# Patient Record
Sex: Female | Born: 1960
Health system: Southern US, Community
[De-identification: ages and names within clinical notes are randomized; demographics above are authoritative.]

## PROBLEM LIST (undated history)

## (undated) DIAGNOSIS — F329 Major depressive disorder, single episode, unspecified: Secondary | ICD-10-CM

## (undated) DIAGNOSIS — J302 Other seasonal allergic rhinitis: Secondary | ICD-10-CM

## (undated) DIAGNOSIS — Z8719 Personal history of other diseases of the digestive system: Secondary | ICD-10-CM

## (undated) DIAGNOSIS — F419 Anxiety disorder, unspecified: Secondary | ICD-10-CM

## (undated) DIAGNOSIS — E785 Hyperlipidemia, unspecified: Secondary | ICD-10-CM

## (undated) DIAGNOSIS — Z8709 Personal history of other diseases of the respiratory system: Secondary | ICD-10-CM

## (undated) DIAGNOSIS — E039 Hypothyroidism, unspecified: Secondary | ICD-10-CM

## (undated) DIAGNOSIS — I10 Essential (primary) hypertension: Secondary | ICD-10-CM

## (undated) DIAGNOSIS — Z803 Family history of malignant neoplasm of breast: Secondary | ICD-10-CM

## (undated) DIAGNOSIS — F32A Depression, unspecified: Secondary | ICD-10-CM

## (undated) DIAGNOSIS — Z923 Personal history of irradiation: Secondary | ICD-10-CM

## (undated) DIAGNOSIS — G4700 Insomnia, unspecified: Secondary | ICD-10-CM

## (undated) DIAGNOSIS — C50919 Malignant neoplasm of unspecified site of unspecified female breast: Secondary | ICD-10-CM

## (undated) DIAGNOSIS — Z8049 Family history of malignant neoplasm of other genital organs: Secondary | ICD-10-CM

## (undated) DIAGNOSIS — C801 Malignant (primary) neoplasm, unspecified: Secondary | ICD-10-CM

## (undated) DIAGNOSIS — K219 Gastro-esophageal reflux disease without esophagitis: Secondary | ICD-10-CM

## (undated) DIAGNOSIS — Z9221 Personal history of antineoplastic chemotherapy: Secondary | ICD-10-CM

## (undated) HISTORY — DX: Family history of malignant neoplasm of breast: Z80.3

## (undated) HISTORY — PX: ESOPHAGOGASTRODUODENOSCOPY: SHX1529

## (undated) HISTORY — PX: COLONOSCOPY: SHX174

## (undated) HISTORY — PX: BREAST BIOPSY: SHX20

## (undated) HISTORY — PX: TONSILLECTOMY: SUR1361

## (undated) HISTORY — DX: Family history of malignant neoplasm of other genital organs: Z80.49

---

## 1998-08-23 DIAGNOSIS — Z8709 Personal history of other diseases of the respiratory system: Secondary | ICD-10-CM

## 1998-08-23 HISTORY — DX: Personal history of other diseases of the respiratory system: Z87.09

## 1999-02-03 ENCOUNTER — Other Ambulatory Visit: Admission: RE | Admit: 1999-02-03 | Discharge: 1999-02-03 | Payer: Self-pay | Admitting: Obstetrics & Gynecology

## 2000-02-29 ENCOUNTER — Other Ambulatory Visit: Admission: RE | Admit: 2000-02-29 | Discharge: 2000-02-29 | Payer: Self-pay | Admitting: Obstetrics & Gynecology

## 2001-03-27 ENCOUNTER — Other Ambulatory Visit: Admission: RE | Admit: 2001-03-27 | Discharge: 2001-03-27 | Payer: Self-pay | Admitting: Obstetrics & Gynecology

## 2002-04-18 ENCOUNTER — Other Ambulatory Visit: Admission: RE | Admit: 2002-04-18 | Discharge: 2002-04-18 | Payer: Self-pay | Admitting: Obstetrics & Gynecology

## 2003-07-15 ENCOUNTER — Other Ambulatory Visit: Admission: RE | Admit: 2003-07-15 | Discharge: 2003-07-15 | Payer: Self-pay | Admitting: Obstetrics & Gynecology

## 2004-09-09 ENCOUNTER — Other Ambulatory Visit: Admission: RE | Admit: 2004-09-09 | Discharge: 2004-09-09 | Payer: Self-pay | Admitting: Obstetrics & Gynecology

## 2007-08-24 HISTORY — PX: HERNIA REPAIR: SHX51

## 2008-06-25 ENCOUNTER — Encounter: Admission: RE | Admit: 2008-06-25 | Discharge: 2008-06-25 | Payer: Self-pay | Admitting: General Surgery

## 2011-11-09 ENCOUNTER — Other Ambulatory Visit: Payer: Self-pay | Admitting: Internal Medicine

## 2011-11-09 DIAGNOSIS — K219 Gastro-esophageal reflux disease without esophagitis: Secondary | ICD-10-CM

## 2011-11-23 ENCOUNTER — Ambulatory Visit
Admission: RE | Admit: 2011-11-23 | Discharge: 2011-11-23 | Disposition: A | Discharge status: Home / Self Care | Payer: BC Managed Care – PPO | Source: Ambulatory Visit | Attending: Internal Medicine | Admitting: Internal Medicine

## 2011-11-23 ENCOUNTER — Other Ambulatory Visit: Payer: Self-pay | Admitting: Internal Medicine

## 2011-11-23 DIAGNOSIS — K219 Gastro-esophageal reflux disease without esophagitis: Secondary | ICD-10-CM

## 2014-02-28 ENCOUNTER — Encounter: Payer: Self-pay | Admitting: Podiatry

## 2014-02-28 ENCOUNTER — Ambulatory Visit (INDEPENDENT_AMBULATORY_CARE_PROVIDER_SITE_OTHER): Payer: BC Managed Care – PPO | Admitting: Podiatry

## 2014-02-28 VITALS — BP 124/69 | HR 85 | Resp 16 | Ht 68.0 in | Wt 207.0 lb

## 2014-02-28 DIAGNOSIS — L600 Ingrowing nail: Secondary | ICD-10-CM

## 2014-02-28 MED ORDER — NEOMYCIN-POLYMYXIN-HC 3.5-10000-1 OT SOLN: OTIC | Status: DC

## 2014-02-28 NOTE — Progress Notes (Signed)
   Subjective:    Patient ID: Tracy Hart, female    DOB: [birth year], 53 y.o.   MRN: [id number]  HPI Comments: "I have a bad toe"  Patient c/o aching 1st toe left, medial border, for a couple months. She has had ingrown toenail problems for years, even as a child. The area is swollen and red. She went to see PCP he Rx'd antibiotic. Some better. Has been trimming it out some herself.  Toe Pain       Review of Systems  HENT: Positive for sinus pressure.   Allergic/Immunologic: Positive for environmental allergies.  Psychiatric/Behavioral: The patient is nervous/anxious.   All other systems reviewed and are negative.      Objective:   Physical Exam: I have reviewed her past medical history medications allergies surgeries social history and review of systems. Pulses are strongly palpable bilateral. Neurologic sensorium is intact per Semmes-Weinstein monofilament. Deep tendon reflexes are brisk and equal bilateral. Muscle strength is 5 over 5 dorsiflexors plantar flexors inverters everters all intrinsic musculature is intact. Orthopedic evaluation demonstrates all joints distal to the ankle a full range of motion without crepitation. Cutaneous evaluation demonstrates a sharp incurvated nail margin along the tibial border of the hallux left. Hallux nail plate demonstrates onychocryptosis with erythema and edema and purulence along the tibial border.        Assessment & Plan:  Assessment: Ingrown nail paronychia abscess hallux left tibial border. Onychocryptosis hallux nail plate left.  Plan: Discussed etiology pathology conservative versus surgical therapies. She tolerated a tibial border matrixectomy well after local anesthesia was achieved. 3 applications of phenol were applied to the tibial matrix an a dressed a compressive dressing with Silvadene cream was applied. She was provided with home-going instructions for the care of her surgical time and I will followup with her in one  week.

## 2014-02-28 NOTE — Patient Instructions (Signed)

## 2014-03-12 ENCOUNTER — Encounter: Payer: Self-pay | Admitting: Podiatry

## 2014-03-12 ENCOUNTER — Ambulatory Visit (INDEPENDENT_AMBULATORY_CARE_PROVIDER_SITE_OTHER): Payer: BC Managed Care – PPO | Admitting: Podiatry

## 2014-03-12 DIAGNOSIS — L600 Ingrowing nail: Secondary | ICD-10-CM

## 2014-03-12 NOTE — Progress Notes (Signed)
She presents today one week status post matrixectomy tibial border hallux left. He continues to soak on a regular basis. Has discontinue Betadine start with Epsom salts. She states it feels much better had no pain whatsoever.  Objective: Vital signs are stable she is alert and oriented x3. Pulses are strongly palpable. No erythema edema cellulitis drainage or odor tibial border hallux left.  Assessment: Resolving matrixectomy appears to be healing well.  Plan: Continue soak Epsom salts warm water soaks until completely resolved continue her sport otic and covered in the day. Followup with me in a few days if necessary.

## 2014-08-23 DIAGNOSIS — C801 Malignant (primary) neoplasm, unspecified: Secondary | ICD-10-CM

## 2014-08-23 HISTORY — DX: Malignant (primary) neoplasm, unspecified: C80.1

## 2015-07-29 ENCOUNTER — Other Ambulatory Visit: Payer: Self-pay | Admitting: Obstetrics & Gynecology

## 2015-07-29 DIAGNOSIS — R928 Other abnormal and inconclusive findings on diagnostic imaging of breast: Secondary | ICD-10-CM

## 2015-08-04 ENCOUNTER — Ambulatory Visit
Admission: RE | Admit: 2015-08-04 | Discharge: 2015-08-04 | Disposition: A | Discharge status: Home / Self Care | Payer: BLUE CROSS/BLUE SHIELD | Source: Ambulatory Visit | Attending: Obstetrics & Gynecology | Admitting: Obstetrics & Gynecology

## 2015-08-04 ENCOUNTER — Other Ambulatory Visit: Payer: Self-pay | Admitting: Obstetrics & Gynecology

## 2015-08-04 DIAGNOSIS — R928 Other abnormal and inconclusive findings on diagnostic imaging of breast: Secondary | ICD-10-CM

## 2015-08-19 ENCOUNTER — Ambulatory Visit
Admission: RE | Admit: 2015-08-19 | Discharge: 2015-08-19 | Disposition: A | Discharge status: Home / Self Care | Payer: BLUE CROSS/BLUE SHIELD | Source: Ambulatory Visit | Attending: Obstetrics & Gynecology | Admitting: Obstetrics & Gynecology

## 2015-08-19 DIAGNOSIS — R928 Other abnormal and inconclusive findings on diagnostic imaging of breast: Secondary | ICD-10-CM

## 2015-08-19 DIAGNOSIS — C50919 Malignant neoplasm of unspecified site of unspecified female breast: Secondary | ICD-10-CM

## 2015-08-19 HISTORY — DX: Malignant neoplasm of unspecified site of unspecified female breast: C50.919

## 2015-08-21 ENCOUNTER — Other Ambulatory Visit: Payer: Self-pay | Admitting: Surgery

## 2015-08-21 DIAGNOSIS — C50911 Malignant neoplasm of unspecified site of right female breast: Secondary | ICD-10-CM

## 2015-08-22 ENCOUNTER — Telehealth: Payer: Self-pay | Admitting: Oncology

## 2015-08-22 ENCOUNTER — Other Ambulatory Visit: Payer: Self-pay | Admitting: Surgery

## 2015-08-22 DIAGNOSIS — C50911 Malignant neoplasm of unspecified site of right female breast: Secondary | ICD-10-CM

## 2015-08-22 NOTE — Telephone Encounter (Signed)
new breast appt-S/w patient and gave np appt for 01/11 @ 4 w/Dr. Jana Hakim Referring Dr. [name]  Referral information scanned

## 2015-08-24 HISTORY — PX: BREAST LUMPECTOMY: SHX2

## 2015-08-26 ENCOUNTER — Telehealth: Payer: Self-pay | Admitting: *Deleted

## 2015-08-26 NOTE — Telephone Encounter (Signed)
Mailed new pt packet to pt.  

## 2015-08-27 ENCOUNTER — Encounter (HOSPITAL_BASED_OUTPATIENT_CLINIC_OR_DEPARTMENT_OTHER)
Admission: RE | Admit: 2015-08-27 | Discharge: 2015-08-27 | Disposition: A | Discharge status: Home / Self Care | Payer: BLUE CROSS/BLUE SHIELD | Source: Ambulatory Visit | Attending: Surgery | Admitting: Surgery

## 2015-08-27 ENCOUNTER — Ambulatory Visit
Admission: RE | Admit: 2015-08-27 | Discharge: 2015-08-27 | Disposition: A | Discharge status: Home / Self Care | Payer: BLUE CROSS/BLUE SHIELD | Source: Ambulatory Visit | Attending: Surgery | Admitting: Surgery

## 2015-08-27 ENCOUNTER — Other Ambulatory Visit: Payer: Self-pay

## 2015-08-27 ENCOUNTER — Encounter (HOSPITAL_BASED_OUTPATIENT_CLINIC_OR_DEPARTMENT_OTHER): Payer: Self-pay | Admitting: *Deleted

## 2015-08-27 DIAGNOSIS — C50911 Malignant neoplasm of unspecified site of right female breast: Secondary | ICD-10-CM | POA: Insufficient documentation

## 2015-08-27 DIAGNOSIS — Z01818 Encounter for other preprocedural examination: Secondary | ICD-10-CM | POA: Insufficient documentation

## 2015-08-27 LAB — BASIC METABOLIC PANEL
ANION GAP: 8 (ref 5–15)
BUN: 12 mg/dL (ref 6–20)
CALCIUM: 9.5 mg/dL (ref 8.9–10.3)
CHLORIDE: 102 mmol/L (ref 101–111)
CO2: 28 mmol/L (ref 22–32)
CREATININE: 0.61 mg/dL (ref 0.44–1.00)
GFR calc non Af Amer: >60 mL/min (ref >60)
Glucose, Bld: 163 mg/dL — ABNORMAL HIGH (ref 65–99)
Potassium: 4.5 mmol/L (ref 3.5–5.1)
SODIUM: 138 mmol/L (ref 135–145)

## 2015-08-31 NOTE — H&P (Signed)
Tracy Hart 08/21/2015 11:29 AM Location: [facility] Patient #: [id number] DOB: February 16, 1961 Married / Language: Tracy Hart / Race: White Female   History of Present Illness ([name] MD; 08/21/2015 12:08 PM) Patient words: New-breast cancer.  The patient is a 55 year old female who presents with breast cancer. This is a pleasant female referred by Dr. Michiel Cowboy after the recent diagnosis of a right breast cancer. She had undergone screening mammography several weeks ago and a small 8 mm area was identified in the right breast in the upper outer quadrant. She had a stereotactic biopsy 2 days ago and this has revealed invasive mammary carcinoma. Receptor status is currently pending. She has no previous history of breast cancer or breast biopsies. She is otherwise healthy without complaints. Her mother had breast cancer at almost age 52 and her sisters had thyroid cancer.   Other Problems Malachi Bonds, CMA; 08/21/2015 11:29 AM) Anxiety Disorder Breast Cancer Gastroesophageal Reflux Disease High blood pressure Hypercholesterolemia Inguinal Hernia Thyroid Disease  Past Surgical History Malachi Bonds, CMA; 08/21/2015 11:29 AM) Open Inguinal Hernia Surgery Right. Tonsillectomy  Diagnostic Studies History Malachi Bonds, CMA; 08/21/2015 11:29 AM) Colonoscopy 5-10 years ago Mammogram within last year Pap Smear 1-5 years ago  Allergies Malachi Bonds, CMA; 08/21/2015 11:30 AM) Codeine Sulfate *ANALGESICS - OPIOID* Sulfamethoxazole *Sulfonamides  Medication History (Chemira Jones, CMA; 08/21/2015 11:32 AM) ALPRAZolam (0.5MG  Tablet, Oral) Active. Atorvastatin Calcium (40MG  Tablet, Oral) Active. Benicar HCT (40-25MG  Tablet, Oral) Active. Escitalopram Oxalate (10MG  Tablet, Oral) Active. Estradiol (0.05MG /24HR Patch TW, Transdermal) Active. Levothyroxine Sodium (137MCG Tablet, Oral) Active. Progesterone Micronized (100MG  Capsule,  Oral) Active. Fish Oil (1200MG  Capsule, Oral) Active. Medications Reconciled Aspirin (81MG  Tablet, Oral) Active.  Social History Malachi Bonds, CMA; 08/21/2015 11:29 AM) Alcohol use Occasional alcohol use. Caffeine use Carbonated beverages, Coffee. No drug use Tobacco use Former smoker.  Family History Malachi Bonds, CMA; 08/21/2015 11:29 AM) Breast Cancer Mother. Cancer Sister. Diabetes Mellitus Mother. Hypertension Father, Mother, Sister. Seizure disorder Sister. Thyroid problems Sister.  Pregnancy / Birth History Malachi Bonds, CMA; 08/21/2015 11:29 AM) Age at menarche 29 years. Age of menopause 49-55 Contraceptive History Oral contraceptives. Gravida 0 Irregular periods Para 0    Review of Systems (Chemira Jones CMA; 08/21/2015 11:29 AM) General Not Present- Appetite Loss, Chills, Fatigue, Fever, Night Sweats, Weight Gain and Weight Loss. Skin Not Present- Change in Wart/Mole, Dryness, Hives, Jaundice, New Lesions, Non-Healing Wounds, Rash and Ulcer. HEENT Not Present- Earache, Hearing Loss, Hoarseness, Nose Bleed, Oral Ulcers, Ringing in the Ears, Seasonal Allergies, Sinus Pain, Sore Throat, Visual Disturbances, Wears glasses/contact lenses and Yellow Eyes. Respiratory Not Present- Bloody sputum, Chronic Cough, Difficulty Breathing, Snoring and Wheezing. Breast Present- Breast Mass. Not Present- Breast Pain, Nipple Discharge and Skin Changes. Cardiovascular Not Present- Chest Pain, Difficulty Breathing Lying Down, Leg Cramps, Palpitations, Rapid Heart Rate, Shortness of Breath and Swelling of Extremities. Gastrointestinal Not Present- Abdominal Pain, Bloating, Bloody Stool, Change in Bowel Habits, Chronic diarrhea, Constipation, Difficulty Swallowing, Excessive gas, Gets full quickly at meals, Hemorrhoids, Indigestion, Nausea, Rectal Pain and Vomiting. Female Genitourinary Not Present- Frequency, Nocturia, Painful Urination, Pelvic Pain and  Urgency. Musculoskeletal Not Present- Back Pain, Joint Pain, Joint Stiffness, Muscle Pain, Muscle Weakness and Swelling of Extremities. Neurological Not Present- Decreased Memory, Fainting, Headaches, Numbness, Seizures, Tingling, Tremor, Trouble walking and Weakness. Psychiatric Not Present- Anxiety, Bipolar, Change in Sleep Pattern, Depression, Fearful and Frequent crying. Endocrine Not Present- Cold Intolerance, Excessive Hunger, Hair Changes, Heat Intolerance, Hot flashes and New Diabetes.  Hematology Not Present- Easy Bruising, Excessive bleeding, Gland problems, HIV and Persistent Infections.  Vitals (Chemira Jones CMA; 08/21/2015 11:30 AM) 08/21/2015 11:30 AM Weight: 212 lb Height: 68in Body Surface Area: 2.1 m Body Mass Index: 32.23 kg/m  Temp.: 98.37F(Oral)  Pulse: 96 (Regular)  BP: 140/90 (Sitting, Left Arm, Standard)       Physical Exam (Annette Liotta A. Ninfa Linden MD; 08/21/2015 12:08 PM) General Mental Status-Alert. General Appearance-Consistent with stated age. Hydration-Well hydrated. Voice-Normal.  Head and Neck Head-normocephalic, atraumatic with no lesions or palpable masses. Trachea-midline. Thyroid Gland Characteristics - normal size and consistency.  Eye Eyeball - Bilateral-Extraocular movements intact. Sclera/Conjunctiva - Bilateral-No scleral icterus.  Chest and Lung Exam Chest and lung exam reveals -quiet, even and easy respiratory effort with no use of accessory muscles and on auscultation, normal breath sounds, no adventitious sounds and normal vocal resonance. Inspection Chest Wall - Normal. Back - normal.  Breast Breast - Left-Symmetric, Non Tender, No Biopsy scars, no Dimpling, No Inflammation, No Lumpectomy scars, No Mastectomy scars, No Peau d' Orange. Breast - Right-Symmetric, Non Tender, No Biopsy scars, no Dimpling, No Inflammation, No Lumpectomy scars, No Mastectomy scars, No Peau d' Orange. Breast Lump-No  Palpable Breast Mass.  Cardiovascular Cardiovascular examination reveals -normal heart sounds, regular rate and rhythm with no murmurs and normal pedal pulses bilaterally.  Abdomen Inspection Inspection of the abdomen reveals - No Hernias. Skin - Scar - no surgical scars. Palpation/Percussion Palpation and Percussion of the abdomen reveal - Soft, Non Tender, No Rebound tenderness, No Rigidity (guarding) and No hepatosplenomegaly. Auscultation Auscultation of the abdomen reveals - Bowel sounds normal.  Neurologic Neurologic evaluation reveals -alert and oriented x 3 with no impairment of recent or remote memory. Mental Status-Normal.  Musculoskeletal Normal Exam - Left-Upper Extremity Strength Normal and Lower Extremity Strength Normal. Normal Exam - Right-Upper Extremity Strength Normal and Lower Extremity Strength Normal.  Lymphatic Head & Neck  General Head & Neck Lymphatics: Bilateral - Description - Normal. Axillary  General Axillary Region: Bilateral - Description - Normal. Tenderness - Non Tender. Femoral & Inguinal  Generalized Femoral & Inguinal Lymphatics: Bilateral - Description - Normal. Tenderness - Non Tender.    Assessment & Plan (Nimsi Males A. Ninfa Linden MD; 08/21/2015 12:10 PM) BREAST CANCER, STAGE 1, RIGHT (C50.911) Impression: I discussed the diagnosis with her in detail. I discussed surgery for breast cancer. I discussed breast conservation versus mastectomy. She is interested in breast conservation. I discussed right breast radioactive seed localized lumpectomy with sentinel lymph node biopsy. I discussed preoperative referral to medical and radiation oncology as well as genetics. At this point, she was to go ahead and schedule surgery ASAP. She has no children of her own. I discussed right breast radioactive seed localization with sentinel node biopsy in detail. I discussed the risks of surgery which includes but is not limited to bleeding, infection,  need for further surgery if margins or nodes are positive, cardiopulmonary issues, DVT, postoperative recovery, etc. She understands and wishes to proceed with surgery which will be scheduled along with the referrals    Signed by Harl Bowie, MD (08/21/2015 12:10 PM)

## 2015-09-01 ENCOUNTER — Ambulatory Visit (HOSPITAL_BASED_OUTPATIENT_CLINIC_OR_DEPARTMENT_OTHER)
Admission: RE | Admit: 2015-09-01 | Discharge: 2015-09-01 | Disposition: A | Discharge status: Home / Self Care | Payer: BLUE CROSS/BLUE SHIELD | Source: Ambulatory Visit | Attending: Surgery | Admitting: Surgery

## 2015-09-01 ENCOUNTER — Encounter (HOSPITAL_BASED_OUTPATIENT_CLINIC_OR_DEPARTMENT_OTHER)
Admission: RE | Disposition: A | Discharge status: Home / Self Care | Payer: Self-pay | Source: Ambulatory Visit | Attending: Surgery

## 2015-09-01 ENCOUNTER — Ambulatory Visit (HOSPITAL_COMMUNITY)
Admission: RE | Admit: 2015-09-01 | Discharge: 2015-09-01 | Disposition: A | Discharge status: Home / Self Care | Payer: BLUE CROSS/BLUE SHIELD | Source: Ambulatory Visit | Attending: Surgery | Admitting: Surgery

## 2015-09-01 ENCOUNTER — Encounter (HOSPITAL_BASED_OUTPATIENT_CLINIC_OR_DEPARTMENT_OTHER): Payer: Self-pay | Admitting: Anesthesiology

## 2015-09-01 ENCOUNTER — Ambulatory Visit (HOSPITAL_BASED_OUTPATIENT_CLINIC_OR_DEPARTMENT_OTHER): Payer: BLUE CROSS/BLUE SHIELD | Admitting: Anesthesiology

## 2015-09-01 ENCOUNTER — Ambulatory Visit
Admission: RE | Admit: 2015-09-01 | Discharge: 2015-09-01 | Disposition: A | Discharge status: Home / Self Care | Payer: BLUE CROSS/BLUE SHIELD | Source: Ambulatory Visit | Attending: Surgery | Admitting: Surgery

## 2015-09-01 DIAGNOSIS — Z7982 Long term (current) use of aspirin: Secondary | ICD-10-CM | POA: Insufficient documentation

## 2015-09-01 DIAGNOSIS — Z79890 Hormone replacement therapy (postmenopausal): Secondary | ICD-10-CM | POA: Diagnosis not present

## 2015-09-01 DIAGNOSIS — I10 Essential (primary) hypertension: Secondary | ICD-10-CM | POA: Insufficient documentation

## 2015-09-01 DIAGNOSIS — E7800 Pure hypercholesterolemia, unspecified: Secondary | ICD-10-CM | POA: Insufficient documentation

## 2015-09-01 DIAGNOSIS — Z87891 Personal history of nicotine dependence: Secondary | ICD-10-CM | POA: Insufficient documentation

## 2015-09-01 DIAGNOSIS — C50411 Malignant neoplasm of upper-outer quadrant of right female breast: Secondary | ICD-10-CM | POA: Diagnosis not present

## 2015-09-01 DIAGNOSIS — C50911 Malignant neoplasm of unspecified site of right female breast: Secondary | ICD-10-CM

## 2015-09-01 DIAGNOSIS — E039 Hypothyroidism, unspecified: Secondary | ICD-10-CM | POA: Insufficient documentation

## 2015-09-01 DIAGNOSIS — K219 Gastro-esophageal reflux disease without esophagitis: Secondary | ICD-10-CM | POA: Diagnosis not present

## 2015-09-01 DIAGNOSIS — Z803 Family history of malignant neoplasm of breast: Secondary | ICD-10-CM | POA: Insufficient documentation

## 2015-09-01 DIAGNOSIS — Z170 Estrogen receptor positive status [ER+]: Secondary | ICD-10-CM | POA: Diagnosis not present

## 2015-09-01 HISTORY — PX: BREAST LUMPECTOMY WITH RADIOACTIVE SEED AND SENTINEL LYMPH NODE BIOPSY: SHX6550

## 2015-09-01 HISTORY — DX: Hypothyroidism, unspecified: E03.9

## 2015-09-01 HISTORY — DX: Malignant (primary) neoplasm, unspecified: C80.1

## 2015-09-01 HISTORY — DX: Depression, unspecified: F32.A

## 2015-09-01 HISTORY — DX: Major depressive disorder, single episode, unspecified: F32.9

## 2015-09-01 HISTORY — DX: Essential (primary) hypertension: I10

## 2015-09-01 HISTORY — DX: Anxiety disorder, unspecified: F41.9

## 2015-09-01 SURGERY — BREAST LUMPECTOMY WITH RADIOACTIVE SEED AND SENTINEL LYMPH NODE BIOPSY
Anesthesia: General | Site: Breast | Laterality: Right

## 2015-09-01 MED ORDER — BUPIVACAINE-EPINEPHRINE (PF) 0.5% -1:200000 IJ SOLN
INTRAMUSCULAR | Status: AC
  Filled 2015-09-01: qty 30

## 2015-09-01 MED ORDER — MIDAZOLAM HCL 2 MG/2ML IJ SOLN
1.0000 mg | INTRAMUSCULAR | Status: DC | PRN
  Administered 2015-09-01 (×2): 2 mg via INTRAVENOUS

## 2015-09-01 MED ORDER — FENTANYL CITRATE (PF) 100 MCG/2ML IJ SOLN
50.0000 ug | INTRAMUSCULAR | Status: AC | PRN
  Administered 2015-09-01: 50 ug via INTRAVENOUS
  Administered 2015-09-01 (×4): 25 ug via INTRAVENOUS
  Administered 2015-09-01 (×3): 50 ug via INTRAVENOUS

## 2015-09-01 MED ORDER — MIDAZOLAM HCL 2 MG/2ML IJ SOLN
INTRAMUSCULAR | Status: AC
  Filled 2015-09-01: qty 2

## 2015-09-01 MED ORDER — EPHEDRINE SULFATE 50 MG/ML IJ SOLN
INTRAMUSCULAR | Status: AC
  Filled 2015-09-01: qty 1

## 2015-09-01 MED ORDER — KETOROLAC TROMETHAMINE 30 MG/ML IJ SOLN
INTRAMUSCULAR | Status: DC | PRN
  Administered 2015-09-01: 30 mg via INTRAVENOUS

## 2015-09-01 MED ORDER — BUPIVACAINE-EPINEPHRINE 0.5% -1:200000 IJ SOLN
INTRAMUSCULAR | Status: DC | PRN
  Administered 2015-09-01: 7 mL

## 2015-09-01 MED ORDER — ARTIFICIAL TEARS OP OINT
TOPICAL_OINTMENT | OPHTHALMIC | Status: AC
  Filled 2015-09-01: qty 3.5

## 2015-09-01 MED ORDER — METHYLENE BLUE 1 % INJ SOLN
INTRAMUSCULAR | Status: AC
  Filled 2015-09-01: qty 10

## 2015-09-01 MED ORDER — ONDANSETRON HCL 4 MG/2ML IJ SOLN
INTRAMUSCULAR | Status: AC
  Filled 2015-09-01: qty 2

## 2015-09-01 MED ORDER — OXYCODONE HCL 5 MG PO TABS
ORAL_TABLET | ORAL | Status: AC
  Filled 2015-09-01: qty 1

## 2015-09-01 MED ORDER — LIDOCAINE HCL (CARDIAC) 20 MG/ML IV SOLN
INTRAVENOUS | Status: AC
  Filled 2015-09-01: qty 5

## 2015-09-01 MED ORDER — SODIUM CHLORIDE 0.9 % IJ SOLN: 3.0000 mL | Freq: Two times a day (BID) | INTRAMUSCULAR | Status: DC

## 2015-09-01 MED ORDER — GLYCOPYRROLATE 0.2 MG/ML IJ SOLN: 0.2000 mg | Freq: Once | INTRAMUSCULAR | Status: DC | PRN

## 2015-09-01 MED ORDER — FENTANYL CITRATE (PF) 100 MCG/2ML IJ SOLN
INTRAMUSCULAR | Status: AC
  Filled 2015-09-01: qty 2

## 2015-09-01 MED ORDER — SODIUM CHLORIDE 0.9 % IJ SOLN: 3.0000 mL | INTRAMUSCULAR | Status: DC | PRN

## 2015-09-01 MED ORDER — CEFAZOLIN SODIUM-DEXTROSE 2-3 GM-% IV SOLR
2.0000 g | INTRAVENOUS | Status: AC
Start: 2015-09-01 — End: 2015-09-01
  Administered 2015-09-01: 2 g via INTRAVENOUS

## 2015-09-01 MED ORDER — PROMETHAZINE HCL 25 MG/ML IJ SOLN: 6.2500 mg | INTRAMUSCULAR | Status: DC | PRN

## 2015-09-01 MED ORDER — LIDOCAINE HCL (CARDIAC) 20 MG/ML IV SOLN
INTRAVENOUS | Status: DC | PRN
  Administered 2015-09-01: 30 mg via INTRAVENOUS

## 2015-09-01 MED ORDER — SODIUM CHLORIDE 0.9 % IV SOLN: 250.0000 mL | INTRAVENOUS | Status: DC | PRN

## 2015-09-01 MED ORDER — ATROPINE SULFATE 0.4 MG/ML IJ SOLN
INTRAMUSCULAR | Status: AC
  Filled 2015-09-01: qty 1

## 2015-09-01 MED ORDER — GLYCOPYRROLATE 0.2 MG/ML IJ SOLN
INTRAMUSCULAR | Status: AC
  Filled 2015-09-01: qty 1

## 2015-09-01 MED ORDER — ONDANSETRON HCL 4 MG/2ML IJ SOLN
INTRAMUSCULAR | Status: DC | PRN
  Administered 2015-09-01: 4 mg via INTRAVENOUS

## 2015-09-01 MED ORDER — HYDROMORPHONE HCL 1 MG/ML IJ SOLN
INTRAMUSCULAR | Status: AC
  Filled 2015-09-01: qty 1

## 2015-09-01 MED ORDER — ACETAMINOPHEN 650 MG RE SUPP: 650.0000 mg | RECTAL | Status: DC | PRN

## 2015-09-01 MED ORDER — ACETAMINOPHEN 325 MG PO TABS: 650.0000 mg | ORAL_TABLET | ORAL | Status: DC | PRN

## 2015-09-01 MED ORDER — TECHNETIUM TC 99M SULFUR COLLOID FILTERED
1.0000 | Freq: Once | INTRAVENOUS | Status: AC | PRN
  Administered 2015-09-01: 1 via INTRADERMAL

## 2015-09-01 MED ORDER — SODIUM CHLORIDE 0.9 % IJ SOLN
INTRAMUSCULAR | Status: AC
  Filled 2015-09-01: qty 10

## 2015-09-01 MED ORDER — LACTATED RINGERS IV SOLN
INTRAVENOUS | Status: DC
  Administered 2015-09-01 (×2): via INTRAVENOUS

## 2015-09-01 MED ORDER — EPHEDRINE SULFATE 50 MG/ML IJ SOLN
INTRAMUSCULAR | Status: DC | PRN
  Administered 2015-09-01: 10 mg via INTRAVENOUS

## 2015-09-01 MED ORDER — OXYCODONE HCL 5 MG PO TABS
5.0000 mg | ORAL_TABLET | Freq: Once | ORAL | Status: AC
  Administered 2015-09-01: 5 mg via ORAL

## 2015-09-01 MED ORDER — DEXAMETHASONE SODIUM PHOSPHATE 4 MG/ML IJ SOLN
INTRAMUSCULAR | Status: DC | PRN
  Administered 2015-09-01: 10 mg via INTRAVENOUS

## 2015-09-01 MED ORDER — KETOROLAC TROMETHAMINE 30 MG/ML IJ SOLN
INTRAMUSCULAR | Status: AC
  Filled 2015-09-01: qty 1

## 2015-09-01 MED ORDER — PROPOFOL 10 MG/ML IV BOLUS
INTRAVENOUS | Status: AC
  Filled 2015-09-01: qty 40

## 2015-09-01 MED ORDER — SODIUM CHLORIDE 0.9 % IJ SOLN
INTRAMUSCULAR | Status: DC | PRN
  Administered 2015-09-01: 5 mL via INTRADERMAL

## 2015-09-01 MED ORDER — FENTANYL CITRATE (PF) 100 MCG/2ML IJ SOLN: 25.0000 ug | INTRAMUSCULAR | Status: DC | PRN

## 2015-09-01 MED ORDER — SCOPOLAMINE 1 MG/3DAYS TD PT72: 1.0000 | MEDICATED_PATCH | Freq: Once | TRANSDERMAL | Status: DC | PRN

## 2015-09-01 MED ORDER — DEXAMETHASONE SODIUM PHOSPHATE 10 MG/ML IJ SOLN
INTRAMUSCULAR | Status: AC
  Filled 2015-09-01: qty 1

## 2015-09-01 MED ORDER — SUCCINYLCHOLINE CHLORIDE 20 MG/ML IJ SOLN
INTRAMUSCULAR | Status: AC
  Filled 2015-09-01: qty 1

## 2015-09-01 MED ORDER — PHENYLEPHRINE HCL 10 MG/ML IJ SOLN
INTRAMUSCULAR | Status: AC
  Filled 2015-09-01: qty 1

## 2015-09-01 MED ORDER — TRAMADOL HCL 50 MG PO TABS: 50.0000 mg | ORAL_TABLET | Freq: Four times a day (QID) | ORAL | Status: DC | PRN

## 2015-09-01 MED ORDER — CEFAZOLIN SODIUM-DEXTROSE 2-3 GM-% IV SOLR
INTRAVENOUS | Status: AC
  Filled 2015-09-01: qty 50

## 2015-09-01 MED ORDER — PROPOFOL 10 MG/ML IV BOLUS
INTRAVENOUS | Status: DC | PRN
  Administered 2015-09-01: 200 mg via INTRAVENOUS

## 2015-09-01 MED ORDER — HYDROMORPHONE HCL 1 MG/ML IJ SOLN
0.2500 mg | INTRAMUSCULAR | Status: DC | PRN
  Administered 2015-09-01 (×4): 0.5 mg via INTRAVENOUS

## 2015-09-01 SURGICAL SUPPLY — 48 items
APPLIER CLIP 9.375 MED OPEN (MISCELLANEOUS)
BINDER BREAST XLRG (GAUZE/BANDAGES/DRESSINGS) ×3 IMPLANT
BLADE HEX COATED 2.75 (ELECTRODE) ×3 IMPLANT
BLADE SURG 15 STRL LF DISP TIS (BLADE) ×1 IMPLANT
BLADE SURG 15 STRL SS (BLADE) ×2
CANISTER SUCT 1200ML W/VALVE (MISCELLANEOUS) ×3 IMPLANT
CHLORAPREP W/TINT 26ML (MISCELLANEOUS) ×3 IMPLANT
CLIP APPLIE 9.375 MED OPEN (MISCELLANEOUS) IMPLANT
CLIP TI WIDE RED SMALL 6 (CLIP) ×3 IMPLANT
COVER BACK TABLE 60X90IN (DRAPES) ×3 IMPLANT
COVER MAYO STAND STRL (DRAPES) ×3 IMPLANT
COVER PROBE W GEL 5X96 (DRAPES) ×3 IMPLANT
DECANTER SPIKE VIAL GLASS SM (MISCELLANEOUS) IMPLANT
DEVICE DUBIN W/COMP PLATE 8390 (MISCELLANEOUS) ×3 IMPLANT
DRAPE LAPAROSCOPIC ABDOMINAL (DRAPES) ×3 IMPLANT
DRAPE UTILITY XL STRL (DRAPES) ×3 IMPLANT
ELECT REM PT RETURN 9FT ADLT (ELECTROSURGICAL) ×3
ELECTRODE REM PT RTRN 9FT ADLT (ELECTROSURGICAL) ×1 IMPLANT
GLOVE BIOGEL PI IND STRL 7.0 (GLOVE) ×1 IMPLANT
GLOVE BIOGEL PI INDICATOR 7.0 (GLOVE) ×2
GLOVE EXAM NITRILE LRG STRL (GLOVE) ×3 IMPLANT
GLOVE SURG SIGNA 7.5 PF LTX (GLOVE) ×3 IMPLANT
GLOVE SURG SS PI 6.5 STRL IVOR (GLOVE) ×3 IMPLANT
GOWN STRL REUS W/ TWL LRG LVL3 (GOWN DISPOSABLE) ×1 IMPLANT
GOWN STRL REUS W/ TWL XL LVL3 (GOWN DISPOSABLE) ×1 IMPLANT
GOWN STRL REUS W/TWL LRG LVL3 (GOWN DISPOSABLE) ×2
GOWN STRL REUS W/TWL XL LVL3 (GOWN DISPOSABLE) ×2
KIT MARKER MARGIN INK (KITS) ×3 IMPLANT
LIQUID BAND (GAUZE/BANDAGES/DRESSINGS) ×3 IMPLANT
NDL SAFETY ECLIPSE 18X1.5 (NEEDLE) ×1 IMPLANT
NEEDLE HYPO 18GX1.5 SHARP (NEEDLE) ×2
NEEDLE HYPO 25X1 1.5 SAFETY (NEEDLE) ×3 IMPLANT
NS IRRIG 1000ML POUR BTL (IV SOLUTION) ×6 IMPLANT
PACK BASIN DAY SURGERY FS (CUSTOM PROCEDURE TRAY) ×3 IMPLANT
PENCIL BUTTON HOLSTER BLD 10FT (ELECTRODE) ×3 IMPLANT
SLEEVE SCD COMPRESS KNEE MED (MISCELLANEOUS) ×3 IMPLANT
SPONGE GAUZE 4X4 12PLY STER LF (GAUZE/BANDAGES/DRESSINGS) IMPLANT
SPONGE LAP 4X18 X RAY DECT (DISPOSABLE) ×3 IMPLANT
SUT MNCRL AB 4-0 PS2 18 (SUTURE) ×3 IMPLANT
SUT SILK 2 0 SH (SUTURE) IMPLANT
SUT VIC AB 3-0 SH 27 (SUTURE) ×2
SUT VIC AB 3-0 SH 27X BRD (SUTURE) ×1 IMPLANT
SYR CONTROL 10ML LL (SYRINGE) ×6 IMPLANT
TOWEL OR 17X24 6PK STRL BLUE (TOWEL DISPOSABLE) ×3 IMPLANT
TOWEL OR NON WOVEN STRL DISP B (DISPOSABLE) IMPLANT
TUBE CONNECTING 20'X1/4 (TUBING) ×1
TUBE CONNECTING 20X1/4 (TUBING) ×2 IMPLANT
YANKAUER SUCT BULB TIP NO VENT (SUCTIONS) ×3 IMPLANT

## 2015-09-01 NOTE — Interval H&P Note (Signed)
History and Physical Interval Note:no change in H and P  09/01/2015 10:36 AM  [name]  has presented today for surgery, with the diagnosis of Right breast cancer  The various methods of treatment have been discussed with the patient and family. After consideration of risks, benefits and other options for treatment, the patient has consented to  Procedure(s): RIGHT BREAST LUMPECTOMY WITH RADIOACTIVE SEED LOCALIZED AND SENTINEL LYMPH NODE BIOPSY (Right) as a surgical intervention .  The patient's history has been reviewed, patient examined, no change in status, stable for surgery.  I have reviewed the patient's chart and labs.  Questions were answered to the patient's satisfaction.     Alicha Raspberry A

## 2015-09-01 NOTE — Anesthesia Procedure Notes (Signed)
Procedure Name: LMA Insertion Date/Time: 09/01/2015 10:53 AM Performed by: Toula Moos L Pre-anesthesia Checklist: Patient identified, Emergency Drugs available, Suction available and Patient being monitored Patient Re-evaluated:Patient Re-evaluated prior to inductionOxygen Delivery Method: Circle System Utilized Preoxygenation: Pre-oxygenation with 100% oxygen Intubation Type: IV induction Ventilation: Mask ventilation without difficulty LMA: LMA inserted LMA Size: 4.0 Number of attempts: 1 Airway Equipment and Method: Bite block Placement Confirmation: positive ETCO2 Tube secured with: Tape Dental Injury: Teeth and Oropharynx as per pre-operative assessment

## 2015-09-01 NOTE — Op Note (Signed)
RIGHT BREAST LUMPECTOMY WITH RADIOACTIVE SEED LOCALIZED AND SENTINEL LYMPH NODE BIOPSY  Procedure Note  Tracy Hart 09/01/2015   Pre-op Diagnosis: Right breast cancer     Post-op Diagnosis: same  Procedure(s): RIGHT BREAST PARTIAL MASTECTOMY WITH RADIOACTIVE SEED LOCALIZED AND SENTINEL LYMPH NODE BIOPSY  Surgeon(s): [name], MD  Anesthesia: General  Staff:  Circulator: Lisbeth Ply, RN Scrub Person: Lynelle Doctor, RN  Estimated Blood Loss: Minimal               Specimens: SENT TO PATH.            Kahla Risdon A   Date: 09/01/2015  Time: 11:53 AM

## 2015-09-01 NOTE — Anesthesia Preprocedure Evaluation (Signed)
Anesthesia Evaluation  Patient identified by MRN, date of birth, ID band Patient awake    Reviewed: Allergy & Precautions, NPO status , Patient's Chart, lab work & pertinent test results  Airway Mallampati: II  TM Distance: >3 FB Neck ROM: Full    Dental no notable dental hx.    Pulmonary neg pulmonary ROS,    Pulmonary exam normal breath sounds clear to auscultation       Cardiovascular hypertension, Pt. on medications Normal cardiovascular exam Rhythm:Regular Rate:Normal     Neuro/Psych negative neurological ROS  negative psych ROS   GI/Hepatic negative GI ROS, Neg liver ROS,   Endo/Other  Hypothyroidism   Renal/GU negative Renal ROS  negative genitourinary   Musculoskeletal negative musculoskeletal ROS (+)   Abdominal   Peds negative pediatric ROS (+)  Hematology negative hematology ROS (+)   Anesthesia Other Findings   Reproductive/Obstetrics negative OB ROS                             Anesthesia Physical Anesthesia Plan  ASA: II  Anesthesia Plan: General   Post-op Pain Management: GA combined w/ Regional for post-op pain   Induction: Intravenous  Airway Management Planned: LMA  Additional Equipment:   Intra-op Plan:   Post-operative Plan: Extubation in OR  Informed Consent: I have reviewed the patients History and Physical, chart, labs and discussed the procedure including the risks, benefits and alternatives for the proposed anesthesia with the patient or authorized representative who has indicated his/her understanding and acceptance.   Dental advisory given  Plan Discussed with: CRNA and Surgeon  Anesthesia Plan Comments:         Anesthesia Quick Evaluation

## 2015-09-01 NOTE — Progress Notes (Signed)
Assisted nuc med tech # [id number] with nuc med inj. Side rails up, monitors on throughout procedure. See vital signs in flow sheet. Tolerated Procedure well. 

## 2015-09-01 NOTE — Discharge Instructions (Signed)
[facility] °Office Phone Number [phone] ° °BREAST BIOPSY/ PARTIAL MASTECTOMY: POST OP INSTRUCTIONS ° °Always review your discharge instruction sheet given to you by the facility where your surgery was performed. ° °IF YOU HAVE DISABILITY OR FAMILY LEAVE FORMS, YOU MUST BRING THEM TO THE OFFICE FOR PROCESSING.  DO NOT GIVE THEM TO YOUR DOCTOR. ° °1. A prescription for pain medication may be given to you upon discharge.  Take your pain medication as prescribed, if needed.  If narcotic pain medicine is not needed, then you may take acetaminophen (Tylenol) or ibuprofen (Advil) as needed. °2. Take your usually prescribed medications unless otherwise directed °3. If you need a refill on your pain medication, please contact your pharmacy.  They will contact our office to request authorization.  Prescriptions will not be filled after 5pm or on week-ends. °4. You should eat very light the first 24 hours after surgery, such as soup, crackers, pudding, etc.  Resume your normal diet the day after surgery. °5. Most patients will experience some swelling and bruising in the breast.  Ice packs and a good support bra will help.  Swelling and bruising can take several days to resolve.  °6. It is common to experience some constipation if taking pain medication after surgery.  Increasing fluid intake and taking a stool softener will usually help or prevent this problem from occurring.  A mild laxative (Milk of Magnesia or Miralax) should be taken according to package directions if there are no bowel movements after 48 hours. °7. Unless discharge instructions indicate otherwise, you may remove your bandages 24-48 hours after surgery, and you may shower at that time.  You may have steri-strips (small skin tapes) in place directly over the incision.  These strips should be left on the skin for 7-10 days.  If your surgeon used skin glue on the incision, you may shower in 24 hours.  The glue will flake off over the  next 2-3 weeks.  Any sutures or staples will be removed at the office during your follow-up visit. °8. ACTIVITIES:  You may resume regular daily activities (gradually increasing) beginning the next day.  Wearing a good support bra or sports bra minimizes pain and swelling.  You may have sexual intercourse when it is comfortable. °a. You may drive when you no longer are taking prescription pain medication, you can comfortably wear a seatbelt, and you can safely maneuver your car and apply brakes. °b. RETURN TO WORK:  ______________________________________________________________________________________ °9. You should see your doctor in the office for a follow-up appointment approximately two weeks after your surgery.  Your doctor’s nurse will typically make your follow-up appointment when she calls you with your pathology report.  Expect your pathology report 2-3 business days after your surgery.  You may call to check if you do not hear from us after three days. °10. OTHER INSTRUCTIONS: _______________________________________________________________________________________________ _____________________________________________________________________________________________________________________________________ °_____________________________________________________________________________________________________________________________________ °_____________________________________________________________________________________________________________________________________ ° °WHEN TO CALL YOUR DOCTOR: °1. Fever over 101.0 °2. Nausea and/or vomiting. °3. Extreme swelling or bruising. °4. Continued bleeding from incision. °5. Increased pain, redness, or drainage from the incision. ° °The clinic staff is available to answer your questions during regular business hours.  Please don’t hesitate to call and ask to speak to one of the nurses for clinical concerns.  If you have a medical emergency, go to the nearest  emergency room or call 911.  A surgeon from [facility] is always on call at the hospital. ° °For further questions, please visit [email or web address]  ° ° ° °  Post Anesthesia Home Care Instructions ° °Activity: °Get plenty of rest for the remainder of the day. A responsible adult should stay with you for 24 hours following the procedure.  °For the next 24 hours, DO NOT: °-Drive a car °-Operate machinery °-Drink alcoholic beverages °-Take any medication unless instructed by your physician °-Make any legal decisions or sign important papers. ° °Meals: °Start with liquid foods such as gelatin or soup. Progress to regular foods as tolerated. Avoid greasy, spicy, heavy foods. If nausea and/or vomiting occur, drink only clear liquids until the nausea and/or vomiting subsides. Call your physician if vomiting continues. ° °Special Instructions/Symptoms: °Your throat may feel dry or sore from the anesthesia or the breathing tube placed in your throat during surgery. If this causes discomfort, gargle with warm salt water. The discomfort should disappear within 24 hours. ° °If you had a scopolamine patch placed behind your ear for the management of post- operative nausea and/or vomiting: ° °1. The medication in the patch is effective for 72 hours, after which it should be removed.  Wrap patch in a tissue and discard in the trash. Wash hands thoroughly with soap and water. °2. You may remove the patch earlier than 72 hours if you experience unpleasant side effects which may include dry mouth, dizziness or visual disturbances. °3. Avoid touching the patch. Wash your hands with soap and water after contact with the patch. °  ° °

## 2015-09-01 NOTE — Progress Notes (Signed)
Assisted Dr. Rose with right, ultrasound guided, pectoralis block. Side rails up, monitors on throughout procedure. See vital signs in flow sheet. Tolerated Procedure well. 

## 2015-09-01 NOTE — Transfer of Care (Signed)
Immediate Anesthesia Transfer of Care Note  Patient: Tracy Hart  Procedure(s) Performed: Procedure(s): RIGHT BREAST LUMPECTOMY WITH RADIOACTIVE SEED LOCALIZED AND SENTINEL LYMPH NODE BIOPSY (Right)  Patient Location: PACU  Anesthesia Type:GA combined with regional for post-op pain  Level of Consciousness: awake and patient cooperative  Airway & Oxygen Therapy: Patient Spontanous Breathing and Patient connected to face mask oxygen  Post-op Assessment: Report given to RN and Post -op Vital signs reviewed and stable  Post vital signs: Reviewed and stable  Last Vitals:  Filed Vitals:   09/01/15 1200 09/01/15 1202  BP: 133/79   Pulse:  98  Temp:    Resp:  14    Complications: No apparent anesthesia complications

## 2015-09-01 NOTE — Anesthesia Postprocedure Evaluation (Signed)
Anesthesia Post Note  Patient: Enrica Brannock  Procedure(s) Performed: Procedure(s) (LRB): RIGHT BREAST LUMPECTOMY WITH RADIOACTIVE SEED LOCALIZED AND SENTINEL LYMPH NODE BIOPSY (Right)  Patient location during evaluation: PACU Anesthesia Type: General and Regional Level of consciousness: awake and alert Pain management: pain level controlled Vital Signs Assessment: post-procedure vital signs reviewed and stable Respiratory status: spontaneous breathing and respiratory function stable Cardiovascular status: blood pressure returned to baseline and stable Postop Assessment: no signs of nausea or vomiting Anesthetic complications: no    Last Vitals:  Filed Vitals:   09/01/15 1215 09/01/15 1230  BP: 129/70 133/77  Pulse: 91 92  Temp:    Resp: 14 16    Last Pain:  Filed Vitals:   09/01/15 1236  PainSc: 5                  Jancie Kercher S

## 2015-09-02 ENCOUNTER — Other Ambulatory Visit: Payer: Self-pay | Admitting: *Deleted

## 2015-09-02 ENCOUNTER — Encounter (HOSPITAL_BASED_OUTPATIENT_CLINIC_OR_DEPARTMENT_OTHER): Payer: Self-pay | Admitting: Surgery

## 2015-09-02 DIAGNOSIS — C50919 Malignant neoplasm of unspecified site of unspecified female breast: Secondary | ICD-10-CM

## 2015-09-02 LAB — POCT HEMOGLOBIN-HEMACUE: HEMOGLOBIN: 15.2 g/dL — AB (ref 12.0–15.0)

## 2015-09-02 NOTE — Op Note (Signed)
[name], [name]               ACCOUNT NO.:  [id number]  MEDICAL RECORD NO.:  [id number]  LOCATION:  [facility]C                          FACILITY:  [facility]  PHYSICIAN:  [name], M.D. DATE OF BIRTH:  November 12, 1960  DATE OF PROCEDURE:  09/01/2015 DATE OF DISCHARGE:                              OPERATIVE REPORT   PREOPERATIVE DIAGNOSIS:  Right breast cancer.  POSTOPERATIVE DIAGNOSIS:  Right breast cancer.  PROCEDURES: 1. Right breast radioactive seed localized partial mastectomy. 2. Right axillary sentinel lymph node biopsy. 3. Injection of blue dye.  SURGEON:  [name], M.D.  ANESTHESIA:  General with 0.25% Marcaine and pectoral block applied by anesthesia.  ESTIMATED BLOOD LOSS:  Minimal.  INDICATIONS:  This is a 55 year old female, who was found to have an abnormality in the upper outer quadrant of the right breast on screening mammography.  Stereotactic biopsy performed showing invasive ductal carcinoma which is ER and PR positive.  The size of the lesions approximately 8 mm on ultrasound.  The decision was made after discussion to proceed with a radioactive seed localized right breast partial mastectomy and sentinel lymph node biopsy.  FINDINGS:  The partial mastectomy specimen was x-rayed and found to contain both the radioactive seed and marker clip.  Five separate sentinel lymph nodes were excised.  PROCEDURE IN DETAIL:  The patient was brought to the operating room, identified as Tracy Hart.  She had been identified in the holding area and radioactive isotope was injected underneath her nipple, and the radioactive seed was confirmed by the Neoprobe to be in the breast.  She was placed upon supine position on the operating room table and general anesthesia was induced.  I then prepped the right breast and injected blue dye underneath the nipple-areolar complex and massaged the breast. The patient's right breast and axilla were then prepped and draped  in usual sterile fashion.  I used the Neoprobe to identify an area increased uptake in the upper outer quadrant of the breast.  I anesthetized skin with Marcaine.  Made a longitudinal incision with a scalpel.  I then took this down to the breast tissue with electrocautery and performed a partial mastectomy removing the tissue in the upper outer quadrant going down to the chest wall.  This was done with the aid of the Neoprobe and was widely around the specimen.  Once the entire partial mastectomy specimen was removed, I again confirmed that the seed was in the specimen.  I marked all margins with marker paint, and x-ray confirmed that the radioactive seed and previously placed tissue marker were in the specimen.  This was then sent to Pathology for identification.  I then used the Neoprobe to evaluate the axilla through the same incision in the upper outer quadrant.  With the aid of Neoprobe and blue dye, I was able to identify 5 sentinel lymph nodes which were quite deep in the axilla.  The nodes do contain blue dye.  All the nodes were excised and sent to Pathology for evaluation.  I then examined the nodal basin and found no other increased uptake of radioactive isotope. Hemostasis then appeared to be achieved with the cautery.  I placed surgical  clips at the partial mastectomy site.  I then closed subcutaneous tissue with interrupted 3-0 Vicryl sutures and closed the skin with a running 4-0 Monocryl.  Skin glue was then applied.  The patient tolerated the procedure well.  All the counts were correct at the end of the procedure.  The patient was then extubated in the operating room and taken in a stable condition to the recovery room.     [name], M.D.     DB/MEDQ  D:  09/01/2015  T:  09/02/2015  Job:  [id number]

## 2015-09-03 ENCOUNTER — Ambulatory Visit (HOSPITAL_BASED_OUTPATIENT_CLINIC_OR_DEPARTMENT_OTHER): Payer: BLUE CROSS/BLUE SHIELD | Admitting: Oncology

## 2015-09-03 ENCOUNTER — Other Ambulatory Visit (HOSPITAL_BASED_OUTPATIENT_CLINIC_OR_DEPARTMENT_OTHER): Payer: BLUE CROSS/BLUE SHIELD

## 2015-09-03 VITALS — BP 129/80 | HR 88 | Temp 98.5°F | Resp 18 | Ht 68.0 in | Wt 215.0 lb

## 2015-09-03 DIAGNOSIS — C50411 Malignant neoplasm of upper-outer quadrant of right female breast: Secondary | ICD-10-CM | POA: Diagnosis not present

## 2015-09-03 DIAGNOSIS — Z170 Estrogen receptor positive status [ER+]: Secondary | ICD-10-CM | POA: Diagnosis not present

## 2015-09-03 DIAGNOSIS — C50919 Malignant neoplasm of unspecified site of unspecified female breast: Secondary | ICD-10-CM

## 2015-09-03 LAB — CBC WITH DIFFERENTIAL/PLATELET
BASO%: 0.9 % (ref 0.0–2.0)
BASOS ABS: 0.1 10*3/uL (ref 0.0–0.1)
EOS ABS: 0.3 10*3/uL (ref 0.0–0.5)
EOS%: 2.7 % (ref 0.0–7.0)
HEMATOCRIT: 38.1 % (ref 34.8–46.6)
HEMOGLOBIN: 13 g/dL (ref 11.6–15.9)
LYMPH#: 3.7 10*3/uL — AB (ref 0.9–3.3)
LYMPH%: 34.7 % (ref 14.0–49.7)
MCH: 34.2 pg — AB (ref 25.1–34.0)
MCHC: 34 g/dL (ref 31.5–36.0)
MCV: 100.5 fL (ref 79.5–101.0)
MONO#: 0.9 10*3/uL (ref 0.1–0.9)
MONO%: 8.1 % (ref 0.0–14.0)
NEUT#: 5.8 10*3/uL (ref 1.5–6.5)
NEUT%: 53.6 % (ref 38.4–76.8)
PLATELETS: 269 10*3/uL (ref 145–400)
RBC: 3.79 10*6/uL (ref 3.70–5.45)
RDW: 12.1 % (ref 11.2–14.5)
WBC: 10.8 10*3/uL — ABNORMAL HIGH (ref 3.9–10.3)

## 2015-09-03 LAB — COMPREHENSIVE METABOLIC PANEL
ALBUMIN: 3.9 g/dL (ref 3.5–5.0)
ALK PHOS: 83 U/L (ref 40–150)
ALT: 31 U/L (ref 0–55)
ANION GAP: 9 meq/L (ref 3–11)
AST: 30 U/L (ref 5–34)
BUN: 15.6 mg/dL (ref 7.0–26.0)
CALCIUM: 9.2 mg/dL (ref 8.4–10.4)
CHLORIDE: 100 meq/L (ref 98–109)
CO2: 27 mEq/L (ref 22–29)
Creatinine: 0.9 mg/dL (ref 0.6–1.1)
EGFR: 75 mL/min/{1.73_m2} — AB (ref >90)
Glucose: 102 mg/dl (ref 70–140)
POTASSIUM: 4.2 meq/L (ref 3.5–5.1)
Sodium: 135 mEq/L — ABNORMAL LOW (ref 136–145)
Total Bilirubin: 0.43 mg/dL (ref 0.20–1.20)
Total Protein: 7.6 g/dL (ref 6.4–8.3)

## 2015-09-03 NOTE — Progress Notes (Signed)
[facility]  [phone] [phone]     ID: Tracy Hart DOB: [birth year]  MR#: [id number]  GLO#:[id number]  Patient Care Team: Prince Solian, MD as PCP - General (Internal Medicine) [name] [name], MD as Consulting Physician (Obstetrics and Gynecology) [name], MD as Consulting Physician (Oncology) [name], MD as Consulting Physician (General Surgery) [name], MD as Consulting Physician (Radiation Oncology) PCP: [name], MD OTHER MD:  CHIEF COMPLAINT: Estrogen receptor positive breast cancer  CURRENT TREATMENT: Adjuvant treatment pending   BREAST CANCER HISTORY: "Tracy Hart" had routine screening mammography at Dr. [name] office late November, showing a possible mass in the right breast. On 08/04/2015 she underwent right diagnostic mammography with tomosynthesis and right breast ultrasonography at the breast Center. The breast density was category C. In the upper outer quadrant of the right breast there was a 0.8 cm area of asymmetry which was not palpable by exam. Ultrasound showed no suspicious mass or calcifications.  Biopsy of the area of asymmetry was performed 08/19/2015, and showed ([id number]) invasive and in situ ductal carcinoma, E-cadherin positive, estrogen receptor 90% positive, progesterone receptor 90% positive, both with strong staining intensity, with an MIB-1 of 5%, and no HER-2 amplification, the signals ratio being 1.44 and the number per cell 1.80.  The patient was then referred to surgery and after appropriate discussion underwent right lumpectomy and sentinel lymph node sampling 09/01/2015. The pathology from this procedure ([id number]) confirmed an invasive ductal carcinoma, grade 2, measuring 1.7 cm, with some low-grade ductal carcinoma in situ. Margins were negative. All 5 sentinel lymph nodes were clear.  The patient's subsequent history is as detailed below.  INTERVAL HISTORY: Tracy Hart was  evaluated in the breast clinic 09/03/2015 accompanied by her husband Octavia Bruckner and her sister Sharee Pimple  REVIEW OF SYSTEMS: There were no specific symptoms leading to the original mammogram, which was routinely scheduled. The patient denies unusual headaches, visual changes, nausea, vomiting, stiff neck, dizziness, or gait imbalance. There has been no cough, phlegm production, or pleurisy, no chest pain or pressure, and no change in bowel or bladder habits. The patient denies fever, rash, bleeding, unexplained fatigue or unexplained weight loss. Vickey tolerated her recent surgery well, with no unusual pain, fever, or bleeding. She admits to mild seasonal allergies. She exercises by walking usually 30 minutes 5 times a week. A detailed review of systems was otherwise entirely negative.  PAST MEDICAL HISTORY: Past Medical History  Diagnosis Date  . Hypertension   . Hypothyroidism   . Depression   . Anxiety   . Cancer [facility]) 2016    right breast    PAST SURGICAL HISTORY: Past Surgical History  Procedure Laterality Date  . Hernia repair Right 2009  . Breast lumpectomy with radioactive seed and sentinel lymph node biopsy Right 09/01/2015    Procedure: RIGHT BREAST LUMPECTOMY WITH RADIOACTIVE SEED LOCALIZED AND SENTINEL LYMPH NODE BIOPSY;  Surgeon: [name], MD;  Location: [facility];  Service: General;  Laterality: Right;    FAMILY HISTORY No family history on file. The patient's parents are in their early 104s as of January 2017. The patient had no brothers, 3 sisters. The patient's mother was diagnosed with breast cancer at age 11. The patient's sister was diagnosed with thyroid cancer at age 72. There is no history of ovarian cancer in the family.   GYNECOLOGIC HISTORY:  No LMP recorded. Patient is postmenopausal. Menarche age 56, the patient is GX P0. She was on oral contraceptives  between 1996 and 2016, and at the time of her diagnosis had been on hormone replacement for 2  months.   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 Her husband [name]") works in Occupational hygienist for a Technical sales engineer. At home is just the 2 of them, with no pets. The patient at tends [place]    ADVANCED DIRECTIVES: In place. The patient's husband is her healthcare power of attorney with her sister [name] listed as secondary   HEALTH MAINTENANCE: Social History  Substance Use Topics  . Smoking status: Never Smoker   . Smokeless tobacco: Not on file  . Alcohol Use: Yes     Comment: social     Colonoscopy: 2010/[facility]  PAP: November 2016  Bone density: Due/ at Dr [name]  Lipid panel:  Allergies  Allergen Reactions  . Codeine     No percocet, makes patient weird  . Sulfa Antibiotics     Current Outpatient Prescriptions  Medication Sig Dispense Refill  . ALPRAZolam (XANAX) 0.5 MG tablet Take 0.5 mg by mouth at bedtime as needed for anxiety.    Marland Kitchen aspirin 81 MG tablet Take 81 mg by mouth daily.    Marland Kitchen atorvastatin (LIPITOR) 40 MG tablet Take 40 mg by mouth daily.    . calcium carbonate (OS-CAL) 600 MG TABS tablet Take 600 mg by mouth 2 (two) times daily with a meal.    . cetirizine (ZYRTEC) 10 MG tablet Take 10 mg by mouth daily.    . cholecalciferol (VITAMIN D) 400 UNITS TABS tablet Take 800 Units by mouth 2 (two) times daily.    Marland Kitchen escitalopram (LEXAPRO) 10 MG tablet Take 10 mg by mouth daily.    Marland Kitchen estradiol (CLIMARA - DOSED IN MG/24 HR) 0.05 mg/24hr patch Place 0.05 mg onto the skin once a week.    . fluticasone (FLONASE) 50 MCG/ACT nasal spray Place 1 spray into both nostrils daily.    Marland Kitchen levothyroxine (SYNTHROID, LEVOTHROID) 137 MCG tablet Take 137 mcg by mouth daily before breakfast.    . norgestrel-ethinyl estradiol (LO/OVRAL,CRYSELLE) 0.3-30 MG-MCG tablet Take 1 tablet by mouth daily.    Marland Kitchen olmesartan-hydrochlorothiazide (BENICAR HCT) 40-25 MG per tablet Take 1 tablet by mouth daily.    . Omega-3  Fatty Acids (FISH OIL) 1200 MG CAPS Take by mouth 2 (two) times daily.    . progesterone (PROMETRIUM) 100 MG capsule Take 100 mg by mouth daily.    . traMADol (ULTRAM) 50 MG tablet Take 1-2 tablets (50-100 mg total) by mouth every 6 (six) hours as needed. 30 tablet 0  . traZODone (DESYREL) 50 MG tablet Take 50 mg by mouth at bedtime.     No current facility-administered medications for this visit.    OBJECTIVE: Middle-aged white woman who appears younger than stated age  58 Vitals:   09/03/15 1615  BP: 129/80  Pulse: 88  Temp: 98.5 F (36.9 C)  Resp: 18     Body mass index is 32.7 kg/(m^2).    ECOG FS:0 - Asymptomatic  Ocular: Sclerae unicteric, pupils equal, round and reactive to light Ear-nose-throat: Oropharynx clear and moist Lymphatic: No cervical or supraclavicular adenopathy Lungs no rales or rhonchi, good excursion bilaterally Heart regular rate and rhythm, no murmur appreciated Abd soft, nontender, positive bowel sounds MSK no focal spinal tenderness, no joint edema Neuro: non-focal, well-oriented, appropriate affect Breasts: The right breast is status post recent lumpectomy. The cosmetic result is excellent.  The incision is healing nicely, with no dehiscence, erythema, or local swelling. The right breast is minimally edematous. The right axilla is benign. The left breast is unremarkable   LAB RESULTS:  CMP     Component Value Date/Time   NA 135* 09/03/2015 1551   NA 138 08/27/2015 1225   K 4.2 09/03/2015 1551   K 4.5 08/27/2015 1225   CL 102 08/27/2015 1225   CO2 27 09/03/2015 1551   CO2 28 08/27/2015 1225   GLUCOSE 102 09/03/2015 1551   GLUCOSE 163* 08/27/2015 1225   BUN 15.6 09/03/2015 1551   BUN 12 08/27/2015 1225   CREATININE 0.9 09/03/2015 1551   CREATININE 0.61 08/27/2015 1225   CALCIUM 9.2 09/03/2015 1551   CALCIUM 9.5 08/27/2015 1225   PROT 7.6 09/03/2015 1551   ALBUMIN 3.9 09/03/2015 1551   AST 30 09/03/2015 1551   ALT 31 09/03/2015 1551    ALKPHOS 83 09/03/2015 1551   BILITOT 0.43 09/03/2015 1551   GFRNONAA >60 08/27/2015 1225   GFRAA >60 08/27/2015 1225    INo results found for: SPEP, UPEP  Lab Results  Component Value Date   WBC 10.8* 09/03/2015   NEUTROABS 5.8 09/03/2015   HGB 13.0 09/03/2015   HCT 38.1 09/03/2015   MCV 100.5 09/03/2015   PLT 269 09/03/2015      Chemistry      Component Value Date/Time   NA 135* 09/03/2015 1551   NA 138 08/27/2015 1225   K 4.2 09/03/2015 1551   K 4.5 08/27/2015 1225   CL 102 08/27/2015 1225   CO2 27 09/03/2015 1551   CO2 28 08/27/2015 1225   BUN 15.6 09/03/2015 1551   BUN 12 08/27/2015 1225   CREATININE 0.9 09/03/2015 1551   CREATININE 0.61 08/27/2015 1225      Component Value Date/Time   CALCIUM 9.2 09/03/2015 1551   CALCIUM 9.5 08/27/2015 1225   ALKPHOS 83 09/03/2015 1551   AST 30 09/03/2015 1551   ALT 31 09/03/2015 1551   BILITOT 0.43 09/03/2015 1551       No results found for: LABCA2  No components found for: LABCA125  No results for input(s): INR in the last 168 hours.  Urinalysis No results found for: COLORURINE, APPEARANCEUR, LABSPEC, PHURINE, GLUCOSEU, HGBUR, BILIRUBINUR, KETONESUR, PROTEINUR, UROBILINOGEN, NITRITE, LEUKOCYTESUR  STUDIES: Nm Sentinel Node Inj-no Rpt (breast)  09/01/2015  CLINICAL DATA: right breast cancer Sulfur colloid was injected intradermally by the nuclear medicine technologist for breast cancer sentinel node localization.   Mm Breast Surgical Specimen  09/01/2015  CLINICAL DATA:  Biopsy proven invasive and in situ mammary carcinoma in the upper-outer quadrant of the right breast. EXAM: SPECIMEN RADIOGRAPH OF THE RIGHT BREAST COMPARISON:  Previous exam(s). FINDINGS: Status post excision of the right breast. The radioactive seed and biopsy marker clip are present, completely intact, and were marked for pathology. IMPRESSION: Specimen radiograph of the right breast. Electronically Signed   By: Lillia Mountain M.D.   On: 09/01/2015 11:36    Mm Diag Breast Tomo Uni Right  08/19/2015  CLINICAL DATA:  Status post stereotactic-guided biopsy for right breast asymmetry/distortion performed earlier today. EXAM: DIAGNOSTIC RIGHT MAMMOGRAM POST STEREOTACTIC BIOPSY COMPARISON:  Previous exam(s). FINDINGS: Mammographic images were obtained following stereotactic guided biopsy of the right breast asymmetry/ distortion within the upper outer quadrant, at posterior depth, using a lateral approach. At the conclusion of the procedure, an X shaped tissue marker was placed at the biopsy site. The biopsy clip is well positioned at  the site of asymmetry/distortion. IMPRESSION: Postprocedure mammogram for clip placement. Biopsy clip is well positioned at the targeted site of asymmetry/distortion within the upper-outer quadrant at posterior depth. Final Assessment: Post Procedure Mammograms for Marker Placement Electronically Signed   By: Franki Cabot M.D.   On: 08/19/2015 09:08   Mm Rt Radioactive Seed Loc Mammo Guide  08/27/2015  CLINICAL DATA:  Biopsy proven invasive and in situ carcinoma of the right breast. EXAM: MAMMOGRAPHIC GUIDED RADIOACTIVE SEED LOCALIZATION OF THE RIGHT BREAST COMPARISON:  Previous exam(s). FINDINGS: Patient presents for radioactive seed localization prior to surgery. I met with the patient and we discussed the procedure of seed localization including benefits and alternatives. We discussed the high likelihood of a successful procedure. We discussed the risks of the procedure including infection, bleeding, tissue injury and further surgery. We discussed the low dose of radioactivity involved in the procedure. Informed, written consent was given. The usual time-out protocol was performed immediately prior to the procedure. Using mammographic guidance, sterile technique, 2% lidocaine and an I-125 radioactive seed, the X shaped clip in the upper-outer quadrant of the right breast was localized using a superior to inferior approach. The  follow-up mammogram images confirm the seed in the expected location and were marked for Dr. Ninfa Linden. Follow-up survey of the patient confirms presence of the radioactive seed. Order number of I-125 seed:  [id number]. Total activity:  4.098 millicuries  Reference Date: 08/07/2015 The patient tolerated the procedure well and was released from the [facility]. She was given instructions regarding seed removal. IMPRESSION: Radioactive seed localization right breast. No apparent complications. Electronically Signed   By: Lillia Mountain M.D.   On: 08/27/2015 14:13   Mm Rt Breast Bx W Loc Dev 1st Lesion Image Bx Spec Stereo Guide  08/20/2015  ADDENDUM REPORT: 08/20/2015 12:17 ADDENDUM: Pathology reveals INVASIVE AND IN SITU MAMMARY CARCINOMA of the upper outer quadrant of the Right breast. This was found to be concordant by Dr. Franki Cabot. Pathology results were discussed with the patient via telephone. She reported tenderness at the biopsy site and is doing well otherwise. Post biopsy care and instructions were reviewed and questions were answered. She was encouraged to contact [facility] with any additional questions and or concerns. A surgical referral was arranged with Dr. Nedra Hai of [facility] on August 21, 2015. Pathology results reported by Terie Purser RN on August 20, 2015. Electronically Signed   By: Franki Cabot M.D.   On: 08/20/2015 12:17  08/20/2015  CLINICAL DATA:  Patient with right breast asymmetry/distortion presents today for stereotactic-guided biopsy with tomosynthesis guidance. EXAM: RIGHT BREAST STEREOTACTIC CORE NEEDLE BIOPSY COMPARISON:  Previous exams. FINDINGS: The patient and I discussed the procedure of stereotactic-guided biopsy including benefits and alternatives. We discussed the high likelihood of a successful procedure. We discussed the risks of the procedure including infection, bleeding, tissue injury, clip migration, and  inadequate sampling. Informed written consent was given. The usual time out protocol was performed immediately prior to the procedure. Using sterile technique and 2% Lidocaine as local anesthetic, under stereotactic guidance, a 9 gauge vacuum assisted device was used to perform core needle biopsy of the area of asymmetry/distortion upper-outer quadrant of the right breast, at posterior depth, using a lateral approach. At the conclusion of the procedure, a X shaped tissue marker clip was deployed into the biopsy cavity. Follow-up 2-view mammogram was performed and dictated separately. IMPRESSION: Stereotactic-guided biopsy of the right breast asymmetry/distortion in the upper-outer  quadrant, at posterior depth, using a lateral approach. No apparent complications. Electronically Signed: By: Franki Cabot M.D. On: 08/19/2015 09:07    ASSESSMENT: 55 y.o. status post right breast biopsy 08/19/2015 for a clinical T1b N0, stage IA invasive ductal carcinoma, estrogen and progesterone receptor positive, HER-2 not amplified, with an MIB-1 of 5%  (1) status post right lumpectomy and sentinel lymph node sampling 09/01/2015 for a pT1c pN0, stage IA invasive ductal carcinoma, grade 2, with negative margins  (2) Oncotype DX has been requested  (3) adjuvant radiation to follow  (4) anti-estrogens to follow radiation  PLAN: We spent the better part of today's hour-long appointment discussing the biology of breast cancer in general, and the specifics of the patient's tumor in particular. Baking understands the difference between local and systemic therapy and in terms of local therapy she has a ready had her surgery. She will receive radiation once the chemotherapy decision is finalized.  We discussed systemic treatment options and she understands she will be a good candidate for anti-estrogens. Because she is concerned about menopausal symptoms, it may be that tamoxifen will be a better choice for her since that  would allow her for example to use vaginal estrogens, which is contraindicated with aromatase inhibitors. She understands she will not be a candidate for anti-HER-2 immunotherapy since her cancer does not overexpress HER-2  The chemotherapy decision is more complicated. This is a grade 2 tumor but it has a very low proliferation fraction. It is estrogen and progesterone receptor positive. The benefit from chemotherapy is likely to be marginal. Accordingly we are requesting an Oncotype to help clarify and quantitate her risk of distant recurrence. We should have those results within 2 weeks. My expectation is that the cancer will fall in the "low-risk" group and she can safely avoid chemotherapy but we do have to wait for those results before she can start her radiation treatments.  I have asked her to stop her estrogen and progesterone replacement. She will simply stop the progesterone portion. She will leave the estrogen patch in place but not replace it. Hopefully this will make the transition a little less abrupt.  We discussed hot flashes, vaginal dryness, thin bones, weight gain, insomnia, and mood changes is likely accompaniment of menopause. We can help her with all of these but I am not starting her on any medications right now until she develops specific symptoms. I have asked her to call us though as soon as these problems started to manifest themselves  Tracy Hart has a good understanding of the overall plan. She agrees with it. She knows the goal of treatment in her case is cure. She will call with any problems that may develop before her next visit here.  [name], MD   09/03/2015 6:29 PM Medical Oncology and Hematology [facility] [address] [place], [place] [address] Tel. [phone]    Fax. [phone]

## 2015-09-04 ENCOUNTER — Encounter: Payer: Self-pay | Admitting: *Deleted

## 2015-09-04 NOTE — Progress Notes (Signed)
Ordered oncotype per Dr. Magrinat. Faxed PA to [employer] and faxed requisition to pathology and confirmed receipt.  

## 2015-09-07 ENCOUNTER — Telehealth: Payer: Self-pay | Admitting: Oncology

## 2015-09-07 NOTE — Telephone Encounter (Signed)
Called and left a message with march follow up

## 2015-09-10 ENCOUNTER — Ambulatory Visit: Payer: BLUE CROSS/BLUE SHIELD | Admitting: Radiation Oncology

## 2015-09-16 ENCOUNTER — Encounter (HOSPITAL_COMMUNITY): Payer: Self-pay

## 2015-09-16 ENCOUNTER — Telehealth: Payer: Self-pay | Admitting: *Deleted

## 2015-09-16 ENCOUNTER — Other Ambulatory Visit: Payer: Self-pay | Admitting: Oncology

## 2015-09-16 NOTE — Telephone Encounter (Signed)
Received oncotype score of 23/15%. Scheduled and confirmed appt with Dr. Jana Hakim on 09/19/15 at 3:30PM for discussion.

## 2015-09-19 ENCOUNTER — Ambulatory Visit: Payer: BLUE CROSS/BLUE SHIELD | Admitting: Oncology

## 2015-09-19 ENCOUNTER — Telehealth: Payer: Self-pay | Admitting: Oncology

## 2015-09-19 ENCOUNTER — Ambulatory Visit (HOSPITAL_BASED_OUTPATIENT_CLINIC_OR_DEPARTMENT_OTHER): Payer: BLUE CROSS/BLUE SHIELD | Admitting: Oncology

## 2015-09-19 VITALS — BP 139/75 | HR 86 | Temp 98.0°F | Resp 18 | Ht 68.0 in | Wt 217.7 lb

## 2015-09-19 DIAGNOSIS — C50411 Malignant neoplasm of upper-outer quadrant of right female breast: Secondary | ICD-10-CM

## 2015-09-19 NOTE — Progress Notes (Signed)
[place]  [phone] [phone]     ID: Tracy Hart DOB: 31-Aug-1960  MR#: [id number]  MVE#:[id number]  Patient Care Team: Tracy Solian, MD as PCP - General (Internal Medicine) Tracy Hart Tracy Cristal, MD as Consulting Physician (Obstetrics and Gynecology) Tracy Cruel, MD as Consulting Physician (Oncology) Tracy Keens, MD as Consulting Physician (General Surgery) Tracy Rudd, MD as Consulting Physician (Radiation Oncology) PCP: Tracy Ringer, MD OTHER MD:  CHIEF COMPLAINT: Estrogen receptor positive breast cancer  CURRENT TREATMENT: Adjuvant treatment pending   BREAST CANCER HISTORY: From the original intake note:  "Tracy Hart" had routine screening mammography at Dr. Verlon Hart office late November, showing a possible mass in the right breast. On 08/04/2015 she underwent right diagnostic mammography with tomosynthesis and right breast ultrasonography at the breast Center. The breast density was category C. In the upper outer quadrant of the right breast there was a 0.8 cm area of asymmetry which was not palpable by exam. Ultrasound showed no suspicious mass or calcifications.  Biopsy of the area of asymmetry was performed 08/19/2015, and showed ([id number]) invasive and in situ ductal carcinoma, E-cadherin positive, estrogen receptor 90% positive, progesterone receptor 90% positive, both with strong staining intensity, with an MIB-1 of 5%, and no HER-2 amplification, the signals ratio being 1.44 and the number per cell 1.80.  The patient was then referred to surgery and after appropriate discussion underwent right lumpectomy and sentinel lymph node sampling 09/01/2015. The pathology from this procedure ([id number]) confirmed an invasive ductal carcinoma, grade 2, measuring 1.7 cm, with some low-grade ductal carcinoma in situ. Margins were negative. All 5 sentinel lymph nodes were clear.  The patient's subsequent history is as detailed  below.  INTERVAL HISTORY: Tracy Hart returns today for follow-up of her estrogen receptor positive breast cancer accompanied by her husband Tracy Hart. Since her last visit here we got the results of her Oncotype, which shows a score of 23, predicting a risk of outside the breast recurrence of 15% within 10 years if Tracy Hart's only systemic therapy is tamoxifen for 5 years she is here today to discuss her options  REVIEW OF SYSTEMS: She went off progesterone 2 weeks ago and her estrogen patch fell off about a week ago. She is not having any hot flashes or other menopausal symptoms at this point. She has had some swelling of the right breast, but no erythema. She has minimal postoperative tenderness. Otherwise a detailed review of systems today was noncontributory  PAST MEDICAL HISTORY: Past Medical History  Diagnosis Date  . Hypertension   . Hypothyroidism   . Depression   . Anxiety   . Cancer [facility]) 2016    right breast    PAST SURGICAL HISTORY: Past Surgical History  Procedure Laterality Date  . Hernia repair Right 2009  . Breast lumpectomy with radioactive seed and sentinel lymph node biopsy Right 09/01/2015    Procedure: RIGHT BREAST LUMPECTOMY WITH RADIOACTIVE SEED LOCALIZED AND SENTINEL LYMPH NODE BIOPSY;  Surgeon: Tracy Keens, MD;  Location: [place];  Service: General;  Laterality: Right;    FAMILY HISTORY No family history on file. The patient's parents are in their early 25s as of January 2017. The patient had no brothers, 3 sisters. The patient's mother was diagnosed with breast cancer at age 64. The patient's sister was diagnosed with thyroid cancer at age 26. There is no history of ovarian cancer in the family.   GYNECOLOGIC HISTORY:  No LMP recorded. Patient is postmenopausal. Menarche age 57,  the patient is GX P0. She was on oral contraceptives between 1996 and 2016, and at the time of her diagnosis had been on hormone replacement for 2 months.   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 Her husband Tracy Hart") works in Occupational hygienist for a Technical sales engineer. At home is just the 2 of them, with no pets. The patient at tends [place]    ADVANCED DIRECTIVES: In place. The patient's husband is her healthcare power of attorney with her sister Tracy Hart listed as secondary   HEALTH MAINTENANCE: Social History  Substance Use Topics  . Smoking status: Never Smoker   . Smokeless tobacco: Not on file  . Alcohol Use: Yes     Comment: social     Colonoscopy: 2010/[facility]  PAP: November 2016  Bone density: Due/ at Dr Tracy Hart  Lipid panel:  Allergies  Allergen Reactions  . Codeine     No percocet, makes patient weird  . Sulfa Antibiotics     Current Outpatient Prescriptions  Medication Sig Dispense Refill  . ALPRAZolam (XANAX) 0.5 MG tablet Take 0.5 mg by mouth at bedtime as needed for anxiety.    Marland Kitchen aspirin 81 MG tablet Take 81 mg by mouth daily.    Marland Kitchen atorvastatin (LIPITOR) 40 MG tablet Take 40 mg by mouth daily.    . calcium carbonate (OS-CAL) 600 MG TABS tablet Take 600 mg by mouth 2 (two) times daily with a meal.    . cetirizine (ZYRTEC) 10 MG tablet Take 10 mg by mouth daily.    . cholecalciferol (VITAMIN D) 400 UNITS TABS tablet Take 800 Units by mouth 2 (two) times daily.    Marland Kitchen escitalopram (LEXAPRO) 10 MG tablet Take 10 mg by mouth daily.    . fluticasone (FLONASE) 50 MCG/ACT nasal spray Place 1 spray into both nostrils daily.    Marland Kitchen levothyroxine (SYNTHROID, LEVOTHROID) 137 MCG tablet Take 137 mcg by mouth daily before breakfast.    . olmesartan-hydrochlorothiazide (BENICAR HCT) 40-25 MG per tablet Take 1 tablet by mouth daily.    . Omega-3 Fatty Acids (FISH OIL) 1200 MG CAPS Take by mouth 2 (two) times daily.    . progesterone (PROMETRIUM) 100 MG capsule Take 100 mg by mouth daily.    . traMADol (ULTRAM) 50 MG tablet Take 1-2 tablets (50-100 mg total) by mouth every 6 (six)  hours as needed. 30 tablet 0  . traZODone (DESYREL) 50 MG tablet Take 50 mg by mouth at bedtime.     No current facility-administered medications for this visit.    OBJECTIVE: Middle-aged white woman in no acute distress Filed Vitals:   09/19/15 1509  BP: 139/75  Pulse: 86  Temp: 98 F (36.7 C)  Resp: 18     Body mass index is 33.11 kg/(m^2).    ECOG FS:1 - Symptomatic but completely ambulatory  Physical exam was not repeated today  LAB RESULTS:  CMP     Component Value Date/Time   NA 135* 09/03/2015 1551   NA 138 08/27/2015 1225   K 4.2 09/03/2015 1551   K 4.5 08/27/2015 1225   CL 102 08/27/2015 1225   CO2 27 09/03/2015 1551   CO2 28 08/27/2015 1225   GLUCOSE 102 09/03/2015 1551   GLUCOSE 163* 08/27/2015 1225   BUN 15.6 09/03/2015 1551   BUN 12 08/27/2015 1225   CREATININE 0.9 09/03/2015 1551   CREATININE 0.61 08/27/2015 1225   CALCIUM  9.2 09/03/2015 1551   CALCIUM 9.5 08/27/2015 1225   PROT 7.6 09/03/2015 1551   ALBUMIN 3.9 09/03/2015 1551   AST 30 09/03/2015 1551   ALT 31 09/03/2015 1551   ALKPHOS 83 09/03/2015 1551   BILITOT 0.43 09/03/2015 1551   GFRNONAA >60 08/27/2015 1225   GFRAA >60 08/27/2015 1225    INo results found for: SPEP, UPEP  Lab Results  Component Value Date   WBC 10.8* 09/03/2015   NEUTROABS 5.8 09/03/2015   HGB 13.0 09/03/2015   HCT 38.1 09/03/2015   MCV 100.5 09/03/2015   PLT 269 09/03/2015      Chemistry      Component Value Date/Time   NA 135* 09/03/2015 1551   NA 138 08/27/2015 1225   K 4.2 09/03/2015 1551   K 4.5 08/27/2015 1225   CL 102 08/27/2015 1225   CO2 27 09/03/2015 1551   CO2 28 08/27/2015 1225   BUN 15.6 09/03/2015 1551   BUN 12 08/27/2015 1225   CREATININE 0.9 09/03/2015 1551   CREATININE 0.61 08/27/2015 1225      Component Value Date/Time   CALCIUM 9.2 09/03/2015 1551   CALCIUM 9.5 08/27/2015 1225   ALKPHOS 83 09/03/2015 1551   AST 30 09/03/2015 1551   ALT 31 09/03/2015 1551   BILITOT 0.43  09/03/2015 1551       No results found for: LABCA2  No components found for: LABCA125  No results for input(s): INR in the last 168 hours.  Urinalysis No results found for: COLORURINE, APPEARANCEUR, LABSPEC, PHURINE, GLUCOSEU, HGBUR, BILIRUBINUR, KETONESUR, PROTEINUR, UROBILINOGEN, NITRITE, LEUKOCYTESUR  STUDIES: Nm Sentinel Node Inj-no Rpt (breast)  09/01/2015  CLINICAL DATA: right breast cancer Sulfur colloid was injected intradermally by the nuclear medicine technologist for breast cancer sentinel node localization.   Mm Breast Surgical Specimen  09/01/2015  CLINICAL DATA:  Biopsy proven invasive and in situ mammary carcinoma in the upper-outer quadrant of the right breast. EXAM: SPECIMEN RADIOGRAPH OF THE RIGHT BREAST COMPARISON:  Previous exam(s). FINDINGS: Status post excision of the right breast. The radioactive seed and biopsy marker clip are present, completely intact, and were marked for pathology. IMPRESSION: Specimen radiograph of the right breast. Electronically Signed   By: Lillia Mountain M.D.   On: 09/01/2015 11:36   Mm Rt Radioactive Seed Loc Mammo Guide  08/27/2015  CLINICAL DATA:  Biopsy proven invasive and in situ carcinoma of the right breast. EXAM: MAMMOGRAPHIC GUIDED RADIOACTIVE SEED LOCALIZATION OF THE RIGHT BREAST COMPARISON:  Previous exam(s). FINDINGS: Patient presents for radioactive seed localization prior to surgery. I met with the patient and we discussed the procedure of seed localization including benefits and alternatives. We discussed the high likelihood of a successful procedure. We discussed the risks of the procedure including infection, bleeding, tissue injury and further surgery. We discussed the low dose of radioactivity involved in the procedure. Informed, written consent was given. The usual time-out protocol was performed immediately prior to the procedure. Using mammographic guidance, sterile technique, 2% lidocaine and an I-125 radioactive seed, the X  shaped clip in the upper-outer quadrant of the right breast was localized using a superior to inferior approach. The follow-up mammogram images confirm the seed in the expected location and were marked for Dr. Ninfa Linden. Follow-up survey of the patient confirms presence of the radioactive seed. Order number of I-125 seed:  [id number]. Total activity:  0.865 millicuries  Reference Date: 08/07/2015 The patient tolerated the procedure well and was released from the [facility]. She was  given instructions regarding seed removal. IMPRESSION: Radioactive seed localization right breast. No apparent complications. Electronically Signed   By: Lillia Mountain M.D.   On: 08/27/2015 14:13    ASSESSMENT: 55 y.o. status post right breast biopsy 08/19/2015 for a clinical T1b N0, stage IA invasive ductal carcinoma, estrogen and progesterone receptor positive, HER-2 not amplified, with an MIB-1 of 5%  (1) status post right lumpectomy and sentinel lymph node sampling 09/01/2015 for a pT1c pN0, stage IA invasive ductal carcinoma, grade 2, with negative margins  (2) Oncotype DX score of 23 falls in the intermediate range, and predicts a risk of recurrence outside the breast within 10 years of 15%, if the patient's only systemic therapy is tamoxifen for 5 years. It also predicts an absolute risk reduction of 4% with added chemotherapy  (3) adjuvant radiation to follow  (4) anti-estrogens to follow radiation  PLAN: We met for a liver over 40 minutes today just to make sure Tracy Hart and Tracy Hart understood the details of her Oncotype. We went over the fact that tamoxifen for 5 years once the standard of care at the time the original database was gathered, but that now we have better options which will further reduce the risk of recurrence by approximately 3%. More concretely, if for example she took tamoxifen for 2 years followed by an anastrozole 43, she would have probably a risk of recurrence in the 12% range. This means she would  have an 88% chance of not having cancer recurrence outside the breast within 10 years without chemotherapy.  Chemotherapy generally reduces risk by one third and this is in line with the database from Oncotype which also predicts an absolute benefit of 4% from chemotherapy. There are 2 ways to look at this data. The "yes chemotherapy" way says "I don't want to be one of the 21 women in my group who are going to get stage IV incurable breast cancer, and I am willing to take chemotherapy to reduce my risk of that happening by 4%.  The "no chemotherapy" way says "I have a 96% chance of getting no benefit at all from chemotherapy but 100% chance of getting side effects. More specifically of 100 women like me 2 were already "cured" so they would get no benefit from chemotherapy and an additional 8 would get the cancer back despite chemotherapy. With office like that I am not interested in chemotherapy".  Lexine Baton initially leaned towards "no chemotherapy", but Tracy Hart strongly leans in favor of it. They're going to continue to discuss this or the next couple of weeks but the feeling I got at the end is that she will probably with some reluctance agreed to proceed to chemotherapy  Specifics of the chemotherapy would be cyclophosphamide and docetaxel 4, given 3 weeks apart. She will need a port placed and she will be seeing Dr. Ninfa Linden on February 3 when she can discuss that further. She will be meeting with radiation oncology February 6 and I have asked her to stop by my office that day just let me know what she is thinking. I have gone ahead and made her an appointment with my 61 assistant for February 10 to discuss anti-emetics and other supportive drugs, and I have gone ahead and tentatively scheduled her to receive chemotherapy starting February 14, so we can get everything ready if she indeed decides to receive it.  She will also come to "chemotherapy school" on February 6. She knows to call for any  problems that may develop  before the next visit here.    Tracy Cruel, MD   09/19/2015 3:55 PM Medical Oncology and Hematology [facility] [address] [place], [place] [address] Tel. [phone]    Fax. [phone]

## 2015-09-19 NOTE — Telephone Encounter (Signed)
Scheduled appt per pot, avs report printed. Staff message sent.

## 2015-09-24 ENCOUNTER — Telehealth: Payer: Self-pay

## 2015-09-24 ENCOUNTER — Other Ambulatory Visit: Payer: Self-pay | Admitting: Oncology

## 2015-09-24 ENCOUNTER — Other Ambulatory Visit: Payer: Self-pay | Admitting: Surgery

## 2015-09-24 NOTE — Telephone Encounter (Signed)
Pt called stating she has decided to go forward with chemotherapy. She will attend her chemo class on Monday. She has questions for [facility] "about how the whole thing works".

## 2015-09-24 NOTE — Telephone Encounter (Signed)
Spoke with patient who states that she spoke with Dawn, Therapist, sports and got all her questions answered.  Patient was encouraged to call with any further questions.  Writer will route this messgae to Dr. Jana Hakim so he is aware that patient has decided to go ahead with treatment.

## 2015-09-25 ENCOUNTER — Telehealth: Payer: Self-pay | Admitting: Oncology

## 2015-09-25 ENCOUNTER — Other Ambulatory Visit: Payer: Self-pay | Admitting: Oncology

## 2015-09-25 ENCOUNTER — Encounter: Payer: Self-pay | Admitting: Radiation Oncology

## 2015-09-25 NOTE — Telephone Encounter (Signed)
Spoke with patient and she is aware that her appts have changed and was advised to see [place] on 2/6 and get a new schedule

## 2015-09-25 NOTE — Progress Notes (Signed)
Location of Breast Cancer: Right Breast Upper Outer Quadrant   Histology per Pathology Report: Diagnosis 08/19/15: initial dx: Breast, right, needle core biopsy, upper outer quadrant - INVASIVE AND IN SITU MAMMARY CARCINOMA.  Receptor Status: ER(90%+), PR (90%+), Her2-neu (neg  KI-67 5%)  Did patient present with symptoms (if so, please note symptoms) or was this found on screening mammography?: screening mammogram  Past/Anticipated interventions by surgeon, if TDD:[id number] 09/01/2015: Dr. Harvie Bridge 1. Breast, lumpectomy, Right - INVASIVE DUCTAL CARCINOMA, GRADE 2/3, SPANNING 1.7 CM.- DUCTAL CARCINOMA IN SITU, LOW GRADE. - THE SURGICAL RESECTION MARGINS ARE NEGATIVE FOR CARCINOMA.- SEE ONCOLOGY TABLE BELOW 2. Lymph node, sentinel, biopsy, Right- THERE IS NO EVIDENCE OF CARCINOMA IN 1 OF 1 LYMPH NODE (0/1). 3. Lymph node, sentinel, biopsy, Right- THERE IS NO EVIDENCE OF CARCINOMA IN 1 OF 1 LYMPH NODE (0/1). 4. Lymph node, sentinel, biopsy, Right - THERE IS NO EVIDENCE OF CARCINOMA IN 1 OF 1 LYMPH NODE (0/1). 5. Lymph node, sentinel, biopsy, Right - THERE IS NO EVIDENCE OF CARCINOMA IN 1 OF 1 LYMPH NODE (0/1). 6. Lymph node, sentinel, biopsy, Right - THERE IS NO EVIDENCE OF CARCINOMA IN 1 OF 1 LYMPH   Port a cath placement 09/29/15 Dr. Ninfa Linden, MD Past/Anticipated interventions by medical oncology, if any: Chemotherapy :[facility] 09/29/15., Dr. Jana Hakim,  appt Med/Onc  ,10/03/15; 1st  t cycophosphamide / docetaxe l infusion 10/07/15 Oncotype DX  Results=23, anti estrogens to follow radiation  Lymphedema issues, if any: NO  Pain issues, if any:    SAFETY ISSUES: NO  Prior radiation?  NO  Pacemaker/ICD? NO  Possible current pregnancy? NO  Is the patient on methotrexate? NO  Current Complaints / other details: Married, Menarche age 34,GOPO, hx oral contraceptives(1996-2016) time of dx on HRT 2 months, , Former cigarette smoker  Quit 1989, occasional alcohol no  illicit drug  use, hx Anxiety Mother -Breast cancer age 71, , DM,HTN, ,sister age 74  thyroid  cancer,   Allergies: Codeine sulfate, No Percocet-makes patient weird, Sulfamethoxazole    Macie Baum, Felicita Gage, RN 09/25/2015,8:37 AM

## 2015-09-25 NOTE — Progress Notes (Unsigned)
Tracy Hart has decided to receive chemotherapy. She will come to chemotherapy school, have a port placed, and see our nurse practitioner February 10 in preparation for her first cycle from every 14th. I have gone ahead and added the additional cycles as well as visits through March

## 2015-09-26 ENCOUNTER — Encounter (HOSPITAL_COMMUNITY): Payer: Self-pay

## 2015-09-26 ENCOUNTER — Encounter (HOSPITAL_COMMUNITY)
Admission: RE | Admit: 2015-09-26 | Discharge: 2015-09-26 | Disposition: A | Discharge status: Home / Self Care | Payer: BLUE CROSS/BLUE SHIELD | Source: Ambulatory Visit | Attending: Surgery | Admitting: Surgery

## 2015-09-26 DIAGNOSIS — F419 Anxiety disorder, unspecified: Secondary | ICD-10-CM | POA: Diagnosis not present

## 2015-09-26 DIAGNOSIS — I10 Essential (primary) hypertension: Secondary | ICD-10-CM | POA: Diagnosis not present

## 2015-09-26 DIAGNOSIS — K219 Gastro-esophageal reflux disease without esophagitis: Secondary | ICD-10-CM | POA: Diagnosis not present

## 2015-09-26 DIAGNOSIS — E039 Hypothyroidism, unspecified: Secondary | ICD-10-CM | POA: Diagnosis not present

## 2015-09-26 DIAGNOSIS — F329 Major depressive disorder, single episode, unspecified: Secondary | ICD-10-CM | POA: Diagnosis not present

## 2015-09-26 DIAGNOSIS — Z87891 Personal history of nicotine dependence: Secondary | ICD-10-CM | POA: Diagnosis not present

## 2015-09-26 DIAGNOSIS — C50912 Malignant neoplasm of unspecified site of left female breast: Secondary | ICD-10-CM | POA: Diagnosis not present

## 2015-09-26 HISTORY — DX: Hyperlipidemia, unspecified: E78.5

## 2015-09-26 HISTORY — DX: Personal history of other diseases of the digestive system: Z87.19

## 2015-09-26 HISTORY — DX: Insomnia, unspecified: G47.00

## 2015-09-26 HISTORY — DX: Gastro-esophageal reflux disease without esophagitis: K21.9

## 2015-09-26 HISTORY — DX: Other seasonal allergic rhinitis: J30.2

## 2015-09-26 HISTORY — DX: Personal history of other diseases of the respiratory system: Z87.09

## 2015-09-26 LAB — CBC
HCT: 38.5 % (ref 36.0–46.0)
HEMOGLOBIN: 13.5 g/dL (ref 12.0–15.0)
MCH: 34.8 pg — AB (ref 26.0–34.0)
MCHC: 35.1 g/dL (ref 30.0–36.0)
MCV: 99.2 fL (ref 78.0–100.0)
PLATELETS: 276 10*3/uL (ref 150–400)
RBC: 3.88 MIL/uL (ref 3.87–5.11)
RDW: 11.9 % (ref 11.5–15.5)
WBC: 8.1 10*3/uL (ref 4.0–10.5)

## 2015-09-26 LAB — BASIC METABOLIC PANEL
ANION GAP: 6 (ref 5–15)
BUN: 15 mg/dL (ref 6–20)
CALCIUM: 9.8 mg/dL (ref 8.9–10.3)
CO2: 28 mmol/L (ref 22–32)
CREATININE: 0.82 mg/dL (ref 0.44–1.00)
Chloride: 105 mmol/L (ref 101–111)
Glucose, Bld: 109 mg/dL — ABNORMAL HIGH (ref 65–99)
Potassium: 4.1 mmol/L (ref 3.5–5.1)
SODIUM: 139 mmol/L (ref 135–145)

## 2015-09-26 NOTE — Pre-Procedure Instructions (Signed)
Tracy Hart  09/26/2015      [id number] - [place], [place]. [place] [address] Phone: [phone] Fax: [phone]    Your procedure is scheduled on Mon, Feb 6 @ 2:05 PM  Report to [facility] at 12:00 PM  Call this number if you have problems the morning of surgery:  [phone]   Remember:  Do not eat food or drink liquids after midnight.  Take these medicines the morning of surgery with A SIP OF WATER Alprazolam(Xanax),Zyrtec(Cetirizine),Lexapro(Escitalopram),Flonase(Fluticasone),Synthroid(Levothyroxine),and Tramadol(Ultram-if needed)                Stop taking your Fish Oil and Aspirin.No Goody's,BC's,Aleve,Ibuprofen,Motrin,Advil,or any Herbal Medications.    Do not wear jewelry, make-up or nail polish.  Do not wear lotions, powders, or perfumes.  You may wear deodorant.  Do not shave 48 hours prior to surgery.    Do not bring valuables to the hospital.  [facility] is not responsible for any belongings or valuables.  Contacts, dentures or bridgework may not be worn into surgery.  Leave your suitcase in the car.  After surgery it may be brought to your room.  For patients admitted to the hospital, discharge time will be determined by your treatment team.  Patients discharged the day of surgery will not be allowed to drive home.    Special instructions:  [facility] - Preparing for Surgery  Before surgery, you can play an important role.  Because skin is not sterile, your skin needs to be as free of germs as possible.  You can reduce the number of germs on you skin by washing with CHG (chlorahexidine gluconate) soap before surgery.  CHG is an antiseptic cleaner which kills germs and bonds with the skin to continue killing germs even after washing.  Please DO NOT use if you have an allergy to CHG or antibacterial soaps.  If your skin becomes reddened/irritated stop using the CHG  and inform your nurse when you arrive at Short Stay.  Do not shave (including legs and underarms) for at least 48 hours prior to the first CHG shower.  You may shave your face.  Please follow these instructions carefully:   1.  Shower with CHG Soap the night before surgery and the                                morning of Surgery.  2.  If you choose to wash your hair, wash your hair first as usual with your       normal shampoo.  3.  After you shampoo, rinse your hair and body thoroughly to remove the                      Shampoo.  4.  Use CHG as you would any other liquid soap.  You can apply chg directly       to the skin and wash gently with scrungie or a clean washcloth.  5.  Apply the CHG Soap to your body ONLY FROM THE NECK DOWN.        Do not use on open wounds or open sores.  Avoid contact with your eyes,       ears, mouth and genitals (private parts).  Wash genitals (private parts)       with your normal soap.  6.  Wash thoroughly,  paying special attention to the area where your surgery        will be performed.  7.  Thoroughly rinse your body with warm water from the neck down.  8.  DO NOT shower/wash with your normal soap after using and rinsing off       the CHG Soap.  9.  Pat yourself dry with a clean towel.            10.  Wear clean pajamas.            11.  Place clean sheets on your bed the night of your first shower and do not        sleep with pets.  Day of Surgery  Do not apply any lotions/deoderants the morning of surgery.  Please wear clean clothes to the hospital/surgery center.    Please read over the following fact sheets that you were given. Pain Booklet, Coughing and Deep Breathing and Surgical Site Infection Prevention

## 2015-09-26 NOTE — Progress Notes (Addendum)
Cardiologist denies  Medical Md is Dr.Avva  Echo denies  Stress test denies  Heart cath denies   EKG in epic from 08-27-15  CXR denies having one in past yr

## 2015-09-28 MED ORDER — CEFAZOLIN SODIUM-DEXTROSE 2-3 GM-% IV SOLR
2.0000 g | INTRAVENOUS | Status: AC
  Administered 2015-09-29: 2 g via INTRAVENOUS
  Filled 2015-09-28: qty 50

## 2015-09-28 NOTE — H&P (Signed)
  Tracy Hart 09/26/2015 9:01 AM Location: [facility] Patient #: [id number] DOB: Apr 28, 1961 Married / Language: English / Race: White Female   History of Present Illness (Zekiel Torian A. Ninfa Linden MD; 09/26/2015 9:18 AM) The patient is a 55 year old female presenting for a post-operative visit. She is here for first postoperative visit status post radioactive seed localized right breast lumpectomy and sentinel lymph node biopsy. She is doing well. She already knows her pathology report and is seeing oncology postoperatively and plans are to proceed with Port-A-Cath on Monday for IV acces for chemotherapy. She has no complaint.   Medication History Illene Regulus, CMA; 09/26/2015 9:02 AM) Medications Reconciled  See list  NKDA  ROS: negative  PSHX:  See above   Vitals (Alisha Spillers CMA; 09/26/2015 9:02 AM) 09/26/2015 9:02 AM Weight: 210.8 lb Height: 68in Body Surface Area: 2.09 m Body Mass Index: 32.05 kg/m  Pulse: 80 (Regular)  BP: 118/82 (Sitting, Left Arm, Standard)  Physical Exam The physical exam findings are as follows: Generally well in appearance Lungs clear bilaterally CV RRR Abdomen soft, NT Skin without erythema Note:On exam, her incision is healing very well. There is no evidence of infection.    Assessment & Plan (Rilyn Scroggs A. Ninfa Linden MD; 09/26/2015 9:19 AM)  Right breast cancer  Impression: She is doing well postoperatively. Because of her Oncotype score, she has decided to proceed with IV chemotherapy. She is scheduled for this Monday for me to place a Port-A-Cath. I discussed the surgical procedure with her in detail. I discussed the risk which includes but is not limited to bleeding, infection, pneumothorax, etc. She understands and wishes to proceed.

## 2015-09-29 ENCOUNTER — Inpatient Hospital Stay: Admission: RE | Admit: 2015-09-29 | Payer: BLUE CROSS/BLUE SHIELD | Source: Ambulatory Visit

## 2015-09-29 ENCOUNTER — Ambulatory Visit: Payer: BLUE CROSS/BLUE SHIELD

## 2015-09-29 ENCOUNTER — Ambulatory Visit (HOSPITAL_COMMUNITY)
Admission: RE | Admit: 2015-09-29 | Discharge: 2015-09-29 | Disposition: A | Discharge status: Home / Self Care | Payer: BLUE CROSS/BLUE SHIELD | Source: Ambulatory Visit | Attending: Surgery | Admitting: Surgery

## 2015-09-29 ENCOUNTER — Ambulatory Visit (HOSPITAL_COMMUNITY): Payer: BLUE CROSS/BLUE SHIELD

## 2015-09-29 ENCOUNTER — Ambulatory Visit (HOSPITAL_COMMUNITY): Payer: BLUE CROSS/BLUE SHIELD | Admitting: Anesthesiology

## 2015-09-29 ENCOUNTER — Encounter (HOSPITAL_COMMUNITY): Payer: Self-pay | Admitting: General Practice

## 2015-09-29 ENCOUNTER — Ambulatory Visit
Admission: RE | Admit: 2015-09-29 | Discharge: 2015-09-29 | Disposition: A | Discharge status: Home / Self Care | Payer: BLUE CROSS/BLUE SHIELD | Source: Ambulatory Visit | Attending: Radiation Oncology | Admitting: Radiation Oncology

## 2015-09-29 ENCOUNTER — Encounter (HOSPITAL_COMMUNITY)
Admission: RE | Disposition: A | Discharge status: Home / Self Care | Payer: Self-pay | Source: Ambulatory Visit | Attending: Surgery

## 2015-09-29 DIAGNOSIS — I10 Essential (primary) hypertension: Secondary | ICD-10-CM | POA: Insufficient documentation

## 2015-09-29 DIAGNOSIS — Z95828 Presence of other vascular implants and grafts: Secondary | ICD-10-CM

## 2015-09-29 DIAGNOSIS — Z87891 Personal history of nicotine dependence: Secondary | ICD-10-CM | POA: Insufficient documentation

## 2015-09-29 DIAGNOSIS — Z419 Encounter for procedure for purposes other than remedying health state, unspecified: Secondary | ICD-10-CM

## 2015-09-29 DIAGNOSIS — F419 Anxiety disorder, unspecified: Secondary | ICD-10-CM | POA: Insufficient documentation

## 2015-09-29 DIAGNOSIS — E039 Hypothyroidism, unspecified: Secondary | ICD-10-CM | POA: Insufficient documentation

## 2015-09-29 DIAGNOSIS — C50912 Malignant neoplasm of unspecified site of left female breast: Secondary | ICD-10-CM | POA: Diagnosis not present

## 2015-09-29 DIAGNOSIS — K219 Gastro-esophageal reflux disease without esophagitis: Secondary | ICD-10-CM | POA: Insufficient documentation

## 2015-09-29 DIAGNOSIS — F329 Major depressive disorder, single episode, unspecified: Secondary | ICD-10-CM | POA: Insufficient documentation

## 2015-09-29 DIAGNOSIS — C50411 Malignant neoplasm of upper-outer quadrant of right female breast: Secondary | ICD-10-CM

## 2015-09-29 HISTORY — PX: PORTACATH PLACEMENT: SHX2246

## 2015-09-29 HISTORY — DX: Malignant neoplasm of unspecified site of unspecified female breast: C50.919

## 2015-09-29 SURGERY — INSERTION, TUNNELED CENTRAL VENOUS DEVICE, WITH PORT
Anesthesia: General | Site: Chest | Laterality: Left

## 2015-09-29 MED ORDER — ACETAMINOPHEN 650 MG RE SUPP: 650.0000 mg | RECTAL | Status: DC | PRN

## 2015-09-29 MED ORDER — MORPHINE SULFATE (PF) 2 MG/ML IV SOLN: 1.0000 mg | INTRAVENOUS | Status: DC | PRN

## 2015-09-29 MED ORDER — FENTANYL CITRATE (PF) 100 MCG/2ML IJ SOLN: 25.0000 ug | INTRAMUSCULAR | Status: DC | PRN

## 2015-09-29 MED ORDER — LIDOCAINE-PRILOCAINE 2.5-2.5 % EX CREA: 1.0000 "application " | TOPICAL_CREAM | CUTANEOUS | Status: DC | PRN

## 2015-09-29 MED ORDER — MIDAZOLAM HCL 5 MG/5ML IJ SOLN
INTRAMUSCULAR | Status: DC | PRN
  Administered 2015-09-29: 2 mg via INTRAVENOUS

## 2015-09-29 MED ORDER — ONDANSETRON HCL 4 MG/2ML IJ SOLN: 4.0000 mg | Freq: Four times a day (QID) | INTRAMUSCULAR | Status: DC | PRN

## 2015-09-29 MED ORDER — LIDOCAINE HCL (CARDIAC) 20 MG/ML IV SOLN
INTRAVENOUS | Status: DC | PRN
  Administered 2015-09-29: 80 mg via INTRAVENOUS

## 2015-09-29 MED ORDER — ACETAMINOPHEN 325 MG PO TABS: 650.0000 mg | ORAL_TABLET | ORAL | Status: DC | PRN

## 2015-09-29 MED ORDER — LIDOCAINE HCL (PF) 1 % IJ SOLN
INTRAMUSCULAR | Status: AC
  Filled 2015-09-29: qty 30

## 2015-09-29 MED ORDER — TRAMADOL HCL 50 MG PO TABS: 50.0000 mg | ORAL_TABLET | Freq: Four times a day (QID) | ORAL | Status: DC | PRN

## 2015-09-29 MED ORDER — DEXAMETHASONE SODIUM PHOSPHATE 10 MG/ML IJ SOLN
INTRAMUSCULAR | Status: DC | PRN
  Administered 2015-09-29: 8 mg via INTRAVENOUS

## 2015-09-29 MED ORDER — BUPIVACAINE HCL (PF) 0.25 % IJ SOLN
INTRAMUSCULAR | Status: DC | PRN
  Administered 2015-09-29: 10 mL

## 2015-09-29 MED ORDER — ONDANSETRON HCL 4 MG/2ML IJ SOLN
INTRAMUSCULAR | Status: DC | PRN
  Administered 2015-09-29: 4 mg via INTRAVENOUS

## 2015-09-29 MED ORDER — SODIUM CHLORIDE 0.9% FLUSH: 3.0000 mL | INTRAVENOUS | Status: DC | PRN

## 2015-09-29 MED ORDER — SODIUM CHLORIDE 0.9 % IV SOLN: 250.0000 mL | INTRAVENOUS | Status: DC | PRN

## 2015-09-29 MED ORDER — HEPARIN SOD (PORK) LOCK FLUSH 100 UNIT/ML IV SOLN
INTRAVENOUS | Status: DC | PRN
  Administered 2015-09-29: 500 [IU]

## 2015-09-29 MED ORDER — PROPOFOL 10 MG/ML IV BOLUS
INTRAVENOUS | Status: AC
  Filled 2015-09-29: qty 20

## 2015-09-29 MED ORDER — SODIUM CHLORIDE 0.9% FLUSH: 3.0000 mL | Freq: Two times a day (BID) | INTRAVENOUS | Status: DC

## 2015-09-29 MED ORDER — OXYCODONE HCL 5 MG PO TABS: 5.0000 mg | ORAL_TABLET | Freq: Once | ORAL | Status: DC | PRN

## 2015-09-29 MED ORDER — HEPARIN SOD (PORK) LOCK FLUSH 100 UNIT/ML IV SOLN
INTRAVENOUS | Status: AC
  Filled 2015-09-29: qty 5

## 2015-09-29 MED ORDER — OXYCODONE HCL 5 MG PO TABS: 5.0000 mg | ORAL_TABLET | ORAL | Status: DC | PRN

## 2015-09-29 MED ORDER — LACTATED RINGERS IV SOLN
INTRAVENOUS | Status: DC
  Administered 2015-09-29 (×2): via INTRAVENOUS

## 2015-09-29 MED ORDER — DEXAMETHASONE SODIUM PHOSPHATE 10 MG/ML IJ SOLN
INTRAMUSCULAR | Status: AC
  Filled 2015-09-29: qty 1

## 2015-09-29 MED ORDER — 0.9 % SODIUM CHLORIDE (POUR BTL) OPTIME
TOPICAL | Status: DC | PRN
  Administered 2015-09-29: 1000 mL

## 2015-09-29 MED ORDER — OXYCODONE HCL 5 MG/5ML PO SOLN
5.0000 mg | Freq: Once | ORAL | Status: DC | PRN
Start: 2015-09-29 — End: 2015-09-29

## 2015-09-29 MED ORDER — PROPOFOL 10 MG/ML IV BOLUS
INTRAVENOUS | Status: DC | PRN
  Administered 2015-09-29: 200 mg via INTRAVENOUS

## 2015-09-29 MED ORDER — FENTANYL CITRATE (PF) 250 MCG/5ML IJ SOLN
INTRAMUSCULAR | Status: AC
  Filled 2015-09-29: qty 5

## 2015-09-29 MED ORDER — BUPIVACAINE HCL (PF) 0.25 % IJ SOLN
INTRAMUSCULAR | Status: AC
  Filled 2015-09-29: qty 30

## 2015-09-29 MED ORDER — MIDAZOLAM HCL 2 MG/2ML IJ SOLN
INTRAMUSCULAR | Status: AC
  Filled 2015-09-29: qty 2

## 2015-09-29 MED ORDER — SODIUM CHLORIDE 0.9 % IV SOLN
INTRAVENOUS | Status: DC | PRN
  Administered 2015-09-29: 500 mL

## 2015-09-29 MED ORDER — FENTANYL CITRATE (PF) 100 MCG/2ML IJ SOLN
INTRAMUSCULAR | Status: DC | PRN
  Administered 2015-09-29: 25 ug via INTRAVENOUS

## 2015-09-29 SURGICAL SUPPLY — 50 items
BAG DECANTER FOR FLEXI CONT (MISCELLANEOUS) ×3 IMPLANT
BLADE SURG 15 STRL LF DISP TIS (BLADE) ×1 IMPLANT
BLADE SURG 15 STRL SS (BLADE) ×2
CHLORAPREP W/TINT 26ML (MISCELLANEOUS) ×3 IMPLANT
COVER SURGICAL LIGHT HANDLE (MISCELLANEOUS) ×3 IMPLANT
COVER TRANSDUCER ULTRASND GEL (DRAPE) IMPLANT
CRADLE DONUT ADULT HEAD (MISCELLANEOUS) ×3 IMPLANT
DRAPE C-ARM 42X72 X-RAY (DRAPES) ×3 IMPLANT
DRAPE CHEST BREAST 15X10 FENES (DRAPES) ×3 IMPLANT
DRAPE UTILITY XL STRL (DRAPES) ×6 IMPLANT
DRSG TEGADERM 4X4.75 (GAUZE/BANDAGES/DRESSINGS) ×3 IMPLANT
ELECT CAUTERY BLADE 6.4 (BLADE) ×3 IMPLANT
ELECT REM PT RETURN 9FT ADLT (ELECTROSURGICAL) ×3
ELECTRODE REM PT RTRN 9FT ADLT (ELECTROSURGICAL) ×1 IMPLANT
GAUZE SPONGE 2X2 8PLY STRL LF (GAUZE/BANDAGES/DRESSINGS) ×1 IMPLANT
GAUZE SPONGE 4X4 16PLY XRAY LF (GAUZE/BANDAGES/DRESSINGS) ×3 IMPLANT
GEL ULTRASOUND 20GR AQUASONIC (MISCELLANEOUS) IMPLANT
GLOVE BIO SURGEON STRL SZ7 (GLOVE) ×3 IMPLANT
GLOVE BIOGEL PI IND STRL 7.0 (GLOVE) ×1 IMPLANT
GLOVE BIOGEL PI INDICATOR 7.0 (GLOVE) ×2
GLOVE SURG SIGNA 7.5 PF LTX (GLOVE) ×6 IMPLANT
GOWN STRL REUS W/ TWL LRG LVL3 (GOWN DISPOSABLE) ×1 IMPLANT
GOWN STRL REUS W/ TWL XL LVL3 (GOWN DISPOSABLE) ×1 IMPLANT
GOWN STRL REUS W/TWL LRG LVL3 (GOWN DISPOSABLE) ×2
GOWN STRL REUS W/TWL XL LVL3 (GOWN DISPOSABLE) ×2
INTRODUCER COOK 11FR (CATHETERS) IMPLANT
KIT BASIN OR (CUSTOM PROCEDURE TRAY) ×3 IMPLANT
KIT PORT POWER 8FR ISP CVUE (Catheter) ×3 IMPLANT
KIT ROOM TURNOVER OR (KITS) ×3 IMPLANT
LIQUID BAND (GAUZE/BANDAGES/DRESSINGS) ×3 IMPLANT
NEEDLE HYPO 25GX1X1/2 BEV (NEEDLE) ×3 IMPLANT
NS IRRIG 1000ML POUR BTL (IV SOLUTION) ×3 IMPLANT
PACK SURGICAL SETUP 50X90 (CUSTOM PROCEDURE TRAY) ×3 IMPLANT
PAD ARMBOARD 7.5X6 YLW CONV (MISCELLANEOUS) ×3 IMPLANT
PENCIL BUTTON HOLSTER BLD 10FT (ELECTRODE) ×3 IMPLANT
SET INTRODUCER 12FR PACEMAKER (SHEATH) IMPLANT
SET SHEATH INTRODUCER 10FR (MISCELLANEOUS) IMPLANT
SHEATH COOK PEEL AWAY SET 9F (SHEATH) IMPLANT
SPONGE GAUZE 2X2 STER 10/PKG (GAUZE/BANDAGES/DRESSINGS) ×2
SUT MNCRL AB 4-0 PS2 18 (SUTURE) ×3 IMPLANT
SUT PROLENE 2 0 SH 30 (SUTURE) ×3 IMPLANT
SUT SILK 2 0 (SUTURE)
SUT SILK 2-0 18XBRD TIE 12 (SUTURE) IMPLANT
SUT VIC AB 3-0 SH 27 (SUTURE) ×2
SUT VIC AB 3-0 SH 27XBRD (SUTURE) ×1 IMPLANT
SYR 20ML ECCENTRIC (SYRINGE) ×6 IMPLANT
SYR 5ML LUER SLIP (SYRINGE) ×3 IMPLANT
SYR CONTROL 10ML LL (SYRINGE) ×3 IMPLANT
TOWEL OR 17X24 6PK STRL BLUE (TOWEL DISPOSABLE) ×3 IMPLANT
TOWEL OR 17X26 10 PK STRL BLUE (TOWEL DISPOSABLE) ×3 IMPLANT

## 2015-09-29 NOTE — Discharge Instructions (Signed)
Ok to shower starting tomorrow ° °Ice pack and ibuprofen also for pain °

## 2015-09-29 NOTE — Op Note (Signed)
INSERTION PORT-Hart-CATH  Procedure Note  Tracy Hart 09/29/2015   Pre-op Diagnosis: Breast cancer     Post-op Diagnosis: same  Procedure(s): INSERTION PORT-Hart-CATH LEFT SUBCLAVIAN VEIN (8 fr)  Surgeon(s): [name], MD  Anesthesia: General  Staff:  Circulator: Christen Bame, RN Radiology Technologist: Raina Mina Scrub Person: Jesse Sans, CST Float Surgical Tech: Burman Foster Teschner, CST; [facility]  Estimated Blood Loss: Minimal                         Tracy Hart   Date: 09/29/2015  Time: 2:01 PM

## 2015-09-29 NOTE — Interval H&P Note (Signed)
History and Physical Interval Note: no change in H and P  09/29/2015 1:06 PM  Tracy Hart  has presented today for surgery, with the diagnosis of Breast cancer  The various methods of treatment have been discussed with the patient and family. After consideration of risks, benefits and other options for treatment, the patient has consented to  Procedure(s): INSERTION PORT-A-CATH (N/A) as a surgical intervention .  The patient's history has been reviewed, patient examined, no change in status, stable for surgery.  I have reviewed the patient's chart and labs.  Questions were answered to the patient's satisfaction.     [name] A

## 2015-09-29 NOTE — Op Note (Signed)
NAMETELEAH, TAGER               ACCOUNT NO.:  [id number]  MEDICAL RECORD NO.:  [id number]  LOCATION:  [facility]                         FACILITY:  [facility]  PHYSICIAN:  [name], M.D. DATE OF BIRTH:  1961-07-30  DATE OF PROCEDURE:  09/29/2015 DATE OF DISCHARGE:  09/29/2015                              OPERATIVE REPORT   PREOPERATIVE DIAGNOSIS:  Breast cancer.  POSTOPERATIVE DIAGNOSIS:  Breast cancer.  PROCEDURE:  Left subclavian vein Port-A-Cath insertion.  SURGEON:  [name], M.D.  ANESTHESIA:  General and 0.25% Marcaine.  ESTIMATED BLOOD LOSS:  Minimal.  INDICATIONS:  This is a 55 year old female with recent diagnosis of breast cancer.  She now presents for Port-A-Cath insertion for intravenous chemotherapy.  FINDINGS:  An 8-French [place] Port-A-Cath was inserted into the left subclavian vein.  PROCEDURE IN DETAIL:  The patient was brought to the operating room, identified as Tracy Hart.  She was placed in supine on the operating room table, and general anesthesia was induced.  Her left neck and chest were prepped and draped in usual sterile fashion.  I anesthetized skin on the left chest and clavicle with Marcaine.  I then used the introducer needle with the patient in the Trendelenburg position to easily cannulate the left subclavian vein.  The guidewire was then passed through the needle and the needle was removed.  The guidewire was confirmed under fluoroscopy to be in the central venous system.  I then anesthetized the skin further with Marcaine.  I made an incision at the introducing site with a scalpel.  I then created a pocket for the port with the electrocautery.  An 8-French Clearview attachable port was then brought onto the field.  The port fit easily into the pocket.  I then connected the catheter to the port and secured it in place.  I then cut it appropriate length.  The venous dilator introducer sheath were then passed over the wire and  into central venous system.  I then removed the dilator and guidewire.  I then placed the port into the pocket and fed the catheter down the peel-away sheath.  The sheath was then peeled away leaving the catheter in central venous system.  Fluoroscopy confirmed good placement in the superior vena cava.  I then accessed the port and good flush and return were demonstrated.  I secured it to the chest wall with 2 separate 3-0 Prolene sutures.  I then closed subcutaneous tissue with interrupted 3-0 Vicryl sutures and closed the skin with running 4-0 Monocryl.  Skin glue was then applied.  I again accessed the port, good flush and return were demonstrated.  I instilled 5 mL of concentrated heparin solution into the port and catheter.  The patient tolerated the procedure well.  All the counts were correct at the end of procedure. The patient was then extubated in the operating room and taken in stable condition to recovery room.     [name], M.D.     DB/MEDQ  D:  09/29/2015  T:  09/29/2015  Job:  [id number]

## 2015-09-29 NOTE — Anesthesia Preprocedure Evaluation (Signed)
Anesthesia Evaluation  Patient identified by MRN, date of birth, ID band Patient awake    Reviewed: Allergy & Precautions, NPO status , Patient's Chart, lab work & pertinent test results  Airway Mallampati: II   Neck ROM: full    Dental   Pulmonary former smoker,    breath sounds clear to auscultation       Cardiovascular hypertension,  Rhythm:regular Rate:Normal     Neuro/Psych Anxiety Depression    GI/Hepatic hiatal hernia, GERD  ,  Endo/Other  Hypothyroidism   Renal/GU      Musculoskeletal   Abdominal   Peds  Hematology   Anesthesia Other Findings   Reproductive/Obstetrics                             Anesthesia Physical Anesthesia Plan  ASA: II  Anesthesia Plan: General   Post-op Pain Management:    Induction: Intravenous  Airway Management Planned: LMA  Additional Equipment:   Intra-op Plan:   Post-operative Plan:   Informed Consent: I have reviewed the patients History and Physical, chart, labs and discussed the procedure including the risks, benefits and alternatives for the proposed anesthesia with the patient or authorized representative who has indicated his/her understanding and acceptance.     Plan Discussed with: CRNA, Anesthesiologist and Surgeon  Anesthesia Plan Comments:         Anesthesia Quick Evaluation

## 2015-09-29 NOTE — Transfer of Care (Signed)
Immediate Anesthesia Transfer of Care Note  Patient: Tracy Hart  Procedure(s) Performed: Procedure(s): INSERTION PORT-A-CATH (Left)  Patient Location: PACU  Anesthesia Type:General  Level of Consciousness: awake, alert , oriented and patient cooperative  Airway & Oxygen Therapy: Patient Spontanous Breathing  Post-op Assessment: Report given to RN and Post -op Vital signs reviewed and stable  Post vital signs: Reviewed and stable  Last Vitals:  Filed Vitals:   09/29/15 1217  BP: 152/88  Pulse: 85  Temp: 36.8 C  Resp: 18    Complications: No apparent anesthesia complications

## 2015-09-29 NOTE — Anesthesia Postprocedure Evaluation (Signed)
Anesthesia Post Note  Patient: Tracy Hart  Procedure(s) Performed: Procedure(s) (LRB): INSERTION PORT-A-CATH (Left)  Patient location during evaluation: PACU Anesthesia Type: General Level of consciousness: awake and alert and patient cooperative Pain management: pain level controlled Vital Signs Assessment: post-procedure vital signs reviewed and stable Respiratory status: spontaneous breathing and respiratory function stable Cardiovascular status: stable Anesthetic complications: no    Last Vitals:  Filed Vitals:   09/29/15 1434 09/29/15 1439  BP: 120/74 113/98  Pulse: 72 74  Temp:    Resp: 2     Last Pain:  Filed Vitals:   09/29/15 1443  PainSc: [place]

## 2015-09-30 ENCOUNTER — Encounter (HOSPITAL_COMMUNITY): Payer: Self-pay | Admitting: Surgery

## 2015-10-03 ENCOUNTER — Ambulatory Visit (HOSPITAL_BASED_OUTPATIENT_CLINIC_OR_DEPARTMENT_OTHER): Payer: BLUE CROSS/BLUE SHIELD | Admitting: Nurse Practitioner

## 2015-10-03 ENCOUNTER — Encounter: Payer: Self-pay | Admitting: Nurse Practitioner

## 2015-10-03 VITALS — BP 124/74 | HR 77 | Temp 98.0°F | Resp 18 | Ht 68.0 in | Wt 213.3 lb

## 2015-10-03 DIAGNOSIS — C50411 Malignant neoplasm of upper-outer quadrant of right female breast: Secondary | ICD-10-CM | POA: Diagnosis not present

## 2015-10-03 MED ORDER — PROCHLORPERAZINE MALEATE 10 MG PO TABS: 10.0000 mg | ORAL_TABLET | Freq: Four times a day (QID) | ORAL | Status: DC | PRN

## 2015-10-03 MED ORDER — ONDANSETRON HCL 8 MG PO TABS: 8.0000 mg | ORAL_TABLET | Freq: Two times a day (BID) | ORAL | Status: DC | PRN

## 2015-10-03 MED ORDER — DEXAMETHASONE 4 MG PO TABS: 8.0000 mg | ORAL_TABLET | Freq: Two times a day (BID) | ORAL | Status: DC

## 2015-10-03 NOTE — Progress Notes (Signed)
Tracy Hart  [phone] [phone]     ID: Tracy Hart DOB: [birth year]  MR#: [id number]  ONG#:[id number]  Patient Care Team: Tracy Solian, MD as PCP - General (Internal Medicine) Tracy Hart Tracy Cristal, MD as Consulting Physician (Obstetrics and Gynecology) Tracy Cruel, MD as Consulting Physician (Oncology) Tracy Keens, MD as Consulting Physician (General Surgery) Tracy Rudd, MD as Consulting Physician (Radiation Oncology) PCP: Tracy Ringer, MD OTHER MD:  CHIEF COMPLAINT: Estrogen receptor positive breast cancer  CURRENT TREATMENT: Adjuvant treatment pending   BREAST CANCER HISTORY: From the original intake note:  "Tracy Hart" had routine screening mammography at Dr. [name] office late November, showing a possible mass in the right breast. On 08/04/2015 she underwent right diagnostic mammography with tomosynthesis and right breast ultrasonography at the breast Center. The breast density was category C. In the upper outer quadrant of the right breast there was a 0.8 cm area of asymmetry which was not palpable by exam. Ultrasound showed no suspicious mass or calcifications.  Biopsy of the area of asymmetry was performed 08/19/2015, and showed ([id number]) invasive and in situ ductal carcinoma, E-cadherin positive, estrogen receptor 90% positive, progesterone receptor 90% positive, both with strong staining intensity, with an MIB-1 of 5%, and no HER-2 amplification, the signals ratio being 1.44 and the number per cell 1.80.  The patient was then referred to surgery and after appropriate discussion underwent right lumpectomy and sentinel lymph node sampling 09/01/2015. The pathology from this procedure ([id number]) confirmed an invasive ductal carcinoma, grade 2, measuring 1.7 cm, with some low-grade ductal carcinoma in situ. Margins were negative. All 5 sentinel lymph nodes were clear.  The patient's subsequent history is as detailed  below.  INTERVAL HISTORY: Tracy Hart returns today for follow-up of her estrogen receptor positive breast cancer, accompanied by her husband Tracy Hart. This upcoming Tuesday she will start cyclophosphamide and docetaxel x4, given every 3 weeks with neulasta onpro for granulocyte support. She had her port placed last week and is here to discuss her antiemetic regimen for upcoming adjuvant chemotherapy.   REVIEW OF SYSTEMS: The patient denies unusual headaches, visual changes, nausea, vomiting, stiff neck, dizziness, or gait imbalance. There has been no cough, phlegm production, or pleurisy, no chest pain or pressure, and no change in bowel or bladder habits. The patient denies fever, rash, bleeding, unexplained fatigue or unexplained weight loss. Tracy Hart tolerated her recent surgery well, with no unusual pain, fever, or bleeding. She admits to mild seasonal allergies. She exercises by walking usually 30 minutes 5 times a week. A detailed review of systems was otherwise entirely negative.  PAST MEDICAL HISTORY: Past Medical History  Diagnosis Date  . Cancer [facility]) 2016    right breast  . Breast cancer ([place]) 08/19/15    right breast  . Anxiety     takes Xanax daily as needed  . Hypertension     takes Benicar daily  . Hypothyroidism     takes Synthroid daily  . Depression     takes Lexapro daily  . Seasonal allergies     takes Zyrtec and uses Flonase daily  . Hyperlipidemia     takes Atorvastatin daily  . History of bronchitis 2000  . History of hiatal hernia   . GERD (gastroesophageal reflux disease)   . Insomnia     takes Trazodone nightly    PAST SURGICAL HISTORY: Past Surgical History  Procedure Laterality Date  . Breast lumpectomy with radioactive seed and sentinel lymph node biopsy Right 09/01/2015  Procedure: RIGHT BREAST LUMPECTOMY WITH RADIOACTIVE SEED LOCALIZED AND SENTINEL LYMPH NODE BIOPSY;  Surgeon: Tracy Keens, MD;  Location: [facility];  Service: General;   Laterality: Right;  . Tonsillectomy  at age 75  . Hernia repair Right 2009  . Esophagogastroduodenoscopy    . Colonoscopy    . Portacath placement Left 09/29/2015    Procedure: INSERTION PORT-A-CATH;  Surgeon: Tracy Keens, MD;  Location: [facility] OR;  Service: General;  Laterality: Left;    FAMILY HISTORY Family History  Problem Relation Age of Onset  . Cancer Mother 67    breast  . Diabetes Mother   . Hypertension Mother   . Hypertension Father   . Cancer Sister 30    thyroid  . Seizures Sister    The patient's parents are in their early 86s as of January 2017. The patient had no brothers, 3 sisters. The patient's mother was diagnosed with breast cancer at age 6. The patient's sister was diagnosed with thyroid cancer at age 22. There is no history of ovarian cancer in the family.   GYNECOLOGIC HISTORY:  No LMP recorded. Patient is postmenopausal. Menarche age 16, the patient is GX P0. She was on oral contraceptives between 1996 and 2016, and at the time of her diagnosis had been on hormone replacement for 2 months.   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 Her husband Tracy Hart") works in Occupational hygienist for a Technical sales engineer. At home is just the 2 of them, with no pets. The patient at tends [place]    ADVANCED DIRECTIVES: In place. The patient's husband is her healthcare power of attorney with her sister Tracy Hart   HEALTH MAINTENANCE: Social History  Substance Use Topics  . Smoking status: Former Research scientist (life sciences)  . Smokeless tobacco: Not on file     Comment: quit smoking around 1990  . Alcohol Use: No     Colonoscopy: 2010/[facility]  PAP: November 2016  Bone density: Due/ at Dr [name]  Lipid panel:  Allergies  Allergen Reactions  . Codeine     No percocet, makes patient weird  . Sulfa Antibiotics Other (See Comments)    Caused mouth sores    Current Outpatient Prescriptions   Medication Sig Dispense Refill  . ALPRAZolam (XANAX) 0.5 MG tablet Take 0.25 mg by mouth at bedtime as needed for anxiety.     Marland Kitchen atorvastatin (LIPITOR) 40 MG tablet Take 40 mg by mouth daily.    . cetirizine (ZYRTEC) 10 MG tablet Take 10 mg by mouth daily.    . cholecalciferol (VITAMIN D) 400 UNITS TABS tablet Take 800 Units by mouth 2 (two) times daily.    Marland Kitchen escitalopram (LEXAPRO) 10 MG tablet Take 10 mg by mouth daily.    . fluticasone (FLONASE) 50 MCG/ACT nasal spray Place 1 spray into both nostrils daily.    Marland Kitchen levothyroxine (SYNTHROID, LEVOTHROID) 137 MCG tablet Take 137 mcg by mouth daily before breakfast.    . lidocaine-prilocaine (EMLA) cream Apply 1 application topically as needed. 30 g 3  . olmesartan-hydrochlorothiazide (BENICAR HCT) 40-25 MG per tablet Take 1 tablet by mouth daily.    . traMADol (ULTRAM) 50 MG tablet Take 1-2 tablets (50-100 mg total) by mouth every 6 (six) hours as needed. 20 tablet 0  . traZODone (DESYREL) 50 MG tablet Take 50 mg by mouth at bedtime.    Marland Kitchen aspirin 81 MG tablet Take 81 mg  by mouth daily. Reported on 10/03/2015    . calcium carbonate (OS-CAL) 600 MG TABS tablet Take 600 mg by mouth 2 (two) times daily with a meal. Reported on 10/03/2015    . dexamethasone (DECADRON) 4 MG tablet Take 2 tablets (8 mg total) by mouth 2 (two) times daily. Start the day before Taxotere. Then again the day after chemo for 3 days. 30 tablet 1  . Omega-3 Fatty Acids (FISH OIL) 1200 MG CAPS Take by mouth 2 (two) times daily. Reported on 10/03/2015    . ondansetron (ZOFRAN) 8 MG tablet Take 1 tablet (8 mg total) by mouth 2 (two) times daily as needed for refractory nausea / vomiting. Start on day 3 after chemo. 30 tablet 1  . prochlorperazine (COMPAZINE) 10 MG tablet Take 1 tablet (10 mg total) by mouth every 6 (six) hours as needed (Nausea or vomiting). 30 tablet 1   No current facility-administered medications for this visit.    OBJECTIVE: Middle-aged white woman in no acute  distress Filed Vitals:   10/03/15 1156  BP: 124/74  Pulse: 77  Temp: 98 F (36.7 C)  Resp: 18     Body mass index is 32.44 kg/(m^2).    ECOG FS:1 - Symptomatic but completely ambulatory  Skin: warm, dry  HEENT: sclerae anicteric, conjunctivae pink, oropharynx clear. No thrush or mucositis.  Lymph Nodes: No cervical or supraclavicular lymphadenopathy  Lungs: clear to auscultation bilaterally, no rales, wheezes, or rhonci  Heart: regular rate and rhythm  Abdomen: round, soft, non tender, positive bowel sounds  Musculoskeletal: No focal spinal tenderness, no peripheral edema  Neuro: non focal, well oriented, positive affect  Breasts: deferred. Newly placed left chest portacath, mildly bruised but healing well   LAB RESULTS:  CMP     Component Value Date/Time   NA 139 09/26/2015 0836   NA 135* 09/03/2015 1551   K 4.1 09/26/2015 0836   K 4.2 09/03/2015 1551   CL 105 09/26/2015 0836   CO2 28 09/26/2015 0836   CO2 27 09/03/2015 1551   GLUCOSE 109* 09/26/2015 0836   GLUCOSE 102 09/03/2015 1551   BUN 15 09/26/2015 0836   BUN 15.6 09/03/2015 1551   CREATININE 0.82 09/26/2015 0836   CREATININE 0.9 09/03/2015 1551   CALCIUM 9.8 09/26/2015 0836   CALCIUM 9.2 09/03/2015 1551   PROT 7.6 09/03/2015 1551   ALBUMIN 3.9 09/03/2015 1551   AST 30 09/03/2015 1551   ALT 31 09/03/2015 1551   ALKPHOS 83 09/03/2015 1551   BILITOT 0.43 09/03/2015 1551   GFRNONAA >60 09/26/2015 0836   GFRAA >60 09/26/2015 0836    INo results found for: SPEP, UPEP  Lab Results  Component Value Date   WBC 8.1 09/26/2015   NEUTROABS 5.8 09/03/2015   HGB 13.5 09/26/2015   HCT 38.5 09/26/2015   MCV 99.2 09/26/2015   PLT 276 09/26/2015      Chemistry      Component Value Date/Time   NA 139 09/26/2015 0836   NA 135* 09/03/2015 1551   K 4.1 09/26/2015 0836   K 4.2 09/03/2015 1551   CL 105 09/26/2015 0836   CO2 28 09/26/2015 0836   CO2 27 09/03/2015 1551   BUN 15 09/26/2015 0836   BUN 15.6  09/03/2015 1551   CREATININE 0.82 09/26/2015 0836   CREATININE 0.9 09/03/2015 1551      Component Value Date/Time   CALCIUM 9.8 09/26/2015 0836   CALCIUM 9.2 09/03/2015 1551   ALKPHOS 83 09/03/2015 1551  AST 30 09/03/2015 1551   ALT 31 09/03/2015 1551   BILITOT 0.43 09/03/2015 1551       No results found for: LABCA2  No components found for: LABCA125  No results for input(s): INR in the last 168 hours.  Urinalysis No results found for: COLORURINE, APPEARANCEUR, LABSPEC, PHURINE, GLUCOSEU, HGBUR, BILIRUBINUR, KETONESUR, PROTEINUR, UROBILINOGEN, NITRITE, LEUKOCYTESUR  STUDIES: Dg Chest Port 1 View  09/29/2015  CLINICAL DATA:  Port-A-Cath placement EXAM: PORTABLE CHEST 1 VIEW COMPARISON:  06/25/2008 FINDINGS: Left subclavian Port-A-Cath tip in the mid SVC.  No pneumothorax Lungs are clear without infiltrate or effusion. IMPRESSION: Port-A-Cath tip in the mid SVC. Electronically Signed   By: Franchot Gallo M.D.   On: 09/29/2015 14:36   Dg Fluoro Guide Cv Line-no Report  09/29/2015  CLINICAL DATA:  FLOURO GUIDE CV LINE Fluoroscopy was utilized by the requesting physician.  No radiographic interpretation.    ASSESSMENT: 55 y.o. status post right breast biopsy 08/19/2015 for a clinical T1b N0, stage IA invasive ductal carcinoma, estrogen and progesterone receptor positive, HER-2 not amplified, with an MIB-1 of 5%  (1) status post right lumpectomy and sentinel lymph node sampling 09/01/2015 for a pT1c pN0, stage IA invasive ductal carcinoma, grade 2, with negative margins  (2) Oncotype DX score of 23 falls in the intermediate range, and predicts a risk of recurrence outside the breast within 10 years of 15%, if the patient's only systemic therapy is tamoxifen for 5 years. It also predicts an absolute risk reduction of 4% with added chemotherapy  (3) cyclophosphamide and docetaxel x 4, given every 21 days with neulasta onpro to start 10/06/15  (4) adjuvant radiation to follow  (5)  anti-estrogens to follow radiation  PLAN: Tracy Hart and I spent 40 minutes discussing her upcoming chemotherapy plans. She was made aware of potential side effects and toxicities of cyclophosphamide and docetaxel. She attended chemotherapy school last week so a lot of this information was a review for her. She was made aware of her neulasta injections, including the fact that these will be administered with an onbody injector that she will need to wear until the device is no longer active.   We reviewed her antiemetic schedule, and she feels comfortable with every medication listed as well as the indication and potential side effects. These medicines were sent to her pharmacy today. I did not prescribe the lorazepam since she already has xanax and uses this nightly.  Tracy Hart will return on Tuesday for her first cycle of treatment. She understands and agrees with this plan. She knows the goal of treatment in her case is cure. She has been encouraged to call with any issues that might arise before her next visit here.  Laurie Panda, NP   10/03/2015 2:18 PM

## 2015-10-06 ENCOUNTER — Other Ambulatory Visit: Payer: Self-pay

## 2015-10-06 DIAGNOSIS — C50411 Malignant neoplasm of upper-outer quadrant of right female breast: Secondary | ICD-10-CM

## 2015-10-07 ENCOUNTER — Telehealth: Payer: Self-pay | Admitting: Oncology

## 2015-10-07 ENCOUNTER — Ambulatory Visit: Payer: BLUE CROSS/BLUE SHIELD

## 2015-10-07 ENCOUNTER — Other Ambulatory Visit: Payer: Self-pay

## 2015-10-07 ENCOUNTER — Other Ambulatory Visit: Payer: Self-pay | Admitting: Oncology

## 2015-10-07 ENCOUNTER — Other Ambulatory Visit (HOSPITAL_BASED_OUTPATIENT_CLINIC_OR_DEPARTMENT_OTHER): Payer: BLUE CROSS/BLUE SHIELD

## 2015-10-07 DIAGNOSIS — C50411 Malignant neoplasm of upper-outer quadrant of right female breast: Secondary | ICD-10-CM | POA: Diagnosis not present

## 2015-10-07 LAB — CBC WITH DIFFERENTIAL/PLATELET
BASO%: 0.5 % (ref 0.0–2.0)
BASOS ABS: 0.1 10*3/uL (ref 0.0–0.1)
EOS ABS: 0 10*3/uL (ref 0.0–0.5)
EOS%: 0 % (ref 0.0–7.0)
HCT: 38.3 % (ref 34.8–46.6)
HEMOGLOBIN: 13.1 g/dL (ref 11.6–15.9)
LYMPH%: 9.9 % — ABNORMAL LOW (ref 14.0–49.7)
MCH: 33.7 pg (ref 25.1–34.0)
MCHC: 34.1 g/dL (ref 31.5–36.0)
MCV: 98.7 fL (ref 79.5–101.0)
MONO#: 0.7 10*3/uL (ref 0.1–0.9)
MONO%: 3.5 % (ref 0.0–14.0)
NEUT#: 17.1 10*3/uL — ABNORMAL HIGH (ref 1.5–6.5)
NEUT%: 86.1 % — AB (ref 38.4–76.8)
Platelets: 269 10*3/uL (ref 145–400)
RBC: 3.88 10*6/uL (ref 3.70–5.45)
RDW: 11.8 % (ref 11.2–14.5)
WBC: 19.8 10*3/uL — ABNORMAL HIGH (ref 3.9–10.3)
lymph#: 2 10*3/uL (ref 0.9–3.3)

## 2015-10-07 LAB — COMPREHENSIVE METABOLIC PANEL
ALBUMIN: 4.1 g/dL (ref 3.5–5.0)
ALK PHOS: 80 U/L (ref 40–150)
ALT: 23 U/L (ref 0–55)
AST: 17 U/L (ref 5–34)
Anion Gap: 11 mEq/L (ref 3–11)
BUN: 21.1 mg/dL (ref 7.0–26.0)
CALCIUM: 9.5 mg/dL (ref 8.4–10.4)
CO2: 23 mEq/L (ref 22–29)
Chloride: 101 mEq/L (ref 98–109)
Creatinine: 0.9 mg/dL (ref 0.6–1.1)
EGFR: 76 mL/min/{1.73_m2} — AB (ref >90)
GLUCOSE: 162 mg/dL — AB (ref 70–140)
POTASSIUM: 3.7 meq/L (ref 3.5–5.1)
SODIUM: 134 meq/L — AB (ref 136–145)
Total Bilirubin: 0.43 mg/dL (ref 0.20–1.20)
Total Protein: 8.1 g/dL (ref 6.4–8.3)

## 2015-10-07 NOTE — Progress Notes (Signed)
Pt entered center for treatment, WBC noted at 19.8. Dr. Marjie Skiff  Notified.  Treatment delay per Magrinat's order.

## 2015-10-07 NOTE — Telephone Encounter (Signed)
R/s treatment appointment for next week. All other appointments remain for now. Per 2/14 pof

## 2015-10-08 ENCOUNTER — Encounter: Payer: Self-pay | Admitting: Oncology

## 2015-10-08 NOTE — Progress Notes (Signed)
Went to meet with patient in treatment area on Tuesday 10/07/15 to introduce myself as a Estate manager/land agent, to offer possible assistance, and see if she had any financial questions or concerns. In the meanwhile John RN came in to inform patient she was not going to be able to have treatment and would be leaving shortyl. I informed patient she could come by my office when she was discharged and we could meet. Patient verbalized understanding and states she knew where my office was. I intentionally did not take lunch so that I wouldn't miss the patient. My plan was to give her ACS referral, Cancer Care, and go over grant. Patient did not show.

## 2015-10-08 NOTE — Progress Notes (Signed)
In addition to my previous note, was going to discuss Neulasta copay assistance.

## 2015-10-09 ENCOUNTER — Ambulatory Visit: Payer: BLUE CROSS/BLUE SHIELD

## 2015-10-13 ENCOUNTER — Other Ambulatory Visit: Payer: Self-pay | Admitting: *Deleted

## 2015-10-14 ENCOUNTER — Other Ambulatory Visit: Payer: Self-pay | Admitting: Oncology

## 2015-10-14 ENCOUNTER — Encounter: Payer: Self-pay | Admitting: *Deleted

## 2015-10-14 ENCOUNTER — Ambulatory Visit (HOSPITAL_BASED_OUTPATIENT_CLINIC_OR_DEPARTMENT_OTHER): Payer: BLUE CROSS/BLUE SHIELD

## 2015-10-14 ENCOUNTER — Other Ambulatory Visit (HOSPITAL_BASED_OUTPATIENT_CLINIC_OR_DEPARTMENT_OTHER): Payer: BLUE CROSS/BLUE SHIELD

## 2015-10-14 ENCOUNTER — Ambulatory Visit (HOSPITAL_BASED_OUTPATIENT_CLINIC_OR_DEPARTMENT_OTHER): Payer: BLUE CROSS/BLUE SHIELD | Admitting: Nurse Practitioner

## 2015-10-14 ENCOUNTER — Encounter: Payer: Self-pay | Admitting: Nurse Practitioner

## 2015-10-14 VITALS — BP 126/69 | HR 75 | Temp 98.0°F | Resp 19

## 2015-10-14 VITALS — BP 120/64 | HR 95 | Temp 97.9°F | Resp 19 | Wt 210.9 lb

## 2015-10-14 DIAGNOSIS — D72829 Elevated white blood cell count, unspecified: Secondary | ICD-10-CM

## 2015-10-14 DIAGNOSIS — C50911 Malignant neoplasm of unspecified site of right female breast: Secondary | ICD-10-CM

## 2015-10-14 DIAGNOSIS — C50411 Malignant neoplasm of upper-outer quadrant of right female breast: Secondary | ICD-10-CM

## 2015-10-14 DIAGNOSIS — Z5111 Encounter for antineoplastic chemotherapy: Secondary | ICD-10-CM | POA: Diagnosis not present

## 2015-10-14 LAB — COMPREHENSIVE METABOLIC PANEL
ALBUMIN: 4 g/dL (ref 3.5–5.0)
ALK PHOS: 80 U/L (ref 40–150)
ALT: 24 U/L (ref 0–55)
AST: 17 U/L (ref 5–34)
Anion Gap: 11 mEq/L (ref 3–11)
BILIRUBIN TOTAL: 0.52 mg/dL (ref 0.20–1.20)
BUN: 23.1 mg/dL (ref 7.0–26.0)
CO2: 22 mEq/L (ref 22–29)
Calcium: 9.8 mg/dL (ref 8.4–10.4)
Chloride: 101 mEq/L (ref 98–109)
Creatinine: 0.8 mg/dL (ref 0.6–1.1)
EGFR: 81 mL/min/{1.73_m2} — AB (ref >90)
GLUCOSE: 158 mg/dL — AB (ref 70–140)
Potassium: 4.1 mEq/L (ref 3.5–5.1)
SODIUM: 134 meq/L — AB (ref 136–145)
TOTAL PROTEIN: 7.9 g/dL (ref 6.4–8.3)

## 2015-10-14 LAB — URINALYSIS, MICROSCOPIC - CHCC
BILIRUBIN (URINE): NEGATIVE
BLOOD: NEGATIVE
Glucose: NEGATIVE mg/dL
KETONES: NEGATIVE mg/dL
Leukocyte Esterase: NEGATIVE
Nitrite: NEGATIVE
PH: 6 (ref 4.6–8.0)
PROTEIN: NEGATIVE mg/dL
RBC / HPF: NEGATIVE (ref 0–2)
Specific Gravity, Urine: 1.005 (ref 1.003–1.035)
Urobilinogen, UR: 0.2 mg/dL (ref 0.2–1)
WBC, UA: NEGATIVE (ref 0–2)

## 2015-10-14 LAB — CBC WITH DIFFERENTIAL/PLATELET
BASO%: 0.9 % (ref 0.0–2.0)
Basophils Absolute: 0.1 10*3/uL (ref 0.0–0.1)
EOS ABS: 0 10*3/uL (ref 0.0–0.5)
EOS%: 0 % (ref 0.0–7.0)
HCT: 38.5 % (ref 34.8–46.6)
HEMOGLOBIN: 13.3 g/dL (ref 11.6–15.9)
LYMPH#: 1.6 10*3/uL (ref 0.9–3.3)
LYMPH%: 9.4 % — ABNORMAL LOW (ref 14.0–49.7)
MCH: 34.2 pg — ABNORMAL HIGH (ref 25.1–34.0)
MCHC: 34.6 g/dL (ref 31.5–36.0)
MCV: 99 fL (ref 79.5–101.0)
MONO#: 0.4 10*3/uL (ref 0.1–0.9)
MONO%: 2.4 % (ref 0.0–14.0)
NEUT%: 87.3 % — ABNORMAL HIGH (ref 38.4–76.8)
NEUTROS ABS: 14.6 10*3/uL — AB (ref 1.5–6.5)
PLATELETS: 259 10*3/uL (ref 145–400)
RBC: 3.89 10*6/uL (ref 3.70–5.45)
RDW: 11.8 % (ref 11.2–14.5)
WBC: 16.7 10*3/uL — AB (ref 3.9–10.3)

## 2015-10-14 MED ORDER — HEPARIN SOD (PORK) LOCK FLUSH 100 UNIT/ML IV SOLN
500.0000 [IU] | Freq: Once | INTRAVENOUS | Status: AC | PRN
  Administered 2015-10-14: 500 [IU]
  Filled 2015-10-14: qty 5

## 2015-10-14 MED ORDER — PALONOSETRON HCL INJECTION 0.25 MG/5ML
INTRAVENOUS | Status: AC
  Filled 2015-10-14: qty 5

## 2015-10-14 MED ORDER — SODIUM CHLORIDE 0.9 % IV SOLN
Freq: Once | INTRAVENOUS | Status: AC
  Administered 2015-10-14: 10:00:00 via INTRAVENOUS

## 2015-10-14 MED ORDER — PALONOSETRON HCL INJECTION 0.25 MG/5ML
0.2500 mg | Freq: Once | INTRAVENOUS | Status: AC
  Administered 2015-10-14: 0.25 mg via INTRAVENOUS

## 2015-10-14 MED ORDER — SODIUM CHLORIDE 0.9 % IV SOLN
600.0000 mg/m2 | Freq: Once | INTRAVENOUS | Status: AC
  Administered 2015-10-14: 1300 mg via INTRAVENOUS
  Filled 2015-10-14: qty 65

## 2015-10-14 MED ORDER — DOCETAXEL CHEMO INJECTION 160 MG/16ML
75.0000 mg/m2 | Freq: Once | INTRAVENOUS | Status: AC
  Administered 2015-10-14: 160 mg via INTRAVENOUS
  Filled 2015-10-14: qty 16

## 2015-10-14 MED ORDER — SODIUM CHLORIDE 0.9% FLUSH
10.0000 mL | INTRAVENOUS | Status: DC | PRN
  Administered 2015-10-14: 10 mL
  Filled 2015-10-14: qty 10

## 2015-10-14 MED ORDER — SODIUM CHLORIDE 0.9 % IV SOLN
10.0000 mg | Freq: Once | INTRAVENOUS | Status: AC
  Administered 2015-10-14: 10 mg via INTRAVENOUS
  Filled 2015-10-14: qty 1

## 2015-10-14 NOTE — Progress Notes (Signed)
Patient will receive treatment today per Nira Conn NP with elevated white blood cell count - Rogenia Werntz E RN notified of this.

## 2015-10-14 NOTE — Progress Notes (Signed)
Pt did not receive Neulasta today per Nira Conn NP. Pt aware and verbalizes understanding.

## 2015-10-14 NOTE — Progress Notes (Addendum)
[place]  [phone] [phone]     ID: Tracy Hart DOB: 19-Feb-1961  MR#: [id number]  UGQ#:[id number]  Patient Care Team: Prince Solian, MD as PCP - General (Internal Medicine) [name] [name], MD as Consulting Physician (Obstetrics and Gynecology) [name], MD as Consulting Physician (Oncology) [name], MD as Consulting Physician (General Surgery) [name], MD as Consulting Physician (Radiation Oncology) PCP: [name], MD OTHER MD:  CHIEF COMPLAINT: Estrogen receptor positive breast cancer  CURRENT TREATMENT: Adjuvant treatment pending   BREAST CANCER HISTORY: From the original intake note:  "Tracy Hart" had routine screening mammography at Dr. [name] office late November, showing a possible mass in the right breast. On 08/04/2015 she underwent right diagnostic mammography with tomosynthesis and right breast ultrasonography at the breast Center. The breast density was category C. In the upper outer quadrant of the right breast there was a 0.8 cm area of asymmetry which was not palpable by exam. Ultrasound showed no suspicious mass or calcifications.  Biopsy of the area of asymmetry was performed 08/19/2015, and showed ([id number]) invasive and in situ ductal carcinoma, E-cadherin positive, estrogen receptor 90% positive, progesterone receptor 90% positive, both with strong staining intensity, with an MIB-1 of 5%, and no HER-2 amplification, the signals ratio being 1.44 and the number per cell 1.80.  The patient was then referred to surgery and after appropriate discussion underwent right lumpectomy and sentinel lymph node sampling 09/01/2015. The pathology from this procedure ([id number]) confirmed an invasive ductal carcinoma, grade 2, measuring 1.7 cm, with some low-grade ductal carcinoma in situ. Margins were negative. All 5 sentinel lymph nodes were clear.  The patient's subsequent history is as detailed  below.  INTERVAL HISTORY: Tracy Hart returns today for follow-up of her estrogen receptor positive breast cancer, accompanied by her husband Tracy Hart. Today is day 1, cycle 1 of cyclophosphamide and docetaxel. The start of this treatment was delayed due to a high WBC of 19.8. She though she had been around some sick people the weekend prior, but barely developed any sinus symptoms. She denies fevers or chills. She is having no trouble urinating. Her port site is not red or swollen.   REVIEW OF SYSTEMS: Tracy Hart used the week off to get some exercise in. She has no physical complaints to offer. A detailed review of systems is completely negative.   PAST MEDICAL HISTORY: Past Medical History  Diagnosis Date  . Cancer [facility]) 2016    right breast  . Breast cancer ([place]) 08/19/15    right breast  . Anxiety     takes Xanax daily as needed  . Hypertension     takes Benicar daily  . Hypothyroidism     takes Synthroid daily  . Depression     takes Lexapro daily  . Seasonal allergies     takes Zyrtec and uses Flonase daily  . Hyperlipidemia     takes Atorvastatin daily  . History of bronchitis 2000  . History of hiatal hernia   . GERD (gastroesophageal reflux disease)   . Insomnia     takes Trazodone nightly    PAST SURGICAL HISTORY: Past Surgical History  Procedure Laterality Date  . Breast lumpectomy with radioactive seed and sentinel lymph node biopsy Right 09/01/2015    Procedure: RIGHT BREAST LUMPECTOMY WITH RADIOACTIVE SEED LOCALIZED AND SENTINEL LYMPH NODE BIOPSY;  Surgeon: [name], MD;  Location: [place];  Service: General;  Laterality: Right;  . Tonsillectomy  at age 18  .  Hernia repair Right 2009  . Esophagogastroduodenoscopy    . Colonoscopy    . Portacath placement Left 09/29/2015    Procedure: INSERTION PORT-A-CATH;  Surgeon: Abigail Miyamoto, MD;  Location: [facility] OR;  Service: General;  Laterality: Left;    FAMILY HISTORY Family History  Problem Relation Age  of Onset  . Cancer Mother 22    breast  . Diabetes Mother   . Hypertension Mother   . Hypertension Father   . Cancer Sister 50    thyroid  . Seizures Sister    The patient's parents are in their early 41s as of January 2017. The patient had no brothers, 3 sisters. The patient's mother was diagnosed with breast cancer at age 66. The patient's sister was diagnosed with thyroid cancer at age 52. There is no history of ovarian cancer in the family.   GYNECOLOGIC HISTORY:  No LMP recorded. Patient is postmenopausal. Menarche age 22, the patient is GX P0. She was on oral contraceptives between 1996 and 2016, and at the time of her diagnosis had been on hormone replacement for 2 months.   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 Her husband Tracy Hart") works in Hospital doctor for a Patent attorney. At home is just the 2 of them, with no pets. The patient at tends [facility]. [facility]    ADVANCED DIRECTIVES: In place. The patient's husband is her healthcare power of attorney with her sister Tracy Hart listed as secondary   HEALTH MAINTENANCE: Social History  Substance Use Topics  . Smoking status: Former Games developer  . Smokeless tobacco: Not on file     Comment: quit smoking around 1990  . Alcohol Use: No     Colonoscopy: 2010/[facility]  PAP: November 2016  Bone density: Due/ at Dr Donnetta Hail  Lipid panel:  Allergies  Allergen Reactions  . Codeine     No percocet, makes patient weird  . Sulfa Antibiotics Other (See Comments)    Caused mouth sores    Current Outpatient Prescriptions  Medication Sig Dispense Refill  . ALPRAZolam (XANAX) 0.5 MG tablet Take 0.25 mg by mouth at bedtime as needed for anxiety.     Marland Kitchen aspirin 81 MG tablet Take 81 mg by mouth daily. Reported on 10/03/2015    . atorvastatin (LIPITOR) 40 MG tablet Take 40 mg by mouth daily.    . calcium carbonate (OS-CAL) 600 MG TABS tablet Take 600 mg by mouth 2 (two) times  daily with a meal. Reported on 10/03/2015    . cetirizine (ZYRTEC) 10 MG tablet Take 10 mg by mouth daily.    . cholecalciferol (VITAMIN D) 400 UNITS TABS tablet Take 800 Units by mouth 2 (two) times daily.    Marland Kitchen dexamethasone (DECADRON) 4 MG tablet Take 2 tablets (8 mg total) by mouth 2 (two) times daily. Start the day before Taxotere. Then again the day after chemo for 3 days. 30 tablet 1  . escitalopram (LEXAPRO) 10 MG tablet Take 10 mg by mouth daily.    Marland Kitchen levothyroxine (SYNTHROID, LEVOTHROID) 137 MCG tablet Take 137 mcg by mouth daily before breakfast.    . lidocaine-prilocaine (EMLA) cream Apply 1 application topically as needed. 30 g 3  . olmesartan-hydrochlorothiazide (BENICAR HCT) 40-25 MG per tablet Take 1 tablet by mouth daily.    . Omega-3 Fatty Acids (FISH OIL) 1200 MG CAPS Take by mouth 2 (two) times daily. Reported on 10/03/2015    . traZODone (DESYREL)  50 MG tablet Take 50 mg by mouth at bedtime.    . fluticasone (FLONASE) 50 MCG/ACT nasal spray Place 1 spray into both nostrils daily. Reported on 10/14/2015    . ondansetron (ZOFRAN) 8 MG tablet Take 1 tablet (8 mg total) by mouth 2 (two) times daily as needed for refractory nausea / vomiting. Start on day 3 after chemo. (Patient not taking: Reported on 10/14/2015) 30 tablet 1  . prochlorperazine (COMPAZINE) 10 MG tablet Take 1 tablet (10 mg total) by mouth every 6 (six) hours as needed (Nausea or vomiting). (Patient not taking: Reported on 10/14/2015) 30 tablet 1  . traMADol (ULTRAM) 50 MG tablet Take 1-2 tablets (50-100 mg total) by mouth every 6 (six) hours as needed. (Patient not taking: Reported on 10/14/2015) 20 tablet 0   No current facility-administered medications for this visit.    OBJECTIVE: Middle-aged white woman in no acute distress Filed Vitals:   10/14/15 0900  BP: 120/64  Pulse: 95  Temp: 97.9 F (36.6 C)  Resp: 19     Body mass index is 32.07 kg/(m^2).    ECOG FS:1 - Symptomatic but completely ambulatory  Sclerae  unicteric, pupils round and equal Oropharynx clear and moist-- no thrush or other lesions No cervical or supraclavicular adenopathy Lungs no rales or rhonchi Heart regular rate and rhythm Abd soft, nontender, positive bowel sounds MSK no focal spinal tenderness, no upper extremity lymphedema Neuro: nonfocal, well oriented, appropriate affect Breasts: deferred. Left chest portacath, well healed with no redness   LAB RESULTS:  CMP     Component Value Date/Time   NA 134* 10/14/2015 0848   NA 139 09/26/2015 0836   K 4.1 10/14/2015 0848   K 4.1 09/26/2015 0836   CL 105 09/26/2015 0836   CO2 22 10/14/2015 0848   CO2 28 09/26/2015 0836   GLUCOSE 158* 10/14/2015 0848   GLUCOSE 109* 09/26/2015 0836   BUN 23.1 10/14/2015 0848   BUN 15 09/26/2015 0836   CREATININE 0.8 10/14/2015 0848   CREATININE 0.82 09/26/2015 0836   CALCIUM 9.8 10/14/2015 0848   CALCIUM 9.8 09/26/2015 0836   PROT 7.9 10/14/2015 0848   ALBUMIN 4.0 10/14/2015 0848   AST 17 10/14/2015 0848   ALT 24 10/14/2015 0848   ALKPHOS 80 10/14/2015 0848   BILITOT 0.52 10/14/2015 0848   GFRNONAA >60 09/26/2015 0836   GFRAA >60 09/26/2015 0836    INo results found for: SPEP, UPEP  Lab Results  Component Value Date   WBC 16.7* 10/14/2015   NEUTROABS 14.6* 10/14/2015   HGB 13.3 10/14/2015   HCT 38.5 10/14/2015   MCV 99.0 10/14/2015   PLT 259 10/14/2015      Chemistry      Component Value Date/Time   NA 134* 10/14/2015 0848   NA 139 09/26/2015 0836   K 4.1 10/14/2015 0848   K 4.1 09/26/2015 0836   CL 105 09/26/2015 0836   CO2 22 10/14/2015 0848   CO2 28 09/26/2015 0836   BUN 23.1 10/14/2015 0848   BUN 15 09/26/2015 0836   CREATININE 0.8 10/14/2015 0848   CREATININE 0.82 09/26/2015 0836      Component Value Date/Time   CALCIUM 9.8 10/14/2015 0848   CALCIUM 9.8 09/26/2015 0836   ALKPHOS 80 10/14/2015 0848   AST 17 10/14/2015 0848   ALT 24 10/14/2015 0848   BILITOT 0.52 10/14/2015 0848       No  results found for: LABCA2  No components found for: ULAGT364  No results for input(s): INR in the last 168 hours.  Urinalysis    Component Value Date/Time   LABSPEC 1.005 10/14/2015 0933   PHURINE 6.0 10/14/2015 0933   GLUCOSEU Negative 10/14/2015 0933   HGBUR Negative 10/14/2015 0933   BILIRUBINUR Negative 10/14/2015 0933   KETONESUR Negative 10/14/2015 0933   PROTEINUR Negative 10/14/2015 0933   UROBILINOGEN 0.2 10/14/2015 0933   NITRITE Negative 10/14/2015 0933   LEUKOCYTESUR Negative 10/14/2015 0933    STUDIES: Dg Chest Port 1 View  09/29/2015  CLINICAL DATA:  Port-A-Cath placement EXAM: PORTABLE CHEST 1 VIEW COMPARISON:  06/25/2008 FINDINGS: Left subclavian Port-A-Cath tip in the mid SVC.  No pneumothorax Lungs are clear without infiltrate or effusion. IMPRESSION: Port-A-Cath tip in the mid SVC. Electronically Signed   By: Franchot Gallo M.D.   On: 09/29/2015 14:36   Dg Fluoro Guide Cv Line-no Report  09/29/2015  CLINICAL DATA:  FLOURO GUIDE CV LINE Fluoroscopy was utilized by the requesting physician.  No radiographic interpretation.    ASSESSMENT: 55 y.o. status post right breast biopsy 08/19/2015 for a clinical T1b N0, stage IA invasive ductal carcinoma, estrogen and progesterone receptor positive, HER-2 not amplified, with an MIB-1 of 5%  (1) status post right lumpectomy and sentinel lymph node sampling 09/01/2015 for a pT1c pN0, stage IA invasive ductal carcinoma, grade 2, with negative margins  (2) Oncotype DX score of 23 falls in the intermediate range, and predicts a risk of recurrence outside the breast within 10 years of 15%, if the patient's only systemic therapy is tamoxifen for 5 years. It also predicts an absolute risk reduction of 4% with added chemotherapy  (3) cyclophosphamide and docetaxel x 4, given every 21 days with neulasta onpro to start 10/06/15  (4) adjuvant radiation to follow  (5) anti-estrogens to follow radiation  PLAN: Tracy Hart has no signs or  symptoms of infection.  I am having her urine tested to rule out UTI since this is easy to obtain. The labs were reviewed in detail and her WBC has dropped just slightly to 16.7 from 19.8. I consulted with Dr. Jana Hakim. He suggests proceeding with her 1st cycle of cyclophosphamide and docetaxel as planned today. We will hold off from using the neulast onpro this round.   Tracy Hart will return in 1 week for labs and a nadir visit. She understands and agrees with this plan. She knows the goal of treatment in her case is cure. She has been encouraged to call with any issues that might arise before her next visit here.   Laurie Panda, NP   10/14/2015 9:49 AM

## 2015-10-14 NOTE — Patient Instructions (Addendum)
[facility] Discharge Instructions for Patients Receiving Chemotherapy  Today you received the following chemotherapy agents: Taxotere and Cytoxan.  To help prevent nausea and vomiting after your treatment, we encourage you to take your nausea medication as directed. DO NOT take Zofran for the next 3 days.    If you develop nausea and vomiting that is not controlled by your nausea medication, call the clinic.   BELOW ARE SYMPTOMS THAT SHOULD BE REPORTED IMMEDIATELY:  *FEVER GREATER THAN 100.5 F  *CHILLS WITH OR WITHOUT FEVER  NAUSEA AND VOMITING THAT IS NOT CONTROLLED WITH YOUR NAUSEA MEDICATION  *UNUSUAL SHORTNESS OF BREATH  *UNUSUAL BRUISING OR BLEEDING  TENDERNESS IN MOUTH AND THROAT WITH OR WITHOUT PRESENCE OF ULCERS  *URINARY PROBLEMS  *BOWEL PROBLEMS  UNUSUAL RASH Items with * indicate a potential emergency and should be followed up as soon as possible.  Feel free to call the clinic you have any questions or concerns. The clinic phone number is [phone].  Please show the [facility] at check-in to the Emergency Department and triage nurse.    Docetaxel injection What is this medicine? DOCETAXEL (doe se TAX el) is a chemotherapy drug. It targets fast dividing cells, like cancer cells, and causes these cells to die. This medicine is used to treat many types of cancers like breast cancer, certain stomach cancers, head and neck cancer, lung cancer, and prostate cancer. This medicine may be used for other purposes; ask your health care provider or pharmacist if you have questions. What should I tell my health care provider before I take this medicine? They need to know if you have any of these conditions: -infection (especially a virus infection such as chickenpox, cold sores, or herpes) -liver disease -low blood counts, like low white cell, platelet, or red cell counts -an unusual or allergic reaction to docetaxel, polysorbate 80, other  chemotherapy agents, other medicines, foods, dyes, or preservatives -pregnant or trying to get pregnant -breast-feeding How should I use this medicine? This drug is given as an infusion into a vein. It is administered in a hospital or clinic by a specially trained health care professional. Talk to your pediatrician regarding the use of this medicine in children. Special care may be needed. Overdosage: If you think you have taken too much of this medicine contact a poison control center or emergency room at once. NOTE: This medicine is only for you. Do not share this medicine with others. What if I miss a dose? It is important not to miss your dose. Call your doctor or health care professional if you are unable to keep an appointment. What may interact with this medicine? -cyclosporine -erythromycin -ketoconazole -medicines to increase blood counts like filgrastim, pegfilgrastim, sargramostim -vaccines Talk to your doctor or health care professional before taking any of these medicines: -acetaminophen -aspirin -ibuprofen -ketoprofen -naproxen This list may not describe all possible interactions. Give your health care provider a list of all the medicines, herbs, non-prescription drugs, or dietary supplements you use. Also tell them if you smoke, drink alcohol, or use illegal drugs. Some items may interact with your medicine. What should I watch for while using this medicine? Your condition will be monitored carefully while you are receiving this medicine. You will need important blood work done while you are taking this medicine. This drug may make you feel generally unwell. This is not uncommon, as chemotherapy can affect healthy cells as well as cancer cells. Report any side effects. Continue your course  of treatment even though you feel ill unless your doctor tells you to stop. In some cases, you may be given additional medicines to help with side effects. Follow all directions for their  use. Call your doctor or health care professional for advice if you get a fever, chills or sore throat, or other symptoms of a cold or flu. Do not treat yourself. This drug decreases your body's ability to fight infections. Try to avoid being around people who are sick. This medicine may increase your risk to bruise or bleed. Call your doctor or health care professional if you notice any unusual bleeding. This medicine may contain alcohol in the product. You may get drowsy or dizzy. Do not drive, use machinery, or do anything that needs mental alertness until you know how this medicine affects you. Do not stand or sit up quickly, especially if you are an older patient. This reduces the risk of dizzy or fainting spells. Avoid alcoholic drinks. Do not become pregnant while taking this medicine. Women should inform their doctor if they wish to become pregnant or think they might be pregnant. There is a potential for serious side effects to an unborn child. Talk to your health care professional or pharmacist for more information. Do not breast-feed an infant while taking this medicine. What side effects may I notice from receiving this medicine? Side effects that you should report to your doctor or health care professional as soon as possible: -allergic reactions like skin rash, itching or hives, swelling of the face, lips, or tongue -low blood counts - This drug may decrease the number of white blood cells, red blood cells and platelets. You may be at increased risk for infections and bleeding. -signs of infection - fever or chills, cough, sore throat, pain or difficulty passing urine -signs of decreased platelets or bleeding - bruising, pinpoint red spots on the skin, black, tarry stools, nosebleeds -signs of decreased red blood cells - unusually weak or tired, fainting spells, lightheadedness -breathing problems -fast or irregular heartbeat -low blood pressure -mouth sores -nausea and vomiting -pain,  swelling, redness or irritation at the injection site -pain, tingling, numbness in the hands or feet -swelling of the ankle, feet, hands -weight gain Side effects that usually do not require medical attention (report to your prescriber or health care professional if they continue or are bothersome): -bone pain -complete hair loss including hair on your head, underarms, pubic hair, eyebrows, and eyelashes -diarrhea -excessive tearing -changes in the color of fingernails -loosening of the fingernails -nausea -muscle pain -red flush to skin -sweating -weak or tired This list may not describe all possible side effects. Call your doctor for medical advice about side effects. You may report side effects to FDA at 1-800-FDA-1088. Where should I keep my medicine? This drug is given in a hospital or clinic and will not be stored at home. NOTE: This sheet is a summary. It may not cover all possible information. If you have questions about this medicine, talk to your doctor, pharmacist, or health care provider.    2016, [employer]. (2014-08-26 16:04:57)   Cyclophosphamide injection What is this medicine? CYCLOPHOSPHAMIDE (sye kloe FOSS fa mide) is a chemotherapy drug. It slows the growth of cancer cells. This medicine is used to treat many types of cancer like lymphoma, myeloma, leukemia, breast cancer, and ovarian cancer, to name a few. This medicine may be used for other purposes; ask your health care provider or pharmacist if you have questions. What should I  tell my health care provider before I take this medicine? They need to know if you have any of these conditions: -blood disorders -history of other chemotherapy -infection -kidney disease -liver disease -recent or ongoing radiation therapy -tumors in the bone marrow -an unusual or allergic reaction to cyclophosphamide, other chemotherapy, other medicines, foods, dyes, or preservatives -pregnant or trying to get  pregnant -breast-feeding How should I use this medicine? This drug is usually given as an injection into a vein or muscle or by infusion into a vein. It is administered in a hospital or clinic by a specially trained health care professional. Talk to your pediatrician regarding the use of this medicine in children. Special care may be needed. Overdosage: If you think you have taken too much of this medicine contact a poison control center or emergency room at once. NOTE: This medicine is only for you. Do not share this medicine with others. What if I miss a dose? It is important not to miss your dose. Call your doctor or health care professional if you are unable to keep an appointment. What may interact with this medicine? This medicine may interact with the following medications: -amiodarone -amphotericin B -azathioprine -certain antiviral medicines for HIV or AIDS such as protease inhibitors (e.g., indinavir, ritonavir) and zidovudine -certain blood pressure medications such as benazepril, captopril, enalapril, fosinopril, lisinopril, moexipril, monopril, perindopril, quinapril, ramipril, trandolapril -certain cancer medications such as anthracyclines (e.g., daunorubicin, doxorubicin), busulfan, cytarabine, paclitaxel, pentostatin, tamoxifen, trastuzumab -certain diuretics such as chlorothiazide, chlorthalidone, hydrochlorothiazide, indapamide, metolazone -certain medicines that treat or prevent blood clots like warfarin -certain muscle relaxants such as succinylcholine -cyclosporine -etanercept -indomethacin -medicines to increase blood counts like filgrastim, pegfilgrastim, sargramostim -medicines used as general anesthesia -metronidazole -natalizumab This list may not describe all possible interactions. Give your health care provider a list of all the medicines, herbs, non-prescription drugs, or dietary supplements you use. Also tell them if you smoke, drink alcohol, or use illegal  drugs. Some items may interact with your medicine. What should I watch for while using this medicine? Visit your doctor for checks on your progress. This drug may make you feel generally unwell. This is not uncommon, as chemotherapy can affect healthy cells as well as cancer cells. Report any side effects. Continue your course of treatment even though you feel ill unless your doctor tells you to stop. Drink water or other fluids as directed. Urinate often, even at night. In some cases, you may be given additional medicines to help with side effects. Follow all directions for their use. Call your doctor or health care professional for advice if you get a fever, chills or sore throat, or other symptoms of a cold or flu. Do not treat yourself. This drug decreases your body's ability to fight infections. Try to avoid being around people who are sick. This medicine may increase your risk to bruise or bleed. Call your doctor or health care professional if you notice any unusual bleeding. Be careful brushing and flossing your teeth or using a toothpick because you may get an infection or bleed more easily. If you have any dental work done, tell your dentist you are receiving this medicine. You may get drowsy or dizzy. Do not drive, use machinery, or do anything that needs mental alertness until you know how this medicine affects you. Do not become pregnant while taking this medicine or for 1 year after stopping it. Women should inform their doctor if they wish to become pregnant or think they might  be pregnant. Men should not father a child while taking this medicine and for 4 months after stopping it. There is a potential for serious side effects to an unborn child. Talk to your health care professional or pharmacist for more information. Do not breast-feed an infant while taking this medicine. This medicine may interfere with the ability to have a child. This medicine has caused ovarian failure in some women.  This medicine has caused reduced sperm counts in some men. You should talk with your doctor or health care professional if you are concerned about your fertility. If you are going to have surgery, tell your doctor or health care professional that you have taken this medicine. What side effects may I notice from receiving this medicine? Side effects that you should report to your doctor or health care professional as soon as possible: -allergic reactions like skin rash, itching or hives, swelling of the face, lips, or tongue -low blood counts - this medicine may decrease the number of white blood cells, red blood cells and platelets. You may be at increased risk for infections and bleeding. -signs of infection - fever or chills, cough, sore throat, pain or difficulty passing urine -signs of decreased platelets or bleeding - bruising, pinpoint red spots on the skin, black, tarry stools, blood in the urine -signs of decreased red blood cells - unusually weak or tired, fainting spells, lightheadedness -breathing problems -dark urine -dizziness -palpitations -swelling of the ankles, feet, hands -trouble passing urine or change in the amount of urine -weight gain -yellowing of the eyes or skin Side effects that usually do not require medical attention (report to your doctor or health care professional if they continue or are bothersome): -changes in nail or skin color -hair loss -missed menstrual periods -mouth sores -nausea, vomiting This list may not describe all possible side effects. Call your doctor for medical advice about side effects. You may report side effects to FDA at 1-800-FDA-1088. Where should I keep my medicine? This drug is given in a hospital or clinic and will not be stored at home. NOTE: This sheet is a summary. It may not cover all possible information. If you have questions about this medicine, talk to your doctor, pharmacist, or health care provider.    2016,  [employer]. (2012-06-23 16:22:58)

## 2015-10-15 LAB — URINE CULTURE: Organism ID, Bacteria: NO GROWTH

## 2015-10-17 ENCOUNTER — Telehealth: Payer: Self-pay | Admitting: Nurse Practitioner

## 2015-10-17 NOTE — Telephone Encounter (Signed)
Left message for patient to inform her of r/s appts per [facility] 2/21 pof. Dates/times left inmessage

## 2015-10-21 ENCOUNTER — Telehealth: Payer: Self-pay

## 2015-10-21 ENCOUNTER — Other Ambulatory Visit (HOSPITAL_BASED_OUTPATIENT_CLINIC_OR_DEPARTMENT_OTHER): Payer: BLUE CROSS/BLUE SHIELD

## 2015-10-21 ENCOUNTER — Other Ambulatory Visit: Payer: BLUE CROSS/BLUE SHIELD

## 2015-10-21 ENCOUNTER — Ambulatory Visit: Payer: BLUE CROSS/BLUE SHIELD

## 2015-10-21 ENCOUNTER — Ambulatory Visit (HOSPITAL_COMMUNITY)
Admission: RE | Admit: 2015-10-21 | Discharge: 2015-10-21 | Disposition: A | Discharge status: Home / Self Care | Payer: BLUE CROSS/BLUE SHIELD | Source: Ambulatory Visit | Attending: Nurse Practitioner | Admitting: Nurse Practitioner

## 2015-10-21 ENCOUNTER — Encounter: Payer: Self-pay | Admitting: Nurse Practitioner

## 2015-10-21 ENCOUNTER — Ambulatory Visit (HOSPITAL_BASED_OUTPATIENT_CLINIC_OR_DEPARTMENT_OTHER): Payer: BLUE CROSS/BLUE SHIELD | Admitting: Nurse Practitioner

## 2015-10-21 ENCOUNTER — Encounter: Payer: Self-pay | Admitting: *Deleted

## 2015-10-21 VITALS — BP 101/54 | HR 96 | Temp 98.2°F | Resp 18 | Ht 68.0 in | Wt 210.6 lb

## 2015-10-21 DIAGNOSIS — D701 Agranulocytosis secondary to cancer chemotherapy: Secondary | ICD-10-CM

## 2015-10-21 DIAGNOSIS — J111 Influenza due to unidentified influenza virus with other respiratory manifestations: Secondary | ICD-10-CM

## 2015-10-21 DIAGNOSIS — C50411 Malignant neoplasm of upper-outer quadrant of right female breast: Secondary | ICD-10-CM | POA: Diagnosis not present

## 2015-10-21 DIAGNOSIS — R918 Other nonspecific abnormal finding of lung field: Secondary | ICD-10-CM | POA: Insufficient documentation

## 2015-10-21 DIAGNOSIS — R05 Cough: Secondary | ICD-10-CM | POA: Insufficient documentation

## 2015-10-21 DIAGNOSIS — R509 Fever, unspecified: Secondary | ICD-10-CM | POA: Diagnosis not present

## 2015-10-21 DIAGNOSIS — R059 Cough, unspecified: Secondary | ICD-10-CM

## 2015-10-21 DIAGNOSIS — T451X5A Adverse effect of antineoplastic and immunosuppressive drugs, initial encounter: Secondary | ICD-10-CM

## 2015-10-21 LAB — CBC WITH DIFFERENTIAL/PLATELET
BASO%: 1.2 % (ref 0.0–2.0)
Basophils Absolute: 0 10*3/uL (ref 0.0–0.1)
EOS ABS: 0 10*3/uL (ref 0.0–0.5)
EOS%: 2.4 % (ref 0.0–7.0)
HCT: 33.8 % — ABNORMAL LOW (ref 34.8–46.6)
HGB: 11.8 g/dL (ref 11.6–15.9)
LYMPH%: 80.5 % — AB (ref 14.0–49.7)
MCH: 34.1 pg — AB (ref 25.1–34.0)
MCHC: 34.9 g/dL (ref 31.5–36.0)
MCV: 97.7 fL (ref 79.5–101.0)
MONO#: 0.1 10*3/uL (ref 0.1–0.9)
MONO%: 11 % (ref 0.0–14.0)
NEUT%: 4.9 % — ABNORMAL LOW (ref 38.4–76.8)
NEUTROS ABS: 0 10*3/uL — AB (ref 1.5–6.5)
Platelets: 211 10*3/uL (ref 145–400)
RBC: 3.46 10*6/uL — ABNORMAL LOW (ref 3.70–5.45)
RDW: 11.8 % (ref 11.2–14.5)
WBC: 0.8 10*3/uL — AB (ref 3.9–10.3)
lymph#: 0.7 10*3/uL — ABNORMAL LOW (ref 0.9–3.3)

## 2015-10-21 LAB — INFLUENZA A AND B
Influenza A Ag, EIA: NEGATIVE
Influenza B Ag, EIA: NEGATIVE

## 2015-10-21 LAB — COMPREHENSIVE METABOLIC PANEL
ALT: 25 U/L (ref 0–55)
AST: 18 U/L (ref 5–34)
Albumin: 3.3 g/dL — ABNORMAL LOW (ref 3.5–5.0)
Alkaline Phosphatase: 59 U/L (ref 40–150)
Anion Gap: 7 mEq/L (ref 3–11)
BUN: 15 mg/dL (ref 7.0–26.0)
CO2: 26 meq/L (ref 22–29)
Calcium: 8.8 mg/dL (ref 8.4–10.4)
Chloride: 101 mEq/L (ref 98–109)
Creatinine: 0.9 mg/dL (ref 0.6–1.1)
EGFR: 73 mL/min/{1.73_m2} — AB (ref >90)
GLUCOSE: 129 mg/dL (ref 70–140)
POTASSIUM: 3.9 meq/L (ref 3.5–5.1)
SODIUM: 133 meq/L — AB (ref 136–145)
TOTAL PROTEIN: 6.4 g/dL (ref 6.4–8.3)
Total Bilirubin: 0.57 mg/dL (ref 0.20–1.20)

## 2015-10-21 MED ORDER — CIPROFLOXACIN HCL 500 MG PO TABS: 500.0000 mg | ORAL_TABLET | Freq: Two times a day (BID) | ORAL | Status: DC

## 2015-10-21 MED ORDER — AMOXICILLIN-POT CLAVULANATE 875-125 MG PO TABS: 1.0000 | ORAL_TABLET | Freq: Two times a day (BID) | ORAL | Status: DC

## 2015-10-21 NOTE — Progress Notes (Signed)
[place]  [phone] [phone]     ID: Tracy Hart DOB: 1961-08-02  MR#: [id number]  KPT#:[id number]  Patient Care Team: Tracy Solian, MD as PCP - General (Internal Medicine) Tracy Hart Tracy Cristal, MD as Consulting Physician (Obstetrics and Gynecology) Tracy Cruel, MD as Consulting Physician (Oncology) Tracy Keens, MD as Consulting Physician (General Surgery) Tracy Rudd, MD as Consulting Physician (Radiation Oncology) PCP: Tracy Ringer, MD OTHER MD:  CHIEF COMPLAINT: Estrogen receptor positive breast cancer  CURRENT TREATMENT: Adjuvant treatment pending   BREAST CANCER HISTORY: From the original intake note:  "Tracy Hart" had routine screening mammography at Dr. [name] office late November, showing a possible mass in the right breast. On 08/04/2015 she underwent right diagnostic mammography with tomosynthesis and right breast ultrasonography at the breast Center. The breast density was category C. In the upper outer quadrant of the right breast there was a 0.8 cm area of asymmetry which was not palpable by exam. Ultrasound showed no suspicious mass or calcifications.  Biopsy of the area of asymmetry was performed 08/19/2015, and showed ([id number]) invasive and in situ ductal carcinoma, E-cadherin positive, estrogen receptor 90% positive, progesterone receptor 90% positive, both with strong staining intensity, with an MIB-1 of 5%, and no HER-2 amplification, the signals ratio being 1.44 and the number per cell 1.80.  The patient was then referred to surgery and after appropriate discussion underwent right lumpectomy and sentinel lymph node sampling 09/01/2015. The pathology from this procedure ([id number]) confirmed an invasive ductal carcinoma, grade 2, measuring 1.7 cm, with some low-grade ductal carcinoma in situ. Margins were negative. All 5 sentinel lymph nodes were clear.  The patient's subsequent history is as detailed  below.  INTERVAL HISTORY: Tracy Hart returns today for follow-up of her estrogen receptor positive breast cancer, alone. Today is day 8, cycle 1 of cyclophosphamide and docetaxel. She had a fever of 100.28F yesterday, but failed to call and notify us, despite being alerted to do so for any fever prior to the start of chemotherapy. She is having a mild cough with no sputum, as well as some clear sinus drainage. She has been achy for 3 days and is taking ibuprofen PRN.  REVIEW OF SYSTEMS: Other than the above, Tracy Hart had a good first round of treatment. For the first 3 days she had barely any symptoms. She denies nausea or vomiting. Her appetite was pretty good. She denies mouth sores or rashes. She denies shortness of breath, chest pain, cough, or palpitations. A detailed review of systems is completely negative.   PAST MEDICAL HISTORY: Past Medical History  Diagnosis Date  . Cancer [facility]) 2016    right breast  . Breast cancer ([place]) 08/19/15    right breast  . Anxiety     takes Xanax daily as needed  . Hypertension     takes Benicar daily  . Hypothyroidism     takes Synthroid daily  . Depression     takes Lexapro daily  . Seasonal allergies     takes Zyrtec and uses Flonase daily  . Hyperlipidemia     takes Atorvastatin daily  . History of bronchitis 2000  . History of hiatal hernia   . GERD (gastroesophageal reflux disease)   . Insomnia     takes Trazodone nightly    PAST SURGICAL HISTORY: Past Surgical History  Procedure Laterality Date  . Breast lumpectomy with radioactive seed and sentinel lymph node biopsy Right 09/01/2015    Procedure: RIGHT BREAST LUMPECTOMY WITH RADIOACTIVE  SEED LOCALIZED AND SENTINEL LYMPH NODE BIOPSY;  Surgeon: Tracy Keens, MD;  Location: [facility];  Service: General;  Laterality: Right;  . Tonsillectomy  at age 17  . Hernia repair Right 2009  . Esophagogastroduodenoscopy    . Colonoscopy    . Portacath placement Left 09/29/2015     Procedure: INSERTION PORT-A-CATH;  Surgeon: Tracy Keens, MD;  Location: [facility] OR;  Service: General;  Laterality: Left;    FAMILY HISTORY Family History  Problem Relation Age of Onset  . Cancer Mother 6    breast  . Diabetes Mother   . Hypertension Mother   . Hypertension Father   . Cancer Sister 105    thyroid  . Seizures Sister    The patient's parents are in their early 62s as of January 2017. The patient had no brothers, 3 sisters. The patient's mother was diagnosed with breast cancer at age 76. The patient's sister was diagnosed with thyroid cancer at age 31. There is no history of ovarian cancer in the family.   GYNECOLOGIC HISTORY:  No LMP recorded. Patient is postmenopausal. Menarche age 67, the patient is GX P0. She was on oral contraceptives between 1996 and 2016, and at the time of her diagnosis had been on hormone replacement for 2 months.   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 Her husband Tracy Hart") works in Occupational hygienist for a Technical sales engineer. At home is just the 2 of them, with no pets. The patient at tends [place]    ADVANCED DIRECTIVES: In place. The patient's husband is her healthcare power of attorney with her sister Tracy Hart listed as secondary   HEALTH MAINTENANCE: Social History  Substance Use Topics  . Smoking status: Former Research scientist (life sciences)  . Smokeless tobacco: Not on file     Comment: quit smoking around 1990  . Alcohol Use: No     Colonoscopy: 2010/[facility]  PAP: November 2016  Bone density: Due/ at Dr [name]  Lipid panel:  Allergies  Allergen Reactions  . Codeine     No percocet, makes patient weird  . Sulfa Antibiotics Other (See Comments)    Caused mouth sores    Current Outpatient Prescriptions  Medication Sig Dispense Refill  . ALPRAZolam (XANAX) 0.5 MG tablet Take 0.25 mg by mouth at bedtime as needed for anxiety.     Marland Kitchen aspirin 81 MG tablet Take 81 mg by mouth  daily. Reported on 10/03/2015    . atorvastatin (LIPITOR) 40 MG tablet Take 40 mg by mouth daily.    . calcium carbonate (OS-CAL) 600 MG TABS tablet Take 600 mg by mouth 2 (two) times daily with a meal. Reported on 10/03/2015    . cetirizine (ZYRTEC) 10 MG tablet Take 10 mg by mouth daily.    . cholecalciferol (VITAMIN D) 400 UNITS TABS tablet Take 800 Units by mouth 2 (two) times daily.    Marland Kitchen dexamethasone (DECADRON) 4 MG tablet Take 2 tablets (8 mg total) by mouth 2 (two) times daily. Start the day before Taxotere. Then again the day after chemo for 3 days. 30 tablet 1  . escitalopram (LEXAPRO) 10 MG tablet Take 10 mg by mouth daily.    . fluticasone (FLONASE) 50 MCG/ACT nasal spray Place 1 spray into both nostrils daily. Reported on 10/14/2015    . levothyroxine (SYNTHROID, LEVOTHROID) 137 MCG tablet Take 137 mcg by mouth daily before breakfast.    . lidocaine-prilocaine (EMLA)  cream Apply 1 application topically as needed. 30 g 3  . olmesartan-hydrochlorothiazide (BENICAR HCT) 40-25 MG per tablet Take 1 tablet by mouth daily.    . Omega-3 Fatty Acids (FISH OIL) 1200 MG CAPS Take by mouth 2 (two) times daily. Reported on 10/03/2015    . traZODone (DESYREL) 50 MG tablet Take 50 mg by mouth at bedtime.    Marland Kitchen amoxicillin-clavulanate (AUGMENTIN) 875-125 MG tablet Take 1 tablet by mouth 2 (two) times daily. 10 tablet 0  . ciprofloxacin (CIPRO) 500 MG tablet Take 1 tablet (500 mg total) by mouth 2 (two) times daily. 10 tablet 0  . ondansetron (ZOFRAN) 8 MG tablet Take 1 tablet (8 mg total) by mouth 2 (two) times daily as needed for refractory nausea / vomiting. Start on day 3 after chemo. (Patient not taking: Reported on 10/14/2015) 30 tablet 1  . prochlorperazine (COMPAZINE) 10 MG tablet Take 1 tablet (10 mg total) by mouth every 6 (six) hours as needed (Nausea or vomiting). (Patient not taking: Reported on 10/14/2015) 30 tablet 1  . traMADol (ULTRAM) 50 MG tablet Take 1-2 tablets (50-100 mg total) by mouth  every 6 (six) hours as needed. (Patient not taking: Reported on 10/14/2015) 20 tablet 0   No current facility-administered medications for this visit.    OBJECTIVE: Middle-aged white woman in no acute distress Filed Vitals:   10/21/15 1032  BP: 101/54  Pulse: 96  Temp: 98.2 F (36.8 C)  Resp: 18     Body mass index is 32.03 kg/(m^2).    ECOG FS:1 - Symptomatic but completely ambulatory  Skin: warm, dry  HEENT: sclerae anicteric, conjunctivae pink, oropharynx clear. No thrush or mucositis.  Lymph Nodes: No cervical or supraclavicular lymphadenopathy  Lungs: clear to auscultation bilaterally, no rales, wheezes, or rhonci  Heart: regular rate and rhythm  Abdomen: round, soft, non tender, positive bowel sounds  Musculoskeletal: No focal spinal tenderness, no peripheral edema  Neuro: non focal, well oriented, positive affect  Breasts: deferred  LAB RESULTS:  CMP     Component Value Date/Time   NA 133* 10/21/2015 1022   NA 139 09/26/2015 0836   K 3.9 10/21/2015 1022   K 4.1 09/26/2015 0836   CL 105 09/26/2015 0836   CO2 26 10/21/2015 1022   CO2 28 09/26/2015 0836   GLUCOSE 129 10/21/2015 1022   GLUCOSE 109* 09/26/2015 0836   BUN 15.0 10/21/2015 1022   BUN 15 09/26/2015 0836   CREATININE 0.9 10/21/2015 1022   CREATININE 0.82 09/26/2015 0836   CALCIUM 8.8 10/21/2015 1022   CALCIUM 9.8 09/26/2015 0836   PROT 6.4 10/21/2015 1022   ALBUMIN 3.3* 10/21/2015 1022   AST 18 10/21/2015 1022   ALT 25 10/21/2015 1022   ALKPHOS 59 10/21/2015 1022   BILITOT 0.57 10/21/2015 1022   GFRNONAA >60 09/26/2015 0836   GFRAA >60 09/26/2015 0836    INo results found for: SPEP, UPEP  Lab Results  Component Value Date   WBC 0.8* 10/21/2015   NEUTROABS 0.0* 10/21/2015   HGB 11.8 10/21/2015   HCT 33.8* 10/21/2015   MCV 97.7 10/21/2015   PLT 211 10/21/2015      Chemistry      Component Value Date/Time   NA 133* 10/21/2015 1022   NA 139 09/26/2015 0836   K 3.9 10/21/2015 1022   K  4.1 09/26/2015 0836   CL 105 09/26/2015 0836   CO2 26 10/21/2015 1022   CO2 28 09/26/2015 0836   BUN 15.0 10/21/2015  1022   BUN 15 09/26/2015 0836   CREATININE 0.9 10/21/2015 1022   CREATININE 0.82 09/26/2015 0836      Component Value Date/Time   CALCIUM 8.8 10/21/2015 1022   CALCIUM 9.8 09/26/2015 0836   ALKPHOS 59 10/21/2015 1022   AST 18 10/21/2015 1022   ALT 25 10/21/2015 1022   BILITOT 0.57 10/21/2015 1022       No results found for: LABCA2  No components found for: LABCA125  No results for input(s): INR in the last 168 hours.  Urinalysis    Component Value Date/Time   LABSPEC 1.005 10/14/2015 0933   PHURINE 6.0 10/14/2015 0933   GLUCOSEU Negative 10/14/2015 0933   HGBUR Negative 10/14/2015 0933   BILIRUBINUR Negative 10/14/2015 0933   KETONESUR Negative 10/14/2015 0933   PROTEINUR Negative 10/14/2015 0933   UROBILINOGEN 0.2 10/14/2015 0933   NITRITE Negative 10/14/2015 0933   LEUKOCYTESUR Negative 10/14/2015 0933    STUDIES: Dg Chest 2 View  10/21/2015  CLINICAL DATA:  Cough and fever. EXAM: CHEST  2 VIEW COMPARISON:  None. FINDINGS: Prior port catheter noted with lead tip projected over the superior vena cava. Mediastinum and hilar structures are normal. Heart size normal. Mild bilateral from interstitial prominence noted consistent mild pneumonitis. No pleural effusion or pneumothorax. IMPRESSION: 1. Mild bilateral pulmonary interstitial prominence suggesting mild pneumonitis. 2. Power port catheter in stable position. Electronically Signed   By: Marcello Moores  Register   On: 10/21/2015 11:56   Dg Chest Port 1 View  09/29/2015  CLINICAL DATA:  Port-A-Cath placement EXAM: PORTABLE CHEST 1 VIEW COMPARISON:  06/25/2008 FINDINGS: Left subclavian Port-A-Cath tip in the mid SVC.  No pneumothorax Lungs are clear without infiltrate or effusion. IMPRESSION: Port-A-Cath tip in the mid SVC. Electronically Signed   By: Franchot Gallo M.D.   On: 09/29/2015 14:36   Dg Fluoro Guide  Cv Line-no Report  09/29/2015  CLINICAL DATA:  FLOURO GUIDE CV LINE Fluoroscopy was utilized by the requesting physician.  No radiographic interpretation.    ASSESSMENT: 55 y.o. status post right breast biopsy 08/19/2015 for a clinical T1b N0, stage IA invasive ductal carcinoma, estrogen and progesterone receptor positive, HER-2 not amplified, with an MIB-1 of 5%  (1) status post right lumpectomy and sentinel lymph node sampling 09/01/2015 for a pT1c pN0, stage IA invasive ductal carcinoma, grade 2, with negative margins  (2) Oncotype DX score of 23 falls in the intermediate range, and predicts a risk of recurrence outside the breast within 10 years of 15%, if the patient's only systemic therapy is tamoxifen for 5 years. It also predicts an absolute risk reduction of 4% with added chemotherapy  (3) cyclophosphamide and docetaxel x 4, given every 21 days with neulasta onpro to start 10/06/15  (4) adjuvant radiation to follow  (5) anti-estrogens to follow radiation  PLAN: The labs were reviewed in detail and Tracy Hart's ANC is down to 0.0. This combined with her fever and sinus symptoms with cough, led me to order a flu swab and a chest xray. The chest xray did not show any pneumonia and her flu swab was negative. We reviewed neutropenic precautions including alerting [place] of a fever 100.66F or greater, avoiding crowds and sick contacts, and good hand hygiene. She was given several masks to wear through the end of the week if she leaves the house. She plans to work from home this week anyway. I have sent in both cipro and augmentin per Dr. Virgie Dad suggestion for prophylactic antibiotic coverage. She will  increase her water intake as well.   Recall that we did not use neulasta with this first cycle of treatment given her leukocytosis. She will return in 1 week for a lab draw only to be sure that her [place] is trending upward in time for the next cycle of treatment. If not we will treat her with 2 days of  neupogen.  Tracy Hart will return in 2 weeks for cycle 2 of treatment. She understands and agrees with this plan. She knows the goal of treatment in her case is cure. She has been encouraged to call with any issues that might arise before her next visit here.   Laurie Panda, NP   10/21/2015 3:49 PM

## 2015-10-21 NOTE — Telephone Encounter (Signed)
Called and left a message with new appointments per pof  Marlyne Totaro °

## 2015-10-22 ENCOUNTER — Telehealth: Payer: Self-pay | Admitting: *Deleted

## 2015-10-22 NOTE — Telephone Encounter (Signed)
Called pt to f/u from yesterday's visit. Pt said she is beginning to feel much better. Mostly been resting today at home. Pt denies any fever. This morning temp was 99.8 and by lunch she was 98.0. This evening she has been 99.6 oral. I told pt to call me if her temp is >100.3, she agreed. I will also call her on Friday to follow up again as well. Message to be fwd to [employer].

## 2015-10-23 ENCOUNTER — Ambulatory Visit: Payer: BLUE CROSS/BLUE SHIELD

## 2015-10-24 ENCOUNTER — Telehealth: Payer: Self-pay | Admitting: *Deleted

## 2015-10-24 NOTE — Telephone Encounter (Signed)
Called pt to follow up. Pt states, " I'm feeling so much better today, more like myself ". Pt denies any fever in the past 24 hours. Her muscle pain is near gone. Pt said she really appreciates [place] staying on top of things and checking on her frequently. Pt will have repeat labs on next Tues 3/7. She will be called with the results. Message to be fwd to [employer].

## 2015-10-28 ENCOUNTER — Other Ambulatory Visit: Payer: Self-pay | Admitting: Nurse Practitioner

## 2015-10-28 ENCOUNTER — Ambulatory Visit: Payer: BLUE CROSS/BLUE SHIELD | Admitting: Nurse Practitioner

## 2015-10-28 ENCOUNTER — Other Ambulatory Visit: Payer: BLUE CROSS/BLUE SHIELD

## 2015-10-28 ENCOUNTER — Ambulatory Visit: Payer: BLUE CROSS/BLUE SHIELD

## 2015-10-28 ENCOUNTER — Other Ambulatory Visit (HOSPITAL_BASED_OUTPATIENT_CLINIC_OR_DEPARTMENT_OTHER): Payer: BLUE CROSS/BLUE SHIELD

## 2015-10-28 DIAGNOSIS — C50411 Malignant neoplasm of upper-outer quadrant of right female breast: Secondary | ICD-10-CM

## 2015-10-28 LAB — CBC WITH DIFFERENTIAL/PLATELET
BASO%: 1.1 % (ref 0.0–2.0)
Basophils Absolute: 0.1 10*3/uL (ref 0.0–0.1)
EOS%: 0.2 % (ref 0.0–7.0)
Eosinophils Absolute: 0 10*3/uL (ref 0.0–0.5)
HCT: 33.5 % — ABNORMAL LOW (ref 34.8–46.6)
HGB: 11.7 g/dL (ref 11.6–15.9)
LYMPH%: 33.6 % (ref 14.0–49.7)
MCH: 33.8 pg (ref 25.1–34.0)
MCHC: 34.8 g/dL (ref 31.5–36.0)
MCV: 97.3 fL (ref 79.5–101.0)
MONO#: 0.7 10*3/uL (ref 0.1–0.9)
MONO%: 9.1 % (ref 0.0–14.0)
NEUT%: 56 % (ref 38.4–76.8)
NEUTROS ABS: 4.5 10*3/uL (ref 1.5–6.5)
Platelets: 330 10*3/uL (ref 145–400)
RBC: 3.45 10*6/uL — AB (ref 3.70–5.45)
RDW: 12 % (ref 11.2–14.5)
WBC: 8.1 10*3/uL (ref 3.9–10.3)
lymph#: 2.7 10*3/uL (ref 0.9–3.3)

## 2015-10-28 LAB — COMPREHENSIVE METABOLIC PANEL
ALBUMIN: 3.5 g/dL (ref 3.5–5.0)
ALT: 44 U/L (ref 0–55)
AST: 30 U/L (ref 5–34)
Alkaline Phosphatase: 71 U/L (ref 40–150)
Anion Gap: 9 mEq/L (ref 3–11)
BILIRUBIN TOTAL: 0.22 mg/dL (ref 0.20–1.20)
BUN: 18.1 mg/dL (ref 7.0–26.0)
CO2: 27 meq/L (ref 22–29)
Calcium: 9.4 mg/dL (ref 8.4–10.4)
Chloride: 101 mEq/L (ref 98–109)
Creatinine: 0.9 mg/dL (ref 0.6–1.1)
EGFR: 76 mL/min/{1.73_m2} — ABNORMAL LOW (ref >90)
GLUCOSE: 84 mg/dL (ref 70–140)
Potassium: 3.9 mEq/L (ref 3.5–5.1)
SODIUM: 137 meq/L (ref 136–145)
TOTAL PROTEIN: 7 g/dL (ref 6.4–8.3)

## 2015-10-28 LAB — TECHNOLOGIST REVIEW

## 2015-11-04 ENCOUNTER — Ambulatory Visit: Payer: BLUE CROSS/BLUE SHIELD

## 2015-11-04 ENCOUNTER — Telehealth: Payer: Self-pay | Admitting: Nurse Practitioner

## 2015-11-04 ENCOUNTER — Ambulatory Visit (HOSPITAL_BASED_OUTPATIENT_CLINIC_OR_DEPARTMENT_OTHER): Payer: BLUE CROSS/BLUE SHIELD | Admitting: Nurse Practitioner

## 2015-11-04 ENCOUNTER — Ambulatory Visit (HOSPITAL_BASED_OUTPATIENT_CLINIC_OR_DEPARTMENT_OTHER): Payer: BLUE CROSS/BLUE SHIELD

## 2015-11-04 ENCOUNTER — Other Ambulatory Visit: Payer: BLUE CROSS/BLUE SHIELD

## 2015-11-04 ENCOUNTER — Other Ambulatory Visit (HOSPITAL_BASED_OUTPATIENT_CLINIC_OR_DEPARTMENT_OTHER): Payer: BLUE CROSS/BLUE SHIELD

## 2015-11-04 ENCOUNTER — Encounter: Payer: Self-pay | Admitting: Nurse Practitioner

## 2015-11-04 VITALS — BP 132/63 | HR 92 | Temp 98.0°F | Resp 18 | Ht 68.0 in | Wt 213.5 lb

## 2015-11-04 DIAGNOSIS — D72829 Elevated white blood cell count, unspecified: Secondary | ICD-10-CM

## 2015-11-04 DIAGNOSIS — C50411 Malignant neoplasm of upper-outer quadrant of right female breast: Secondary | ICD-10-CM | POA: Diagnosis not present

## 2015-11-04 DIAGNOSIS — Z5111 Encounter for antineoplastic chemotherapy: Secondary | ICD-10-CM | POA: Diagnosis not present

## 2015-11-04 DIAGNOSIS — Z5189 Encounter for other specified aftercare: Secondary | ICD-10-CM

## 2015-11-04 LAB — COMPREHENSIVE METABOLIC PANEL
ALBUMIN: 4 g/dL (ref 3.5–5.0)
ALK PHOS: 62 U/L (ref 40–150)
ALT: 49 U/L (ref 0–55)
ANION GAP: 10 meq/L (ref 3–11)
AST: 23 U/L (ref 5–34)
BILIRUBIN TOTAL: 0.41 mg/dL (ref 0.20–1.20)
BUN: 20 mg/dL (ref 7.0–26.0)
CO2: 25 meq/L (ref 22–29)
CREATININE: 1 mg/dL (ref 0.6–1.1)
Calcium: 10.1 mg/dL (ref 8.4–10.4)
Chloride: 99 mEq/L (ref 98–109)
EGFR: 63 mL/min/{1.73_m2} — ABNORMAL LOW (ref >90)
GLUCOSE: 142 mg/dL — AB (ref 70–140)
Potassium: 4.5 mEq/L (ref 3.5–5.1)
SODIUM: 134 meq/L — AB (ref 136–145)
TOTAL PROTEIN: 7.2 g/dL (ref 6.4–8.3)

## 2015-11-04 LAB — CBC WITH DIFFERENTIAL/PLATELET
BASO%: 0.2 % (ref 0.0–2.0)
Basophils Absolute: 0 10*3/uL (ref 0.0–0.1)
EOS ABS: 0 10*3/uL (ref 0.0–0.5)
EOS%: 0.1 % (ref 0.0–7.0)
HCT: 33.1 % — ABNORMAL LOW (ref 34.8–46.6)
HEMOGLOBIN: 11.2 g/dL — AB (ref 11.6–15.9)
LYMPH%: 10.2 % — AB (ref 14.0–49.7)
MCH: 33.3 pg (ref 25.1–34.0)
MCHC: 33.9 g/dL (ref 31.5–36.0)
MCV: 98.1 fL (ref 79.5–101.0)
MONO#: 0.8 10*3/uL (ref 0.1–0.9)
MONO%: 4.1 % (ref 0.0–14.0)
NEUT%: 85.4 % — ABNORMAL HIGH (ref 38.4–76.8)
NEUTROS ABS: 16.1 10*3/uL — AB (ref 1.5–6.5)
Platelets: 346 10*3/uL (ref 145–400)
RBC: 3.37 10*6/uL — AB (ref 3.70–5.45)
RDW: 12.5 % (ref 11.2–14.5)
WBC: 18.9 10*3/uL — AB (ref 3.9–10.3)
lymph#: 1.9 10*3/uL (ref 0.9–3.3)

## 2015-11-04 MED ORDER — DOCETAXEL CHEMO INJECTION 160 MG/16ML
75.0000 mg/m2 | Freq: Once | INTRAVENOUS | Status: AC
  Administered 2015-11-04: 160 mg via INTRAVENOUS
  Filled 2015-11-04: qty 16

## 2015-11-04 MED ORDER — SODIUM CHLORIDE 0.9 % IV SOLN
600.0000 mg/m2 | Freq: Once | INTRAVENOUS | Status: AC
  Administered 2015-11-04: 1300 mg via INTRAVENOUS
  Filled 2015-11-04: qty 65

## 2015-11-04 MED ORDER — LORAZEPAM 0.5 MG PO TABS: 0.5000 mg | ORAL_TABLET | Freq: Every day | ORAL | Status: DC

## 2015-11-04 MED ORDER — PALONOSETRON HCL INJECTION 0.25 MG/5ML
INTRAVENOUS | Status: AC
  Filled 2015-11-04: qty 5

## 2015-11-04 MED ORDER — HEPARIN SOD (PORK) LOCK FLUSH 100 UNIT/ML IV SOLN
500.0000 [IU] | Freq: Once | INTRAVENOUS | Status: AC | PRN
  Administered 2015-11-04: 500 [IU]
  Filled 2015-11-04: qty 5

## 2015-11-04 MED ORDER — PALONOSETRON HCL INJECTION 0.25 MG/5ML
0.2500 mg | Freq: Once | INTRAVENOUS | Status: AC
  Administered 2015-11-04: 0.25 mg via INTRAVENOUS

## 2015-11-04 MED ORDER — SODIUM CHLORIDE 0.9 % IV SOLN
Freq: Once | INTRAVENOUS | Status: AC
  Administered 2015-11-04: 15:00:00 via INTRAVENOUS

## 2015-11-04 MED ORDER — SODIUM CHLORIDE 0.9 % IV SOLN
10.0000 mg | Freq: Once | INTRAVENOUS | Status: AC
  Administered 2015-11-04: 10 mg via INTRAVENOUS
  Filled 2015-11-04: qty 1

## 2015-11-04 MED ORDER — PEGFILGRASTIM 6 MG/0.6ML ~~LOC~~ PSKT
6.0000 mg | PREFILLED_SYRINGE | Freq: Once | SUBCUTANEOUS | Status: AC
  Administered 2015-11-04: 6 mg via SUBCUTANEOUS
  Filled 2015-11-04: qty 0.6

## 2015-11-04 MED ORDER — SODIUM CHLORIDE 0.9% FLUSH
10.0000 mL | INTRAVENOUS | Status: DC | PRN
  Administered 2015-11-04: 10 mL
  Filled 2015-11-04: qty 10

## 2015-11-04 NOTE — Progress Notes (Signed)
Mariana Kaufman, RN spoke with Gentry Fitz, NP regarding order for Neulasta and current ANC.  Gentry Fitz, NP advised to proceed with Neulasta.

## 2015-11-04 NOTE — Telephone Encounter (Signed)
appt made per 3/14 pof. Pt will get updated sch in treatment room

## 2015-11-04 NOTE — Patient Instructions (Signed)
[facility] Discharge Instructions for Patients Receiving Chemotherapy  Today you received the following chemotherapy agents Taxotere and Cytoxan.  To help prevent nausea and vomiting after your treatment, we encourage you to take your nausea medication as prescribed.   If you develop nausea and vomiting that is not controlled by your nausea medication, call the clinic.   BELOW ARE SYMPTOMS THAT SHOULD BE REPORTED IMMEDIATELY:  *FEVER GREATER THAN 100.5 F  *CHILLS WITH OR WITHOUT FEVER  NAUSEA AND VOMITING THAT IS NOT CONTROLLED WITH YOUR NAUSEA MEDICATION  *UNUSUAL SHORTNESS OF BREATH  *UNUSUAL BRUISING OR BLEEDING  TENDERNESS IN MOUTH AND THROAT WITH OR WITHOUT PRESENCE OF ULCERS  *URINARY PROBLEMS  *BOWEL PROBLEMS  UNUSUAL RASH Items with * indicate a potential emergency and should be followed up as soon as possible.  Feel free to call the clinic you have any questions or concerns. The clinic phone number is [phone].  Please show the CHEMO ALERT CARD at check-in to the Emergency Department and triage nurse.   

## 2015-11-04 NOTE — Progress Notes (Signed)
[facility]  [phone]) [phone] Fax:([phone]) [phone]     ID: Tracy Hart DOB: 04-25-1961  MR#: [id number]  FRT#:[id number]  Patient Care Team: Prince Solian, MD as PCP - General (Internal Medicine) [name] [name], MD as Consulting Physician (Obstetrics and Gynecology) [name], MD as Consulting Physician (Oncology) [name], MD as Consulting Physician (General Surgery) [name], MD as Consulting Physician (Radiation Oncology) PCP: [name], MD OTHER MD:  CHIEF COMPLAINT: Estrogen receptor positive breast cancer  CURRENT TREATMENT: Adjuvant treatment pending   BREAST CANCER HISTORY: From the original intake note:  "Tracy Hart" had routine screening mammography at Dr. [name] office late November, showing a possible mass in the right breast. On 08/04/2015 she underwent right diagnostic mammography with tomosynthesis and right breast ultrasonography at the breast Center. The breast density was category C. In the upper outer quadrant of the right breast there was a 0.8 cm area of asymmetry which was not palpable by exam. Ultrasound showed no suspicious mass or calcifications.  Biopsy of the area of asymmetry was performed 08/19/2015, and showed ([id number]) invasive and in situ ductal carcinoma, E-cadherin positive, estrogen receptor 90% positive, progesterone receptor 90% positive, both with strong staining intensity, with an MIB-1 of 5%, and no HER-2 amplification, the signals ratio being 1.44 and the number per cell 1.80.  The patient was then referred to surgery and after appropriate discussion underwent right lumpectomy and sentinel lymph node sampling 09/01/2015. The pathology from this procedure ([id number]) confirmed an invasive ductal carcinoma, grade 2, measuring 1.7 cm, with some low-grade ductal carcinoma in situ. Margins were negative. All 5 sentinel lymph nodes were clear.  The patient's subsequent history is as detailed  below.  INTERVAL HISTORY: Tracy Hart returns today for follow-up of her estrogen receptor positive breast cancer, accompanied by a friend. Today is day 1, cycle 2 of cyclophosphamide and docetaxel. Neulasta was omitted from the first cycle due to a high WBC prior to the start of chemotherapy, but she returned with a neutropenic fever the following week and was treated with antibiotics as an outpatient. She has finished these drugs and no longer has any fevers or chills. She has some clear nasal drainage, but denies any signs or symptoms of infection.   REVIEW OF SYSTEMS: Tracy Hart had some mild nausea this past week, and she took her compazine as directed. She is moving her bowels well. Her appetite dropped and she didn't keep up with her fluids for a few days, but this is back to normal. She is in no pain. The body aches she had during the fever resolved with ibuprofen use. She denies mouth sores, rashes, or neuropathy symptoms. She is sleeping well. A detailed review of systems is otherwise stable.  PAST MEDICAL HISTORY: Past Medical History  Diagnosis Date  . Cancer [facility]) 2016    right breast  . Breast cancer ([place]) 08/19/15    right breast  . Anxiety     takes Xanax daily as needed  . Hypertension     takes Benicar daily  . Hypothyroidism     takes Synthroid daily  . Depression     takes Lexapro daily  . Seasonal allergies     takes Zyrtec and uses Flonase daily  . Hyperlipidemia     takes Atorvastatin daily  . History of bronchitis 2000  . History of hiatal hernia   . GERD (gastroesophageal reflux disease)   . Insomnia     takes Trazodone nightly    PAST  SURGICAL HISTORY: Past Surgical History  Procedure Laterality Date  . Breast lumpectomy with radioactive seed and sentinel lymph node biopsy Right 09/01/2015    Procedure: RIGHT BREAST LUMPECTOMY WITH RADIOACTIVE SEED LOCALIZED AND SENTINEL LYMPH NODE BIOPSY;  Surgeon: [name], MD;  Location: [facility];   Service: General;  Laterality: Right;  . Tonsillectomy  at age 18  . Hernia repair Right 2009  . Esophagogastroduodenoscopy    . Colonoscopy    . Portacath placement Left 09/29/2015    Procedure: INSERTION PORT-A-CATH;  Surgeon: [name], MD;  Location: [facility] OR;  Service: General;  Laterality: Left;    FAMILY HISTORY Family History  Problem Relation Age of Onset  . Cancer Mother 52    breast  . Diabetes Mother   . Hypertension Mother   . Hypertension Father   . Cancer Sister 14    thyroid  . Seizures Sister    The patient's parents are in their early 29s as of January 2017. The patient had no brothers, 3 sisters. The patient's mother was diagnosed with breast cancer at age 8. The patient's sister was diagnosed with thyroid cancer at age 62. There is no history of ovarian cancer in the family.   GYNECOLOGIC HISTORY:  No LMP recorded. Patient is postmenopausal. Menarche age 56, the patient is GX P0. She was on oral contraceptives between 1996 and 2016, and at the time of her diagnosis had been on hormone replacement for 2 months.   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 Her husband [name]") works in Occupational hygienist for a Technical sales engineer. At home is just the 2 of them, with no pets. The patient at tends [place]    ADVANCED DIRECTIVES: In place. The patient's husband is her healthcare power of attorney with her sister [name] listed as secondary   HEALTH MAINTENANCE: Social History  Substance Use Topics  . Smoking status: Former Research scientist (life sciences)  . Smokeless tobacco: Not on file     Comment: quit smoking around 1990  . Alcohol Use: No     Colonoscopy: 2010/[facility]  PAP: November 2016  Bone density: Due/ at Dr [name]  Lipid panel:  Allergies  Allergen Reactions  . Codeine     No percocet, makes patient weird  . Sulfa Antibiotics Other (See Comments)    Caused mouth sores    Current Outpatient  Prescriptions  Medication Sig Dispense Refill  . aspirin 81 MG tablet Take 81 mg by mouth daily. Reported on 10/03/2015    . atorvastatin (LIPITOR) 40 MG tablet Take 40 mg by mouth daily.    . calcium carbonate (OS-CAL) 600 MG TABS tablet Take 600 mg by mouth 2 (two) times daily with a meal. Reported on 10/03/2015    . cetirizine (ZYRTEC) 10 MG tablet Take 10 mg by mouth daily.    . cholecalciferol (VITAMIN D) 400 UNITS TABS tablet Take 800 Units by mouth 2 (two) times daily.    Marland Kitchen dexamethasone (DECADRON) 4 MG tablet Take 2 tablets (8 mg total) by mouth 2 (two) times daily. Start the day before Taxotere. Then again the day after chemo for 3 days. 30 tablet 1  . escitalopram (LEXAPRO) 10 MG tablet Take 10 mg by mouth daily.    Marland Kitchen levothyroxine (SYNTHROID, LEVOTHROID) 137 MCG tablet Take 137 mcg by mouth daily before breakfast.    . lidocaine-prilocaine (EMLA) cream Apply 1 application topically as needed. 30 g  3  . olmesartan-hydrochlorothiazide (BENICAR HCT) 40-25 MG per tablet Take 1 tablet by mouth daily.    . Omega-3 Fatty Acids (FISH OIL) 1200 MG CAPS Take by mouth 2 (two) times daily. Reported on 10/03/2015    . prochlorperazine (COMPAZINE) 10 MG tablet Take 1 tablet (10 mg total) by mouth every 6 (six) hours as needed (Nausea or vomiting). 30 tablet 1  . ALPRAZolam (XANAX) 0.5 MG tablet Take 0.25 mg by mouth at bedtime as needed for anxiety. Reported on 11/04/2015    . fluticasone (FLONASE) 50 MCG/ACT nasal spray Place 1 spray into both nostrils daily. Reported on 11/04/2015    . LORazepam (ATIVAN) 0.5 MG tablet Take 1 tablet (0.5 mg total) by mouth at bedtime. as needed( Nausea or vomiting) 30 tablet 0  . ondansetron (ZOFRAN) 8 MG tablet Take 1 tablet (8 mg total) by mouth 2 (two) times daily as needed for refractory nausea / vomiting. Start on day 3 after chemo. (Patient not taking: Reported on 10/14/2015) 30 tablet 1  . traMADol (ULTRAM) 50 MG tablet Take 1-2 tablets (50-100 mg total) by mouth  every 6 (six) hours as needed. (Patient not taking: Reported on 10/14/2015) 20 tablet 0  . traZODone (DESYREL) 50 MG tablet Take 50 mg by mouth at bedtime. Reported on 11/04/2015     No current facility-administered medications for this visit.    OBJECTIVE: Middle-aged white woman in no acute distress Filed Vitals:   11/04/15 1333  BP: 132/63  Pulse: 92  Temp: 98 F (36.7 C)  Resp: 18     Body mass index is 32.47 kg/(m^2).    ECOG FS:1 - Symptomatic but completely ambulatory  Sclerae unicteric, pupils round and equal Oropharynx clear and moist-- no thrush or other lesions No cervical or supraclavicular adenopathy Lungs no rales or rhonchi Heart regular rate and rhythm Abd soft, nontender, positive bowel sounds MSK no focal spinal tenderness, no upper extremity lymphedema Neuro: nonfocal, well oriented, appropriate affect Breasts: deferred  LAB RESULTS:  CMP     Component Value Date/Time   NA 134* 11/04/2015 1322   NA 139 09/26/2015 0836   K 4.5 11/04/2015 1322   K 4.1 09/26/2015 0836   CL 105 09/26/2015 0836   CO2 25 11/04/2015 1322   CO2 28 09/26/2015 0836   GLUCOSE 142* 11/04/2015 1322   GLUCOSE 109* 09/26/2015 0836   BUN 20.0 11/04/2015 1322   BUN 15 09/26/2015 0836   CREATININE 1.0 11/04/2015 1322   CREATININE 0.82 09/26/2015 0836   CALCIUM 10.1 11/04/2015 1322   CALCIUM 9.8 09/26/2015 0836   PROT 7.2 11/04/2015 1322   ALBUMIN 4.0 11/04/2015 1322   AST 23 11/04/2015 1322   ALT 49 11/04/2015 1322   ALKPHOS 62 11/04/2015 1322   BILITOT 0.41 11/04/2015 1322   GFRNONAA >60 09/26/2015 0836   GFRAA >60 09/26/2015 0836    INo results found for: SPEP, UPEP  Lab Results  Component Value Date   WBC 18.9* 11/04/2015   NEUTROABS 16.1* 11/04/2015   HGB 11.2* 11/04/2015   HCT 33.1* 11/04/2015   MCV 98.1 11/04/2015   PLT 346 11/04/2015      Chemistry      Component Value Date/Time   NA 134* 11/04/2015 1322   NA 139 09/26/2015 0836   K 4.5 11/04/2015 1322    K 4.1 09/26/2015 0836   CL 105 09/26/2015 0836   CO2 25 11/04/2015 1322   CO2 28 09/26/2015 0836   BUN 20.0 11/04/2015 1322  BUN 15 09/26/2015 0836   CREATININE 1.0 11/04/2015 1322   CREATININE 0.82 09/26/2015 0836      Component Value Date/Time   CALCIUM 10.1 11/04/2015 1322   CALCIUM 9.8 09/26/2015 0836   ALKPHOS 62 11/04/2015 1322   AST 23 11/04/2015 1322   ALT 49 11/04/2015 1322   BILITOT 0.41 11/04/2015 1322       No results found for: LABCA2  No components found for: LABCA125  No results for input(s): INR in the last 168 hours.  Urinalysis    Component Value Date/Time   LABSPEC 1.005 10/14/2015 0933   PHURINE 6.0 10/14/2015 0933   GLUCOSEU Negative 10/14/2015 0933   HGBUR Negative 10/14/2015 0933   BILIRUBINUR Negative 10/14/2015 0933   KETONESUR Negative 10/14/2015 0933   PROTEINUR Negative 10/14/2015 0933   UROBILINOGEN 0.2 10/14/2015 0933   NITRITE Negative 10/14/2015 0933   LEUKOCYTESUR Negative 10/14/2015 0933    STUDIES: Dg Chest 2 View  10/21/2015  CLINICAL DATA:  Cough and fever. EXAM: CHEST  2 VIEW COMPARISON:  None. FINDINGS: Prior port catheter noted with lead tip projected over the superior vena cava. Mediastinum and hilar structures are normal. Heart size normal. Mild bilateral from interstitial prominence noted consistent mild pneumonitis. No pleural effusion or pneumothorax. IMPRESSION: 1. Mild bilateral pulmonary interstitial prominence suggesting mild pneumonitis. 2. Power port catheter in stable position. Electronically Signed   By: Marcello Moores  Register   On: 10/21/2015 11:56    ASSESSMENT: 55 y.o. status post right breast biopsy 08/19/2015 for a clinical T1b N0, stage IA invasive ductal carcinoma, estrogen and progesterone receptor positive, HER-2 not amplified, with an MIB-1 of 5%  (1) status post right lumpectomy and sentinel lymph node sampling 09/01/2015 for a pT1c pN0, stage IA invasive ductal carcinoma, grade 2, with negative margins  (2)  Oncotype DX score of 23 falls in the intermediate range, and predicts a risk of recurrence outside the breast within 10 years of 15%, if the patient's only systemic therapy is tamoxifen for 5 years. It also predicts an absolute risk reduction of 4% with added chemotherapy  (3) cyclophosphamide and docetaxel x 4, given every 21 days with neulasta onpro to start 10/06/15  (4) adjuvant radiation to follow  (5) anti-estrogens to follow radiation  PLAN: Tracy Hart has improved tremendously. The labs were reviewed in detail. Her [place] has again jumped up to 16.1, with no use of neulasta or neupogen since her neutropenia 2 weeks ago. I consulted with Dr. Lindi Adie and he suggested proceeding with cycle 2 of cyclophosphamide and docetaxel as planned, along with neulasta onpro due to her history of severe neutropenia and fever during her nadir last cycle.   She has been reminded to call immediately with any fevers, chills, or other signs or symptoms of an infection.  Tracy Hart will return in 1 week for labs and a nadir visit. She understands and agrees with this plan. She knows the goal of treatment in her case is cure. She has been encouraged to call with any issues that might arise before her next visit here.   Laurie Panda, NP   11/04/2015 2:26 PM

## 2015-11-06 ENCOUNTER — Encounter: Payer: Self-pay | Admitting: *Deleted

## 2015-11-06 ENCOUNTER — Ambulatory Visit: Payer: BLUE CROSS/BLUE SHIELD

## 2015-11-11 ENCOUNTER — Ambulatory Visit (HOSPITAL_BASED_OUTPATIENT_CLINIC_OR_DEPARTMENT_OTHER): Payer: BLUE CROSS/BLUE SHIELD | Admitting: Nurse Practitioner

## 2015-11-11 ENCOUNTER — Other Ambulatory Visit (HOSPITAL_BASED_OUTPATIENT_CLINIC_OR_DEPARTMENT_OTHER): Payer: BLUE CROSS/BLUE SHIELD

## 2015-11-11 ENCOUNTER — Encounter: Payer: Self-pay | Admitting: Nurse Practitioner

## 2015-11-11 VITALS — BP 91/52 | HR 101 | Temp 98.6°F | Resp 18 | Ht 68.0 in | Wt 210.5 lb

## 2015-11-11 DIAGNOSIS — C50411 Malignant neoplasm of upper-outer quadrant of right female breast: Secondary | ICD-10-CM

## 2015-11-11 DIAGNOSIS — L03011 Cellulitis of right finger: Secondary | ICD-10-CM | POA: Diagnosis not present

## 2015-11-11 LAB — CBC WITH DIFFERENTIAL/PLATELET
BASO%: 0.7 % (ref 0.0–2.0)
Basophils Absolute: 0.1 10*3/uL (ref 0.0–0.1)
EOS%: 0.7 % (ref 0.0–7.0)
Eosinophils Absolute: 0.1 10*3/uL (ref 0.0–0.5)
HEMATOCRIT: 33.1 % — AB (ref 34.8–46.6)
HGB: 11.6 g/dL (ref 11.6–15.9)
LYMPH#: 3.4 10*3/uL — AB (ref 0.9–3.3)
LYMPH%: 17.7 % (ref 14.0–49.7)
MCH: 34 pg (ref 25.1–34.0)
MCHC: 35 g/dL (ref 31.5–36.0)
MCV: 97.1 fL (ref 79.5–101.0)
MONO#: 2.1 10*3/uL — AB (ref 0.1–0.9)
MONO%: 11.1 % (ref 0.0–14.0)
NEUT%: 69.8 % (ref 38.4–76.8)
NEUTROS ABS: 13.4 10*3/uL — AB (ref 1.5–6.5)
PLATELETS: 223 10*3/uL (ref 145–400)
RBC: 3.41 10*6/uL — ABNORMAL LOW (ref 3.70–5.45)
RDW: 13.4 % (ref 11.2–14.5)
WBC: 19.2 10*3/uL — AB (ref 3.9–10.3)

## 2015-11-11 LAB — COMPREHENSIVE METABOLIC PANEL
ALT: 26 U/L (ref 0–55)
AST: 17 U/L (ref 5–34)
Albumin: 3.5 g/dL (ref 3.5–5.0)
Alkaline Phosphatase: 83 U/L (ref 40–150)
Anion Gap: 9 mEq/L (ref 3–11)
BILIRUBIN TOTAL: 0.31 mg/dL (ref 0.20–1.20)
BUN: 19.1 mg/dL (ref 7.0–26.0)
CO2: 23 meq/L (ref 22–29)
Calcium: 8.6 mg/dL (ref 8.4–10.4)
Chloride: 102 mEq/L (ref 98–109)
Creatinine: 1 mg/dL (ref 0.6–1.1)
EGFR: 67 mL/min/{1.73_m2} — ABNORMAL LOW (ref >90)
GLUCOSE: 135 mg/dL (ref 70–140)
Potassium: 3.8 mEq/L (ref 3.5–5.1)
SODIUM: 135 meq/L — AB (ref 136–145)
TOTAL PROTEIN: 6.2 g/dL — AB (ref 6.4–8.3)

## 2015-11-11 NOTE — Progress Notes (Signed)
[place]  [phone] [phone]) [phone]     ID: Tracy Hart DOB: 07-12-1961  MR#: [id number]  PRX#:[id number]  Patient Care Team: Tracy Solian, MD as PCP - General (Internal Medicine) Tracy Gilmore [name], MD as Consulting Physician (Obstetrics and Gynecology) Tracy Cruel, MD as Consulting Physician (Oncology) Tracy Keens, MD as Consulting Physician (General Surgery) Tracy Rudd, MD as Consulting Physician (Radiation Oncology) PCP: Tracy Ringer, MD OTHER MD:  CHIEF COMPLAINT: Estrogen receptor positive breast cancer  CURRENT TREATMENT: Adjuvant treatment pending   BREAST CANCER HISTORY: From the original intake note:  "Tracy Hart" had routine screening mammography at Dr. Verlon Hart office late November, showing a possible mass in the right breast. On 08/04/2015 she underwent right diagnostic mammography with tomosynthesis and right breast ultrasonography at the breast Center. The breast density was category C. In the upper outer quadrant of the right breast there was a 0.8 cm area of asymmetry which was not palpable by exam. Ultrasound showed no suspicious mass or calcifications.  Biopsy of the area of asymmetry was performed 08/19/2015, and showed ([id number]) invasive and in situ ductal carcinoma, E-cadherin positive, estrogen receptor 90% positive, progesterone receptor 90% positive, both with strong staining intensity, with an MIB-1 of 5%, and no HER-2 amplification, the signals ratio being 1.44 and the number per cell 1.80.  The patient was then referred to surgery and after appropriate discussion underwent right lumpectomy and sentinel lymph node sampling 09/01/2015. The pathology from this procedure ([id number]) confirmed an invasive ductal carcinoma, grade 2, measuring 1.7 cm, with some low-grade ductal carcinoma in situ. Margins were negative. All 5 sentinel lymph nodes were clear.  The patient's subsequent history is as detailed  below.  INTERVAL HISTORY: Tracy Hart returns today for follow-up of her estrogen receptor positive breast cancer, alone. Today is day 8, cycle 2 of cyclophosphamide and docetaxel, with neulasta for granulocyte support.  REVIEW OF SYSTEMS: Tracy Hart says that this treatment is "heads and shoulders" better than the first cycle. Her nausea was minimal. She had just one episode of diarrhea managed with imodium. She had no fevers or chills. Her bone pain was managed well with claritin and aleve PRN. She denies mouth sores, rashes, or neuropathy symptoms. Her appetite is decent. She did have some swelling to the cuticle of her right nail bed, and pus escaped after she soaked it in epson salt. She recently got a Designer, multimedia. A detailed review of systems is otherwise stable.  PAST MEDICAL HISTORY: Past Medical History  Diagnosis Date  . Cancer [facility]) 2016    right breast  . Breast cancer ([place]) 08/19/15    right breast  . Anxiety     takes Xanax daily as needed  . Hypertension     takes Benicar daily  . Hypothyroidism     takes Synthroid daily  . Depression     takes Lexapro daily  . Seasonal allergies     takes Zyrtec and uses Flonase daily  . Hyperlipidemia     takes Atorvastatin daily  . History of bronchitis 2000  . History of hiatal hernia   . GERD (gastroesophageal reflux disease)   . Insomnia     takes Trazodone nightly    PAST SURGICAL HISTORY: Past Surgical History  Procedure Laterality Date  . Breast lumpectomy with radioactive seed and sentinel lymph node biopsy Right 09/01/2015    Procedure: RIGHT BREAST LUMPECTOMY WITH RADIOACTIVE SEED LOCALIZED AND SENTINEL LYMPH NODE BIOPSY;  Surgeon: Tracy Keens, MD;  Location: [facility];  Service: General;  Laterality: Right;  . Tonsillectomy  at age 64  . Hernia repair Right 2009  . Esophagogastroduodenoscopy    . Colonoscopy    . Portacath placement Left 09/29/2015    Procedure: INSERTION PORT-A-CATH;  Surgeon: Tracy Keens, MD;  Location: [facility] OR;  Service: General;  Laterality: Left;    FAMILY HISTORY Family History  Problem Relation Age of Onset  . Cancer Mother 63    breast  . Diabetes Mother   . Hypertension Mother   . Hypertension Father   . Cancer Sister 58    thyroid  . Seizures Sister    The patient's parents are in their early 33s as of January 2017. The patient had no brothers, 3 sisters. The patient's mother was diagnosed with breast cancer at age 47. The patient's sister was diagnosed with thyroid cancer at age 66. There is no history of ovarian cancer in the family.   GYNECOLOGIC HISTORY:  No LMP recorded. Patient is postmenopausal. Menarche age 47, the patient is GX P0. She was on oral contraceptives between 1996 and 2016, and at the time of her diagnosis had been on hormone replacement for 2 months.   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 Her husband Tracy Hart") works in Occupational hygienist for a Technical sales engineer. At home is just the 2 of them, with no pets. The patient at tends [place]    ADVANCED DIRECTIVES: In place. The patient's husband is her healthcare power of attorney with her sister Tracy Hart listed as secondary   HEALTH MAINTENANCE: Social History  Substance Use Topics  . Smoking status: Former Research scientist (life sciences)  . Smokeless tobacco: Not on file     Comment: quit smoking around 1990  . Alcohol Use: No     Colonoscopy: 2010/[facility]  PAP: November 2016  Bone density: Due/ at Dr Tracy Hart  Lipid panel:  Allergies  Allergen Reactions  . Codeine     No percocet, makes patient weird  . Sulfa Antibiotics Other (See Comments)    Caused mouth sores    Current Outpatient Prescriptions  Medication Sig Dispense Refill  . ALPRAZolam (XANAX) 0.5 MG tablet Take 0.25 mg by mouth at bedtime as needed for anxiety. Reported on 11/11/2015    . atorvastatin (LIPITOR) 40 MG tablet Take 40 mg by mouth daily.    . calcium  carbonate (OS-CAL) 600 MG TABS tablet Take 600 mg by mouth 2 (two) times daily with a meal. Reported on 10/03/2015    . cetirizine (ZYRTEC) 10 MG tablet Take 10 mg by mouth daily.    . cholecalciferol (VITAMIN D) 400 UNITS TABS tablet Take 800 Units by mouth 2 (two) times daily.    Marland Kitchen escitalopram (LEXAPRO) 10 MG tablet Take 10 mg by mouth daily.    Marland Kitchen levothyroxine (SYNTHROID, LEVOTHROID) 137 MCG tablet Take 137 mcg by mouth daily before breakfast.    . lidocaine-prilocaine (EMLA) cream Apply 1 application topically as needed. 30 g 3  . olmesartan-hydrochlorothiazide (BENICAR HCT) 40-25 MG per tablet Take 1 tablet by mouth daily.    . ondansetron (ZOFRAN) 8 MG tablet Take 1 tablet (8 mg total) by mouth 2 (two) times daily as needed for refractory nausea / vomiting. Start on day 3 after chemo. 30 tablet 1  . aspirin 81 MG tablet Take 81 mg by mouth daily. Reported on 11/11/2015    . dexamethasone (DECADRON) 4 MG tablet Take  2 tablets (8 mg total) by mouth 2 (two) times daily. Start the day before Taxotere. Then again the day after chemo for 3 days. (Patient not taking: Reported on 11/11/2015) 30 tablet 1  . fluticasone (FLONASE) 50 MCG/ACT nasal spray Place 1 spray into both nostrils daily. Reported on 11/11/2015    . LORazepam (ATIVAN) 0.5 MG tablet Take 1 tablet (0.5 mg total) by mouth at bedtime. as needed( Nausea or vomiting) (Patient not taking: Reported on 11/11/2015) 30 tablet 0  . Omega-3 Fatty Acids (FISH OIL) 1200 MG CAPS Take by mouth 2 (two) times daily. Reported on 11/11/2015    . prochlorperazine (COMPAZINE) 10 MG tablet Take 1 tablet (10 mg total) by mouth every 6 (six) hours as needed (Nausea or vomiting). (Patient not taking: Reported on 11/11/2015) 30 tablet 1  . traMADol (ULTRAM) 50 MG tablet Take 1-2 tablets (50-100 mg total) by mouth every 6 (six) hours as needed. (Patient not taking: Reported on 10/14/2015) 20 tablet 0  . traZODone (DESYREL) 50 MG tablet Take 50 mg by mouth at bedtime.  Reported on 11/11/2015     No current facility-administered medications for this visit.    OBJECTIVE: Middle-aged white woman in no acute distress Filed Vitals:   11/11/15 1046  BP: 91/52  Pulse: 101  Temp: 98.6 F (37 C)  Resp: 18     Body mass index is 32.01 kg/(m^2).    ECOG FS:1 - Symptomatic but completely ambulatory  Skin: warm, dry, area of swelling and redness to right middle finger to the lateral cuticle HEENT: sclerae anicteric, conjunctivae pink, oropharynx clear. No thrush or mucositis.  Lymph Nodes: No cervical or supraclavicular lymphadenopathy  Lungs: clear to auscultation bilaterally, no rales, wheezes, or rhonci  Heart: regular rate and rhythm  Abdomen: round, soft, non tender, positive bowel sounds  Musculoskeletal: No focal spinal tenderness, no peripheral edema  Neuro: non focal, well oriented, positive affect  Breasts: deferred  LAB RESULTS:  CMP     Component Value Date/Time   NA 135* 11/11/2015 1028   NA 139 09/26/2015 0836   K 3.8 11/11/2015 1028   K 4.1 09/26/2015 0836   CL 105 09/26/2015 0836   CO2 23 11/11/2015 1028   CO2 28 09/26/2015 0836   GLUCOSE 135 11/11/2015 1028   GLUCOSE 109* 09/26/2015 0836   BUN 19.1 11/11/2015 1028   BUN 15 09/26/2015 0836   CREATININE 1.0 11/11/2015 1028   CREATININE 0.82 09/26/2015 0836   CALCIUM 8.6 11/11/2015 1028   CALCIUM 9.8 09/26/2015 0836   PROT 6.2* 11/11/2015 1028   ALBUMIN 3.5 11/11/2015 1028   AST 17 11/11/2015 1028   ALT 26 11/11/2015 1028   ALKPHOS 83 11/11/2015 1028   BILITOT 0.31 11/11/2015 1028   GFRNONAA >60 09/26/2015 0836   GFRAA >60 09/26/2015 0836    INo results found for: SPEP, UPEP  Lab Results  Component Value Date   WBC 19.2* 11/11/2015   NEUTROABS 13.4* 11/11/2015   HGB 11.6 11/11/2015   HCT 33.1* 11/11/2015   MCV 97.1 11/11/2015   PLT 223 11/11/2015      Chemistry      Component Value Date/Time   NA 135* 11/11/2015 1028   NA 139 09/26/2015 0836   K 3.8 11/11/2015  1028   K 4.1 09/26/2015 0836   CL 105 09/26/2015 0836   CO2 23 11/11/2015 1028   CO2 28 09/26/2015 0836   BUN 19.1 11/11/2015 1028   BUN 15 09/26/2015 0836  CREATININE 1.0 11/11/2015 1028   CREATININE 0.82 09/26/2015 0836      Component Value Date/Time   CALCIUM 8.6 11/11/2015 1028   CALCIUM 9.8 09/26/2015 0836   ALKPHOS 83 11/11/2015 1028   AST 17 11/11/2015 1028   ALT 26 11/11/2015 1028   BILITOT 0.31 11/11/2015 1028       No results found for: LABCA2  No components found for: LABCA125  No results for input(s): INR in the last 168 hours.  Urinalysis    Component Value Date/Time   LABSPEC 1.005 10/14/2015 0933   PHURINE 6.0 10/14/2015 0933   GLUCOSEU Negative 10/14/2015 0933   HGBUR Negative 10/14/2015 0933   BILIRUBINUR Negative 10/14/2015 0933   KETONESUR Negative 10/14/2015 0933   PROTEINUR Negative 10/14/2015 0933   UROBILINOGEN 0.2 10/14/2015 0933   NITRITE Negative 10/14/2015 0933   LEUKOCYTESUR Negative 10/14/2015 0933    STUDIES: Dg Chest 2 View  10/21/2015  CLINICAL DATA:  Cough and fever. EXAM: CHEST  2 VIEW COMPARISON:  None. FINDINGS: Prior port catheter noted with lead tip projected over the superior vena cava. Mediastinum and hilar structures are normal. Heart size normal. Mild bilateral from interstitial prominence noted consistent mild pneumonitis. No pleural effusion or pneumothorax. IMPRESSION: 1. Mild bilateral pulmonary interstitial prominence suggesting mild pneumonitis. 2. Power port catheter in stable position. Electronically Signed   By: Marcello Moores  Register   On: 10/21/2015 11:56    ASSESSMENT: 55 y.o. status post right breast biopsy 08/19/2015 for a clinical T1b N0, stage IA invasive ductal carcinoma, estrogen and progesterone receptor positive, HER-2 not amplified, with an MIB-1 of 5%  (1) status post right lumpectomy and sentinel lymph node sampling 09/01/2015 for a pT1c pN0, stage IA invasive ductal carcinoma, grade 2, with negative  margins  (2) Oncotype DX score of 23 falls in the intermediate range, and predicts a risk of recurrence outside the breast within 10 years of 15%, if the patient's only systemic therapy is tamoxifen for 5 years. It also predicts an absolute risk reduction of 4% with added chemotherapy  (3) cyclophosphamide and docetaxel x 4, given every 21 days with neulasta onpro to start 10/06/15  (4) adjuvant radiation to follow  (5) anti-estrogens to follow radiation  PLAN: Tracy Hart had a much better experience with this second cycle of treatment. The labs were reviewed in detail and her [place] is still high at 13.4, but this is to be expected given her baseline ANC combined with neulasta use.   We have asked her to remove her nail polish and stop receiving manicures due to the infection to one of her right nails. The pus has already been drained from this area. She will soak them in tea tree oil.  Tracy Hart will return in 2 weeks for cycle 3 of treatment. She understands and agrees with this plan. She knows the goal of treatment in her case is cure. She has been encouraged to call with any issues that might arise before her next visit here.   Laurie Panda, NP   11/11/2015 11:54 AM

## 2015-11-12 ENCOUNTER — Ambulatory Visit: Payer: BLUE CROSS/BLUE SHIELD | Admitting: Oncology

## 2015-11-17 ENCOUNTER — Other Ambulatory Visit: Payer: Self-pay | Admitting: *Deleted

## 2015-11-17 ENCOUNTER — Telehealth: Payer: Self-pay | Admitting: *Deleted

## 2015-11-17 MED ORDER — DOXYCYCLINE HYCLATE 100 MG PO TABS: 100.0000 mg | ORAL_TABLET | Freq: Two times a day (BID) | ORAL | Status: DC

## 2015-11-17 NOTE — Telephone Encounter (Signed)
Called pt back to let her know to p/u Rx for doxycycline for rash at her pharmacy. It seems she has a rash from the chemo. I told pt if rash worsens to give [place] a call. I will f/u towards end of week to see if it's getting better. Pt verbalized understanding. No further concerns. Message to be fwd to [employer].

## 2015-11-17 NOTE — Telephone Encounter (Signed)
Pt called concerning a rash she has developed since Friday 3/24. She said the rash is above her eye, on both cheeks and down her neck. She described them as red pimples and some are blistery. Denies any pain to pimples but says she tries not to touch them. Pt is in [place] with a sick mom, but will be traveling back today. I told her I will let NP know and if we need to see her or call in medication to pharmacy. Message to be fwd to [employer].

## 2015-11-18 ENCOUNTER — Other Ambulatory Visit: Payer: BLUE CROSS/BLUE SHIELD

## 2015-11-18 ENCOUNTER — Ambulatory Visit: Payer: BLUE CROSS/BLUE SHIELD

## 2015-11-18 ENCOUNTER — Ambulatory Visit: Payer: BLUE CROSS/BLUE SHIELD | Admitting: Oncology

## 2015-11-20 ENCOUNTER — Ambulatory Visit: Payer: BLUE CROSS/BLUE SHIELD

## 2015-11-20 NOTE — Telephone Encounter (Signed)
Called pt to f/u about the rash she had developed earlier in the week. No answer but left a message for pt to call this nurse back. Before completing this note pt called back. Pt said her rash is much better, the doxycycline is working. Rash beginning to dry out. Pt thanked [place] for following up and making sure she was all right. No other issues. Pt knows her appt is on 11/25/15.

## 2015-11-25 ENCOUNTER — Other Ambulatory Visit: Payer: Self-pay | Admitting: Oncology

## 2015-11-25 ENCOUNTER — Encounter: Payer: Self-pay | Admitting: Nurse Practitioner

## 2015-11-25 ENCOUNTER — Ambulatory Visit (HOSPITAL_BASED_OUTPATIENT_CLINIC_OR_DEPARTMENT_OTHER): Payer: BLUE CROSS/BLUE SHIELD | Admitting: Nurse Practitioner

## 2015-11-25 ENCOUNTER — Ambulatory Visit (HOSPITAL_BASED_OUTPATIENT_CLINIC_OR_DEPARTMENT_OTHER): Payer: BLUE CROSS/BLUE SHIELD

## 2015-11-25 ENCOUNTER — Other Ambulatory Visit (HOSPITAL_BASED_OUTPATIENT_CLINIC_OR_DEPARTMENT_OTHER): Payer: BLUE CROSS/BLUE SHIELD

## 2015-11-25 ENCOUNTER — Encounter: Payer: Self-pay | Admitting: *Deleted

## 2015-11-25 VITALS — BP 118/55 | HR 90 | Temp 98.0°F | Resp 18 | Ht 68.0 in | Wt 217.9 lb

## 2015-11-25 DIAGNOSIS — C50411 Malignant neoplasm of upper-outer quadrant of right female breast: Secondary | ICD-10-CM

## 2015-11-25 DIAGNOSIS — Z5189 Encounter for other specified aftercare: Secondary | ICD-10-CM

## 2015-11-25 DIAGNOSIS — D72829 Elevated white blood cell count, unspecified: Secondary | ICD-10-CM | POA: Diagnosis not present

## 2015-11-25 DIAGNOSIS — Z5111 Encounter for antineoplastic chemotherapy: Secondary | ICD-10-CM | POA: Diagnosis not present

## 2015-11-25 LAB — CBC WITH DIFFERENTIAL/PLATELET
BASO%: 0.1 % (ref 0.0–2.0)
Basophils Absolute: 0 10*3/uL (ref 0.0–0.1)
EOS%: 0 % (ref 0.0–7.0)
Eosinophils Absolute: 0 10*3/uL (ref 0.0–0.5)
HEMATOCRIT: 30.8 % — AB (ref 34.8–46.6)
HEMOGLOBIN: 10.7 g/dL — AB (ref 11.6–15.9)
LYMPH#: 1.7 10*3/uL (ref 0.9–3.3)
LYMPH%: 10.7 % — ABNORMAL LOW (ref 14.0–49.7)
MCH: 34.3 pg — ABNORMAL HIGH (ref 25.1–34.0)
MCHC: 34.7 g/dL (ref 31.5–36.0)
MCV: 98.7 fL (ref 79.5–101.0)
MONO#: 0.7 10*3/uL (ref 0.1–0.9)
MONO%: 4.3 % (ref 0.0–14.0)
NEUT#: 13.3 10*3/uL — ABNORMAL HIGH (ref 1.5–6.5)
NEUT%: 84.9 % — AB (ref 38.4–76.8)
PLATELETS: 326 10*3/uL (ref 145–400)
RBC: 3.12 10*6/uL — ABNORMAL LOW (ref 3.70–5.45)
RDW: 15.2 % — ABNORMAL HIGH (ref 11.2–14.5)
WBC: 15.7 10*3/uL — ABNORMAL HIGH (ref 3.9–10.3)

## 2015-11-25 LAB — COMPREHENSIVE METABOLIC PANEL
ALBUMIN: 3.7 g/dL (ref 3.5–5.0)
ALK PHOS: 70 U/L (ref 40–150)
ALT: 26 U/L (ref 0–55)
ANION GAP: 8 meq/L (ref 3–11)
AST: 17 U/L (ref 5–34)
BILIRUBIN TOTAL: 0.44 mg/dL (ref 0.20–1.20)
BUN: 13.9 mg/dL (ref 7.0–26.0)
CALCIUM: 9.2 mg/dL (ref 8.4–10.4)
CHLORIDE: 104 meq/L (ref 98–109)
CO2: 24 mEq/L (ref 22–29)
CREATININE: 0.8 mg/dL (ref 0.6–1.1)
EGFR: 81 mL/min/{1.73_m2} — ABNORMAL LOW (ref >90)
Glucose: 199 mg/dl — ABNORMAL HIGH (ref 70–140)
Potassium: 4.4 mEq/L (ref 3.5–5.1)
Sodium: 136 mEq/L (ref 136–145)
TOTAL PROTEIN: 6.6 g/dL (ref 6.4–8.3)

## 2015-11-25 MED ORDER — PALONOSETRON HCL INJECTION 0.25 MG/5ML
INTRAVENOUS | Status: AC
  Filled 2015-11-25: qty 5

## 2015-11-25 MED ORDER — PEGFILGRASTIM 6 MG/0.6ML ~~LOC~~ PSKT
6.0000 mg | PREFILLED_SYRINGE | Freq: Once | SUBCUTANEOUS | Status: AC
  Administered 2015-11-25: 6 mg via SUBCUTANEOUS
  Filled 2015-11-25: qty 0.6

## 2015-11-25 MED ORDER — PALONOSETRON HCL INJECTION 0.25 MG/5ML
0.2500 mg | Freq: Once | INTRAVENOUS | Status: AC
  Administered 2015-11-25: 0.25 mg via INTRAVENOUS

## 2015-11-25 MED ORDER — SODIUM CHLORIDE 0.9 % IV SOLN
Freq: Once | INTRAVENOUS | Status: AC
  Administered 2015-11-25: 13:00:00 via INTRAVENOUS

## 2015-11-25 MED ORDER — HEPARIN SOD (PORK) LOCK FLUSH 100 UNIT/ML IV SOLN
500.0000 [IU] | Freq: Once | INTRAVENOUS | Status: AC | PRN
  Administered 2015-11-25: 500 [IU]
  Filled 2015-11-25: qty 5

## 2015-11-25 MED ORDER — DEXAMETHASONE SODIUM PHOSPHATE 100 MG/10ML IJ SOLN
10.0000 mg | Freq: Once | INTRAMUSCULAR | Status: AC
  Administered 2015-11-25: 10 mg via INTRAVENOUS
  Filled 2015-11-25: qty 1

## 2015-11-25 MED ORDER — CYCLOPHOSPHAMIDE CHEMO INJECTION 1 GM
600.0000 mg/m2 | Freq: Once | INTRAMUSCULAR | Status: AC
  Administered 2015-11-25: 1300 mg via INTRAVENOUS
  Filled 2015-11-25: qty 65

## 2015-11-25 MED ORDER — SODIUM CHLORIDE 0.9% FLUSH
10.0000 mL | INTRAVENOUS | Status: DC | PRN
  Administered 2015-11-25: 10 mL
  Filled 2015-11-25: qty 10

## 2015-11-25 MED ORDER — SODIUM CHLORIDE 0.9 % IV SOLN
75.0000 mg/m2 | Freq: Once | INTRAVENOUS | Status: AC
  Administered 2015-11-25: 160 mg via INTRAVENOUS
  Filled 2015-11-25: qty 16

## 2015-11-25 NOTE — Progress Notes (Signed)
[name]  [phone] [phone]   ID: Tracy Hart DOB: [birth year]  MR#: [id number]  PQZ#:[id number]  Patient Care Team: Prince Solian, MD as PCP - General (Internal Medicine) [name] [name], MD as Consulting Physician (Obstetrics and Gynecology) [name], MD as Consulting Physician (Oncology) [name], MD as Consulting Physician (General Surgery) [name], MD as Consulting Physician (Radiation Oncology) PCP: [name], MD OTHER MD:  CHIEF COMPLAINT: Estrogen receptor positive breast cancer  CURRENT TREATMENT: Adjuvant treatment pending  BREAST CANCER HISTORY: From the original intake note:  "Tracy Hart" had routine screening mammography at Dr. [name] office late November, showing a possible mass in the right breast. On 08/04/2015 she underwent right diagnostic mammography with tomosynthesis and right breast ultrasonography at the breast Center. The breast density was category C. In the upper outer quadrant of the right breast there was a 0.8 cm area of asymmetry which was not palpable by exam. Ultrasound showed no suspicious mass or calcifications.  Biopsy of the area of asymmetry was performed 08/19/2015, and showed ([id number]) invasive and in situ ductal carcinoma, E-cadherin positive, estrogen receptor 90% positive, progesterone receptor 90% positive, both with strong staining intensity, with an MIB-1 of 5%, and no HER-2 amplification, the signals ratio being 1.44 and the number per cell 1.80.  The patient was then referred to surgery and after appropriate discussion underwent right lumpectomy and sentinel lymph node sampling 09/01/2015. The pathology from this procedure ([id number]) confirmed an invasive ductal carcinoma, grade 2, measuring 1.7 cm, with some low-grade ductal carcinoma in situ. Margins were negative. All 5 sentinel lymph nodes were clear.  The patient's subsequent history is as detailed below.  INTERVAL  HISTORY: Tracy Hart returns today for follow-up of her estrogen receptor positive breast cancer, accompanied by her husband, [name]. Today is day 1, cycle 3 of cyclophosphamide and docetaxel, with neulasta for granulocyte support.  REVIEW OF SYSTEMS: Tracy Hart denies fevers, chills, nausea, or vomiting. She has loose stool and occasionally takes imodium. She is having some clear nasal drainage. She has a history of seasonal allergies and takes zyrtec and flonase daily. Her appetite is good. She denies mouth sores. The rash to her chest and face cleared with doxycycline. Her face is flushed likely from steroid use. She denies neuropathy symptoms. She has removed all of the shellac from her nails. The right thumbnail is buckling, discolored, and tender with palpation. A detailed review of systems is otherwise stable.   PAST MEDICAL HISTORY: Past Medical History  Diagnosis Date  . Cancer [facility]) 2016    right breast  . Breast cancer ([facility]) 08/19/15    right breast  . Anxiety     takes Xanax daily as needed  . Hypertension     takes Benicar daily  . Hypothyroidism     takes Synthroid daily  . Depression     takes Lexapro daily  . Seasonal allergies     takes Zyrtec and uses Flonase daily  . Hyperlipidemia     takes Atorvastatin daily  . History of bronchitis 2000  . History of hiatal hernia   . GERD (gastroesophageal reflux disease)   . Insomnia     takes Trazodone nightly    PAST SURGICAL HISTORY: Past Surgical History  Procedure Laterality Date  . Breast lumpectomy with radioactive seed and sentinel lymph node biopsy Right 09/01/2015    Procedure: RIGHT BREAST LUMPECTOMY WITH RADIOACTIVE SEED LOCALIZED AND SENTINEL LYMPH NODE BIOPSY;  Surgeon: [name], MD;  Location:  [facility];  Service: General;  Laterality: Right;  . Tonsillectomy  at age 55  . Hernia repair Right 2009  . Esophagogastroduodenoscopy    . Colonoscopy    . Portacath placement Left 09/29/2015    Procedure:  INSERTION PORT-A-CATH;  Surgeon: [name], MD;  Location: [facility];  Service: General;  Laterality: Left;    FAMILY HISTORY Family History  Problem Relation Age of Onset  . Cancer Mother 90    breast  . Diabetes Mother   . Hypertension Mother   . Hypertension Father   . Cancer Sister 85    thyroid  . Seizures Sister    The patient's parents are in their early 57s as of January 2017. The patient had no brothers, 3 sisters. The patient's mother was diagnosed with breast cancer at age 57. The patient's sister was diagnosed with thyroid cancer at age 57. There is no history of ovarian cancer in the family.   GYNECOLOGIC HISTORY:  No LMP recorded. Patient is postmenopausal. Menarche age 16, the patient is GX P0. She was on oral contraceptives between 1996 and 2016, and at the time of her diagnosis had been on hormone replacement for 2 months.   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 Her husband [name]") works in Occupational hygienist for a Technical sales engineer. At home is just the 2 of them, with no pets. The patient at tends [place]    ADVANCED DIRECTIVES: In place. The patient's husband is her healthcare power of attorney with her sister [name] listed as secondary   HEALTH MAINTENANCE: Social History  Substance Use Topics  . Smoking status: Former Research scientist (life sciences)  . Smokeless tobacco: Not on file     Comment: quit smoking around 1990  . Alcohol Use: No     Colonoscopy: 2010/[facility]  PAP: November 2016  Bone density: Due/ at Dr [name]  Lipid panel:  Allergies  Allergen Reactions  . Codeine     No percocet, makes patient weird  . Sulfa Antibiotics Other (See Comments)    Caused mouth sores    Current Outpatient Prescriptions  Medication Sig Dispense Refill  . ALPRAZolam (XANAX) 0.5 MG tablet Take 0.25 mg by mouth at bedtime as needed for anxiety. Reported on 11/25/2015    . aspirin 81 MG tablet Take 81 mg by  mouth daily. Reported on 11/11/2015    . atorvastatin (LIPITOR) 40 MG tablet Take 40 mg by mouth daily.    . calcium carbonate (OS-CAL) 600 MG TABS tablet Take 600 mg by mouth 2 (two) times daily with a meal. Reported on 10/03/2015    . cetirizine (ZYRTEC) 10 MG tablet Take 10 mg by mouth daily.    . cholecalciferol (VITAMIN D) 400 UNITS TABS tablet Take 800 Units by mouth 2 (two) times daily.    Marland Kitchen dexamethasone (DECADRON) 4 MG tablet Take 2 tablets (8 mg total) by mouth 2 (two) times daily. Start the day before Taxotere. Then again the day after chemo for 3 days. 30 tablet 1  . doxycycline (VIBRA-TABS) 100 MG tablet Take 1 tablet (100 mg total) by mouth 2 (two) times daily. 30 tablet 1  . escitalopram (LEXAPRO) 10 MG tablet Take 10 mg by mouth daily.    Marland Kitchen levothyroxine (SYNTHROID, LEVOTHROID) 137 MCG tablet Take 137 mcg by mouth daily before breakfast.    . lidocaine-prilocaine (EMLA) cream Apply 1 application topically as needed. 30 g 3  .  olmesartan-hydrochlorothiazide (BENICAR HCT) 40-25 MG per tablet Take 1 tablet by mouth daily. Pt is taking 1/2 tablet daily instructed by PCP.    Marland Kitchen Omega-3 Fatty Acids (FISH OIL) 1200 MG CAPS Take by mouth 2 (two) times daily. Reported on 11/11/2015    . ondansetron (ZOFRAN) 8 MG tablet Take 1 tablet (8 mg total) by mouth 2 (two) times daily as needed for refractory nausea / vomiting. Start on day 3 after chemo. 30 tablet 1  . fluticasone (FLONASE) 50 MCG/ACT nasal spray Place 1 spray into both nostrils daily. Reported on 11/25/2015    . LORazepam (ATIVAN) 0.5 MG tablet Take 1 tablet (0.5 mg total) by mouth at bedtime. as needed( Nausea or vomiting) (Patient not taking: Reported on 11/11/2015) 30 tablet 0  . prochlorperazine (COMPAZINE) 10 MG tablet Take 1 tablet (10 mg total) by mouth every 6 (six) hours as needed (Nausea or vomiting). (Patient not taking: Reported on 11/11/2015) 30 tablet 1  . traMADol (ULTRAM) 50 MG tablet Take 1-2 tablets (50-100 mg total) by mouth  every 6 (six) hours as needed. (Patient not taking: Reported on 10/14/2015) 20 tablet 0  . traZODone (DESYREL) 50 MG tablet Take 50 mg by mouth at bedtime. Reported on 11/25/2015     No current facility-administered medications for this visit.    OBJECTIVE: Middle-aged white woman in no acute distress Filed Vitals:   11/25/15 1020  BP: 118/55  Pulse: 90  Temp: 98 F (36.7 C)  Resp: 18     Body mass index is 33.14 kg/(m^2).    ECOG FS:1 - Symptomatic but completely ambulatory  Sclerae unicteric, pupils round and equal Oropharynx clear and moist-- no thrush or other lesions No cervical or supraclavicular adenopathy Lungs no rales or rhonchi Heart regular rate and rhythm Abd soft, nontender, positive bowel sounds MSK no focal spinal tenderness, no upper extremity lymphedema Neuro: nonfocal, well oriented, appropriate affect Breasts: deferred  LAB RESULTS:  CMP     Component Value Date/Time   NA 136 11/25/2015 1009   NA 139 09/26/2015 0836   K 4.4 11/25/2015 1009   K 4.1 09/26/2015 0836   CL 105 09/26/2015 0836   CO2 24 11/25/2015 1009   CO2 28 09/26/2015 0836   GLUCOSE 199* 11/25/2015 1009   GLUCOSE 109* 09/26/2015 0836   BUN 13.9 11/25/2015 1009   BUN 15 09/26/2015 0836   CREATININE 0.8 11/25/2015 1009   CREATININE 0.82 09/26/2015 0836   CALCIUM 9.2 11/25/2015 1009   CALCIUM 9.8 09/26/2015 0836   PROT 6.6 11/25/2015 1009   ALBUMIN 3.7 11/25/2015 1009   AST 17 11/25/2015 1009   ALT 26 11/25/2015 1009   ALKPHOS 70 11/25/2015 1009   BILITOT 0.44 11/25/2015 1009   GFRNONAA >60 09/26/2015 0836   GFRAA >60 09/26/2015 0836    INo results found for: SPEP, UPEP  Lab Results  Component Value Date   WBC 15.7* 11/25/2015   NEUTROABS 13.3* 11/25/2015   HGB 10.7* 11/25/2015   HCT 30.8* 11/25/2015   MCV 98.7 11/25/2015   PLT 326 11/25/2015      Chemistry      Component Value Date/Time   NA 136 11/25/2015 1009   NA 139 09/26/2015 0836   K 4.4 11/25/2015 1009   K  4.1 09/26/2015 0836   CL 105 09/26/2015 0836   CO2 24 11/25/2015 1009   CO2 28 09/26/2015 0836   BUN 13.9 11/25/2015 1009   BUN 15 09/26/2015 0836   CREATININE 0.8 11/25/2015  1009   CREATININE 0.82 09/26/2015 0836      Component Value Date/Time   CALCIUM 9.2 11/25/2015 1009   CALCIUM 9.8 09/26/2015 0836   ALKPHOS 70 11/25/2015 1009   AST 17 11/25/2015 1009   ALT 26 11/25/2015 1009   BILITOT 0.44 11/25/2015 1009       No results found for: LABCA2  No components found for: LABCA125  No results for input(s): INR in the last 168 hours.  Urinalysis    Component Value Date/Time   LABSPEC 1.005 10/14/2015 0933   PHURINE 6.0 10/14/2015 0933   GLUCOSEU Negative 10/14/2015 0933   HGBUR Negative 10/14/2015 0933   BILIRUBINUR Negative 10/14/2015 0933   KETONESUR Negative 10/14/2015 0933   PROTEINUR Negative 10/14/2015 0933   UROBILINOGEN 0.2 10/14/2015 0933   NITRITE Negative 10/14/2015 0933   LEUKOCYTESUR Negative 10/14/2015 0933    STUDIES: No results found.  ASSESSMENT: 55 y.o. status post right breast biopsy 08/19/2015 for a clinical T1b N0, stage IA invasive ductal carcinoma, estrogen and progesterone receptor positive, HER-2 not amplified, with an MIB-1 of 5%  (1) status post right lumpectomy and sentinel lymph node sampling 09/01/2015 for a pT1c pN0, stage IA invasive ductal carcinoma, grade 2, with negative margins  (2) Oncotype DX score of 23 falls in the intermediate range, and predicts a risk of recurrence outside the breast within 10 years of 15%, if the patient's only systemic therapy is tamoxifen for 5 years. It also predicts an absolute risk reduction of 4% with added chemotherapy  (3) cyclophosphamide and docetaxel x 4, given every 21 days with neulasta onpro to start 10/06/15  (4) adjuvant radiation to follow  (5) anti-estrogens to follow radiation  PLAN: Tracy Hart feels good today. The labs were reviewed in detail and were stable. She has continued  leukocytosis, but we will continue to utilize neulasta onpro per Dr. Jana Hakim. The one cycle we chose not to, she was severely neutropenic and was almost febrile. She will proceed with cycle 3 of cyclophosphamide and docetaxel as planned today.   Tracy Hart will return in 1 week for labs and a nadir visit. She understands and agrees with this plan. She knows the goal of treatment in her case is cure. She has been encouraged to call with any issues that might arise before her next visit here.   Laurie Panda, NP   11/25/2015 11:01 AM

## 2015-11-25 NOTE — Patient Instructions (Signed)
[facility] Discharge Instructions for Patients Receiving Chemotherapy  Today you received the following chemotherapy agents Taxotere/Cytoxan.  To help prevent nausea and vomiting after your treatment, we encourage you to take your nausea medication as directed.   If you develop nausea and vomiting that is not controlled by your nausea medication, call the clinic.   BELOW ARE SYMPTOMS THAT SHOULD BE REPORTED IMMEDIATELY:  *FEVER GREATER THAN 100.5 F  *CHILLS WITH OR WITHOUT FEVER  NAUSEA AND VOMITING THAT IS NOT CONTROLLED WITH YOUR NAUSEA MEDICATION  *UNUSUAL SHORTNESS OF BREATH  *UNUSUAL BRUISING OR BLEEDING  TENDERNESS IN MOUTH AND THROAT WITH OR WITHOUT PRESENCE OF ULCERS  *URINARY PROBLEMS  *BOWEL PROBLEMS  UNUSUAL RASH Items with * indicate a potential emergency and should be followed up as soon as possible.  Feel free to call the clinic you have any questions or concerns. The clinic phone number is [phone].  Please show the CHEMO ALERT CARD at check-in to the Emergency Department and triage nurse.    

## 2015-12-02 ENCOUNTER — Other Ambulatory Visit (HOSPITAL_BASED_OUTPATIENT_CLINIC_OR_DEPARTMENT_OTHER): Payer: BLUE CROSS/BLUE SHIELD

## 2015-12-02 ENCOUNTER — Ambulatory Visit (HOSPITAL_BASED_OUTPATIENT_CLINIC_OR_DEPARTMENT_OTHER): Payer: BLUE CROSS/BLUE SHIELD | Admitting: Nurse Practitioner

## 2015-12-02 ENCOUNTER — Encounter: Payer: Self-pay | Admitting: Nurse Practitioner

## 2015-12-02 VITALS — BP 102/61 | HR 95 | Temp 98.3°F | Resp 18 | Ht 68.0 in | Wt 214.5 lb

## 2015-12-02 DIAGNOSIS — C50411 Malignant neoplasm of upper-outer quadrant of right female breast: Secondary | ICD-10-CM | POA: Diagnosis not present

## 2015-12-02 DIAGNOSIS — R232 Flushing: Secondary | ICD-10-CM

## 2015-12-02 DIAGNOSIS — N951 Menopausal and female climacteric states: Secondary | ICD-10-CM

## 2015-12-02 LAB — COMPREHENSIVE METABOLIC PANEL
ALT: 24 U/L (ref 0–55)
AST: 18 U/L (ref 5–34)
Albumin: 3.5 g/dL (ref 3.5–5.0)
Alkaline Phosphatase: 81 U/L (ref 40–150)
Anion Gap: 8 mEq/L (ref 3–11)
BUN: 12.8 mg/dL (ref 7.0–26.0)
CALCIUM: 9.1 mg/dL (ref 8.4–10.4)
CHLORIDE: 103 meq/L (ref 98–109)
CO2: 27 meq/L (ref 22–29)
CREATININE: 0.8 mg/dL (ref 0.6–1.1)
EGFR: 80 mL/min/{1.73_m2} — ABNORMAL LOW (ref >90)
Glucose: 148 mg/dl — ABNORMAL HIGH (ref 70–140)
Potassium: 4.5 mEq/L (ref 3.5–5.1)
Sodium: 137 mEq/L (ref 136–145)
TOTAL PROTEIN: 6.1 g/dL — AB (ref 6.4–8.3)
Total Bilirubin: 0.4 mg/dL (ref 0.20–1.20)

## 2015-12-02 LAB — CBC WITH DIFFERENTIAL/PLATELET
BASO%: 1 % (ref 0.0–2.0)
Basophils Absolute: 0.1 10*3/uL (ref 0.0–0.1)
EOS%: 0.5 % (ref 0.0–7.0)
Eosinophils Absolute: 0 10*3/uL (ref 0.0–0.5)
HEMATOCRIT: 32.9 % — AB (ref 34.8–46.6)
HGB: 11.3 g/dL — ABNORMAL LOW (ref 11.6–15.9)
LYMPH#: 1.9 10*3/uL (ref 0.9–3.3)
LYMPH%: 20 % (ref 14.0–49.7)
MCH: 34.6 pg — ABNORMAL HIGH (ref 25.1–34.0)
MCHC: 34.4 g/dL (ref 31.5–36.0)
MCV: 100.5 fL (ref 79.5–101.0)
MONO#: 1.1 10*3/uL — ABNORMAL HIGH (ref 0.1–0.9)
MONO%: 11.6 % (ref 0.0–14.0)
NEUT%: 66.9 % (ref 38.4–76.8)
NEUTROS ABS: 6.3 10*3/uL (ref 1.5–6.5)
Platelets: 184 10*3/uL (ref 145–400)
RBC: 3.27 10*6/uL — AB (ref 3.70–5.45)
RDW: 15.2 % — ABNORMAL HIGH (ref 11.2–14.5)
WBC: 9.4 10*3/uL (ref 3.9–10.3)

## 2015-12-02 MED ORDER — GABAPENTIN 300 MG PO CAPS: 300.0000 mg | ORAL_CAPSULE | Freq: Three times a day (TID) | ORAL | Status: DC

## 2015-12-02 NOTE — Progress Notes (Signed)
[facility]  [phone]) [phone] [phone]) [phone]   ID: [name] DOB: 02/01/1961  MR#: [id number]  VOH#:[id number]  Patient Care Team: Prince Solian, MD as PCP - General (Internal Medicine) [name] [name], MD as Consulting Physician (Obstetrics and Gynecology) [name], MD as Consulting Physician (Oncology) [name], MD as Consulting Physician (General Surgery) [name], MD as Consulting Physician (Radiation Oncology) PCP: [name], MD OTHER MD:  CHIEF COMPLAINT: Estrogen receptor positive breast cancer  CURRENT TREATMENT: Adjuvant treatment pending  BREAST CANCER HISTORY: From the original intake note:  "Tracy Hart" had routine screening mammography at Dr. [name] office late November, showing a possible mass in the right breast. On 08/04/2015 she underwent right diagnostic mammography with tomosynthesis and right breast ultrasonography at the breast Center. The breast density was category C. In the upper outer quadrant of the right breast there was a 0.8 cm area of asymmetry which was not palpable by exam. Ultrasound showed no suspicious mass or calcifications.  Biopsy of the area of asymmetry was performed 08/19/2015, and showed ([id number]) invasive and in situ ductal carcinoma, E-cadherin positive, estrogen receptor 90% positive, progesterone receptor 90% positive, both with strong staining intensity, with an MIB-1 of 5%, and no HER-2 amplification, the signals ratio being 1.44 and the number per cell 1.80.  The patient was then referred to surgery and after appropriate discussion underwent right lumpectomy and sentinel lymph node sampling 09/01/2015. The pathology from this procedure ([id number]) confirmed an invasive ductal carcinoma, grade 2, measuring 1.7 cm, with some low-grade ductal carcinoma in situ. Margins were negative. All 5 sentinel lymph nodes were clear.  The patient's subsequent history is as detailed below.  INTERVAL  HISTORY: Tracy Hart returns today for follow-up of her estrogen receptor positive breast cancer, alone. Today is day 8, cycle 3 of cyclophosphamide and docetaxel, with neulasta for granulocyte support.  REVIEW OF SYSTEMS: Tracy Hart denies fevers or chills. Her nausea is minimal and can often be managed with a cold ginger ale. She uses imodium PRN for loose stools but that was less of an issue this week. She denies mouth sores, rashes, or neuropathy symptoms. Her appetite is pretty good, but can be twarted with a bad taste sensation on some days. She is having an increase in hot flashes, especially at night. She had more bone pain this past cycle, but it was managed well with claritin and ibuprofen. Her energy level is decent. A detailed review of systems is otherwise stable.  PAST MEDICAL HISTORY: Past Medical History  Diagnosis Date  . Cancer [facility]) 2016    right breast  . Breast cancer ([facility]) 08/19/15    right breast  . Anxiety     takes Xanax daily as needed  . Hypertension     takes Benicar daily  . Hypothyroidism     takes Synthroid daily  . Depression     takes Lexapro daily  . Seasonal allergies     takes Zyrtec and uses Flonase daily  . Hyperlipidemia     takes Atorvastatin daily  . History of bronchitis 2000  . History of hiatal hernia   . GERD (gastroesophageal reflux disease)   . Insomnia     takes Trazodone nightly    PAST SURGICAL HISTORY: Past Surgical History  Procedure Laterality Date  . Breast lumpectomy with radioactive seed and sentinel lymph node biopsy Right 09/01/2015    Procedure: RIGHT BREAST LUMPECTOMY WITH RADIOACTIVE SEED LOCALIZED AND SENTINEL LYMPH NODE BIOPSY;  Surgeon: [name],  MD;  Location: [facility];  Service: General;  Laterality: Right;  . Tonsillectomy  at age 69  . Hernia repair Right 2009  . Esophagogastroduodenoscopy    . Colonoscopy    . Portacath placement Left 09/29/2015    Procedure: INSERTION PORT-A-CATH;  Surgeon:  [name], MD;  Location: [facility] OR;  Service: General;  Laterality: Left;    FAMILY HISTORY Family History  Problem Relation Age of Onset  . Cancer Mother 73    breast  . Diabetes Mother   . Hypertension Mother   . Hypertension Father   . Cancer Sister 22    thyroid  . Seizures Sister    The patient's parents are in their early 76s as of January 2017. The patient had no brothers, 3 sisters. The patient's mother was diagnosed with breast cancer at age 61. The patient's sister was diagnosed with thyroid cancer at age 57. There is no history of ovarian cancer in the family.   GYNECOLOGIC HISTORY:  No LMP recorded. Patient is postmenopausal. Menarche age 22, the patient is GX P0. She was on oral contraceptives between 1996 and 2016, and at the time of her diagnosis had been on hormone replacement for 2 months.   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 Her husband [name]") works in Occupational hygienist for a Technical sales engineer. At home is just the 2 of them, with no pets. The patient at tends [name]    ADVANCED DIRECTIVES: In place. The patient's husband is her healthcare power of attorney with her sister [name] listed as secondary   HEALTH MAINTENANCE: Social History  Substance Use Topics  . Smoking status: Former Research scientist (life sciences)  . Smokeless tobacco: Not on file     Comment: quit smoking around 1990  . Alcohol Use: No     Colonoscopy: 2010/[facility]  PAP: November 2016  Bone density: Due/ at Dr [name]  Lipid panel:  Allergies  Allergen Reactions  . Codeine     No percocet, makes patient weird  . Sulfa Antibiotics Other (See Comments)    Caused mouth sores    Current Outpatient Prescriptions  Medication Sig Dispense Refill  . ALPRAZolam (XANAX) 0.5 MG tablet Take 0.25 mg by mouth at bedtime as needed for anxiety. Reported on 11/25/2015    . atorvastatin (LIPITOR) 40 MG tablet Take 40 mg by mouth daily.    .  calcium carbonate (OS-CAL) 600 MG TABS tablet Take 600 mg by mouth 2 (two) times daily with a meal. Reported on 10/03/2015    . cetirizine (ZYRTEC) 10 MG tablet Take 10 mg by mouth daily.    . cholecalciferol (VITAMIN D) 400 UNITS TABS tablet Take 800 Units by mouth 2 (two) times daily.    Marland Kitchen dexamethasone (DECADRON) 4 MG tablet Take 2 tablets (8 mg total) by mouth 2 (two) times daily. Start the day before Taxotere. Then again the day after chemo for 3 days. 30 tablet 1  . doxycycline (VIBRA-TABS) 100 MG tablet Take 1 tablet (100 mg total) by mouth 2 (two) times daily. 30 tablet 1  . escitalopram (LEXAPRO) 10 MG tablet Take 10 mg by mouth daily.    . fluticasone (FLONASE) 50 MCG/ACT nasal spray Place 1 spray into both nostrils daily. Reported on 11/25/2015    . levothyroxine (SYNTHROID, LEVOTHROID) 137 MCG tablet Take 137 mcg by mouth daily before breakfast.    . lidocaine-prilocaine (EMLA) cream Apply 1 application  topically as needed. 30 g 3  . olmesartan-hydrochlorothiazide (BENICAR HCT) 40-25 MG per tablet Take 1 tablet by mouth daily. Pt is taking 1/2 tablet daily instructed by PCP.    Marland Kitchen Omega-3 Fatty Acids (FISH OIL) 1200 MG CAPS Take by mouth 2 (two) times daily. Reported on 11/11/2015    . ondansetron (ZOFRAN) 8 MG tablet Take 1 tablet (8 mg total) by mouth 2 (two) times daily as needed for refractory nausea / vomiting. Start on day 3 after chemo. 30 tablet 1  . aspirin 81 MG tablet Take 81 mg by mouth daily. Reported on 12/02/2015    . gabapentin (NEURONTIN) 300 MG capsule Take 1 capsule (300 mg total) by mouth 3 (three) times daily. 30 capsule 3  . LORazepam (ATIVAN) 0.5 MG tablet Take 1 tablet (0.5 mg total) by mouth at bedtime. as needed( Nausea or vomiting) (Patient not taking: Reported on 11/11/2015) 30 tablet 0  . prochlorperazine (COMPAZINE) 10 MG tablet Take 1 tablet (10 mg total) by mouth every 6 (six) hours as needed (Nausea or vomiting). (Patient not taking: Reported on 11/11/2015) 30  tablet 1  . traMADol (ULTRAM) 50 MG tablet Take 1-2 tablets (50-100 mg total) by mouth every 6 (six) hours as needed. (Patient not taking: Reported on 10/14/2015) 20 tablet 0  . traZODone (DESYREL) 50 MG tablet Take 50 mg by mouth at bedtime. Reported on 12/02/2015     No current facility-administered medications for this visit.    OBJECTIVE: Middle-aged white woman in no acute distress Filed Vitals:   12/02/15 1031  BP: 102/61  Pulse: 95  Temp: 98.3 F (36.8 C)  Resp: 18     Body mass index is 32.62 kg/(m^2).    ECOG FS:1 - Symptomatic but completely ambulatory  Skin: warm, dry  HEENT: sclerae anicteric, conjunctivae pink, oropharynx clear. No thrush or mucositis.  Lymph Nodes: No cervical or supraclavicular lymphadenopathy  Lungs: clear to auscultation bilaterally, no rales, wheezes, or rhonci  Heart: regular rate and rhythm  Abdomen: round, soft, non tender, positive bowel sounds  Musculoskeletal: No focal spinal tenderness, no peripheral edema  Neuro: non focal, well oriented, positive affect  Breasts: deferred  LAB RESULTS:  CMP     Component Value Date/Time   NA 136 11/25/2015 1009   NA 139 09/26/2015 0836   K 4.4 11/25/2015 1009   K 4.1 09/26/2015 0836   CL 105 09/26/2015 0836   CO2 24 11/25/2015 1009   CO2 28 09/26/2015 0836   GLUCOSE 199* 11/25/2015 1009   GLUCOSE 109* 09/26/2015 0836   BUN 13.9 11/25/2015 1009   BUN 15 09/26/2015 0836   CREATININE 0.8 11/25/2015 1009   CREATININE 0.82 09/26/2015 0836   CALCIUM 9.2 11/25/2015 1009   CALCIUM 9.8 09/26/2015 0836   PROT 6.6 11/25/2015 1009   ALBUMIN 3.7 11/25/2015 1009   AST 17 11/25/2015 1009   ALT 26 11/25/2015 1009   ALKPHOS 70 11/25/2015 1009   BILITOT 0.44 11/25/2015 1009   GFRNONAA >60 09/26/2015 0836   GFRAA >60 09/26/2015 0836    INo results found for: SPEP, UPEP  Lab Results  Component Value Date   WBC 9.4 12/02/2015   NEUTROABS 6.3 12/02/2015   HGB 11.3* 12/02/2015   HCT 32.9* 12/02/2015     MCV 100.5 12/02/2015   PLT 184 12/02/2015      Chemistry      Component Value Date/Time   NA 136 11/25/2015 1009   NA 139 09/26/2015 0836   K  4.4 11/25/2015 1009   K 4.1 09/26/2015 0836   CL 105 09/26/2015 0836   CO2 24 11/25/2015 1009   CO2 28 09/26/2015 0836   BUN 13.9 11/25/2015 1009   BUN 15 09/26/2015 0836   CREATININE 0.8 11/25/2015 1009   CREATININE 0.82 09/26/2015 0836      Component Value Date/Time   CALCIUM 9.2 11/25/2015 1009   CALCIUM 9.8 09/26/2015 0836   ALKPHOS 70 11/25/2015 1009   AST 17 11/25/2015 1009   ALT 26 11/25/2015 1009   BILITOT 0.44 11/25/2015 1009       No results found for: LABCA2  No components found for: LABCA125  No results for input(s): INR in the last 168 hours.  Urinalysis    Component Value Date/Time   LABSPEC 1.005 10/14/2015 0933   PHURINE 6.0 10/14/2015 0933   GLUCOSEU Negative 10/14/2015 0933   HGBUR Negative 10/14/2015 0933   BILIRUBINUR Negative 10/14/2015 0933   KETONESUR Negative 10/14/2015 0933   PROTEINUR Negative 10/14/2015 0933   UROBILINOGEN 0.2 10/14/2015 0933   NITRITE Negative 10/14/2015 0933   LEUKOCYTESUR Negative 10/14/2015 0933    STUDIES: No results found.  ASSESSMENT: 55 y.o. status post right breast biopsy 08/19/2015 for a clinical T1b N0, stage IA invasive ductal carcinoma, estrogen and progesterone receptor positive, HER-2 not amplified, with an MIB-1 of 5%  (1) status post right lumpectomy and sentinel lymph node sampling 09/01/2015 for a pT1c pN0, stage IA invasive ductal carcinoma, grade 2, with negative margins  (2) Oncotype DX score of 23 falls in the intermediate range, and predicts a risk of recurrence outside the breast within 10 years of 15%, if the patient's only systemic therapy is tamoxifen for 5 years. It also predicts an absolute risk reduction of 4% with added chemotherapy  (3) cyclophosphamide and docetaxel x 4, given every 21 days with neulasta onpro to start 10/06/15  (4)  adjuvant radiation to follow  (5) anti-estrogens to follow radiation  PLAN: Tracy Hart had a successful 3rd treatment. She managed her symptoms well. The labs were reviewed in detail. Her ANC is actually normal this week and she is just borderline anemic with an hgb of 11.3  We discussed her hot flashes. Because they typically bother her at night were are going to try 37m gabapentin QHS. She understands the most frequent side effect of this medication is going to be drowsiness but this should work in her favor since she is headed to bed anyway.  I have placed a referral to Dr. MIda Rogueoffice for a radiation consultation visit.   BJacqlyn Larsenwill return in 2 weeks for her 4th and final cycle of cyclophosphamide and docetaxel. She understands and agrees with this plan. She knows the goal of treatment in her case is cure. She has been encouraged to call with any issues that might arise before her next visit here.   HLaurie Panda NP   12/02/2015 10:54 AM

## 2015-12-16 ENCOUNTER — Encounter: Payer: Self-pay | Admitting: Nurse Practitioner

## 2015-12-16 ENCOUNTER — Other Ambulatory Visit (HOSPITAL_BASED_OUTPATIENT_CLINIC_OR_DEPARTMENT_OTHER): Payer: BLUE CROSS/BLUE SHIELD

## 2015-12-16 ENCOUNTER — Ambulatory Visit (HOSPITAL_BASED_OUTPATIENT_CLINIC_OR_DEPARTMENT_OTHER): Payer: BLUE CROSS/BLUE SHIELD

## 2015-12-16 ENCOUNTER — Ambulatory Visit (HOSPITAL_BASED_OUTPATIENT_CLINIC_OR_DEPARTMENT_OTHER): Payer: BLUE CROSS/BLUE SHIELD | Admitting: Nurse Practitioner

## 2015-12-16 VITALS — BP 123/71 | HR 78 | Temp 98.2°F | Resp 16 | Wt 219.1 lb

## 2015-12-16 DIAGNOSIS — C50411 Malignant neoplasm of upper-outer quadrant of right female breast: Secondary | ICD-10-CM

## 2015-12-16 DIAGNOSIS — Z5189 Encounter for other specified aftercare: Secondary | ICD-10-CM | POA: Diagnosis not present

## 2015-12-16 DIAGNOSIS — Z5111 Encounter for antineoplastic chemotherapy: Secondary | ICD-10-CM | POA: Diagnosis not present

## 2015-12-16 LAB — COMPREHENSIVE METABOLIC PANEL
ALBUMIN: 3.7 g/dL (ref 3.5–5.0)
ALK PHOS: 72 U/L (ref 40–150)
ALT: 31 U/L (ref 0–55)
ANION GAP: 8 meq/L (ref 3–11)
AST: 16 U/L (ref 5–34)
BILIRUBIN TOTAL: 0.36 mg/dL (ref 0.20–1.20)
BUN: 15.8 mg/dL (ref 7.0–26.0)
CALCIUM: 10 mg/dL (ref 8.4–10.4)
CO2: 24 mEq/L (ref 22–29)
Chloride: 102 mEq/L (ref 98–109)
Creatinine: 0.7 mg/dL (ref 0.6–1.1)
GLUCOSE: 153 mg/dL — AB (ref 70–140)
POTASSIUM: 4.4 meq/L (ref 3.5–5.1)
SODIUM: 134 meq/L — AB (ref 136–145)
Total Protein: 6.5 g/dL (ref 6.4–8.3)

## 2015-12-16 LAB — CBC WITH DIFFERENTIAL/PLATELET
BASO%: 0.3 % (ref 0.0–2.0)
BASOS ABS: 0.1 10*3/uL (ref 0.0–0.1)
EOS ABS: 0 10*3/uL (ref 0.0–0.5)
EOS%: 0 % (ref 0.0–7.0)
HCT: 32.7 % — ABNORMAL LOW (ref 34.8–46.6)
HEMOGLOBIN: 10.9 g/dL — AB (ref 11.6–15.9)
LYMPH#: 2 10*3/uL (ref 0.9–3.3)
LYMPH%: 10.8 % — ABNORMAL LOW (ref 14.0–49.7)
MCH: 33.9 pg (ref 25.1–34.0)
MCHC: 33.4 g/dL (ref 31.5–36.0)
MCV: 101.5 fL — AB (ref 79.5–101.0)
MONO#: 1.1 10*3/uL — AB (ref 0.1–0.9)
MONO%: 6 % (ref 0.0–14.0)
NEUT#: 15.4 10*3/uL — ABNORMAL HIGH (ref 1.5–6.5)
NEUT%: 82.9 % — ABNORMAL HIGH (ref 38.4–76.8)
PLATELETS: 297 10*3/uL (ref 145–400)
RBC: 3.22 10*6/uL — ABNORMAL LOW (ref 3.70–5.45)
RDW: 16.9 % — AB (ref 11.2–14.5)
WBC: 18.5 10*3/uL — AB (ref 3.9–10.3)

## 2015-12-16 MED ORDER — SODIUM CHLORIDE 0.9% FLUSH
10.0000 mL | INTRAVENOUS | Status: DC | PRN
  Administered 2015-12-16: 10 mL
  Filled 2015-12-16: qty 10

## 2015-12-16 MED ORDER — SODIUM CHLORIDE 0.9 % IV SOLN
600.0000 mg/m2 | Freq: Once | INTRAVENOUS | Status: AC
  Administered 2015-12-16: 1300 mg via INTRAVENOUS
  Filled 2015-12-16: qty 65

## 2015-12-16 MED ORDER — PALONOSETRON HCL INJECTION 0.25 MG/5ML
0.2500 mg | Freq: Once | INTRAVENOUS | Status: AC
  Administered 2015-12-16: 0.25 mg via INTRAVENOUS

## 2015-12-16 MED ORDER — PALONOSETRON HCL INJECTION 0.25 MG/5ML
INTRAVENOUS | Status: AC
  Filled 2015-12-16: qty 5

## 2015-12-16 MED ORDER — PEGFILGRASTIM 6 MG/0.6ML ~~LOC~~ PSKT
6.0000 mg | PREFILLED_SYRINGE | Freq: Once | SUBCUTANEOUS | Status: AC
  Administered 2015-12-16: 6 mg via SUBCUTANEOUS
  Filled 2015-12-16: qty 0.6

## 2015-12-16 MED ORDER — HEPARIN SOD (PORK) LOCK FLUSH 100 UNIT/ML IV SOLN
500.0000 [IU] | Freq: Once | INTRAVENOUS | Status: AC | PRN
  Administered 2015-12-16: 500 [IU]
  Filled 2015-12-16: qty 5

## 2015-12-16 MED ORDER — SODIUM CHLORIDE 0.9 % IV SOLN
10.0000 mg | Freq: Once | INTRAVENOUS | Status: AC
  Administered 2015-12-16: 10 mg via INTRAVENOUS
  Filled 2015-12-16: qty 1

## 2015-12-16 MED ORDER — SODIUM CHLORIDE 0.9 % IV SOLN
Freq: Once | INTRAVENOUS | Status: AC
  Administered 2015-12-16: 13:00:00 via INTRAVENOUS

## 2015-12-16 MED ORDER — SODIUM CHLORIDE 0.9 % IV SOLN
75.0000 mg/m2 | Freq: Once | INTRAVENOUS | Status: AC
  Administered 2015-12-16: 160 mg via INTRAVENOUS
  Filled 2015-12-16: qty 16

## 2015-12-16 NOTE — Progress Notes (Signed)
[facility]  [phone] [phone]   ID: Tracy Hart DOB: 1961-06-27  MR#: [id number]  UYQ#:[id number]  Patient Care Team: Tracy Solian, MD as PCP - General (Internal Medicine) Tracy Gilmore [name], MD as Consulting Physician (Obstetrics and Gynecology) Tracy Cruel, MD as Consulting Physician (Oncology) Tracy Keens, MD as Consulting Physician (General Surgery) Tracy Rudd, MD as Consulting Physician (Radiation Oncology) PCP: Tracy Ringer, MD OTHER MD:  CHIEF COMPLAINT: Estrogen receptor positive breast cancer  CURRENT TREATMENT: Adjuvant treatment pending  BREAST CANCER HISTORY: From the original intake note:  "Tracy Hart" had routine screening mammography at Dr. [name] office late November, showing a possible mass in the right breast. On 08/04/2015 she underwent right diagnostic mammography with tomosynthesis and right breast ultrasonography at the breast Center. The breast density was category C. In the upper outer quadrant of the right breast there was a 0.8 cm area of asymmetry which was not palpable by exam. Ultrasound showed no suspicious mass or calcifications.  Biopsy of the area of asymmetry was performed 08/19/2015, and showed ([id number]) invasive and in situ ductal carcinoma, E-cadherin positive, estrogen receptor 90% positive, progesterone receptor 90% positive, both with strong staining intensity, with an MIB-1 of 5%, and no HER-2 amplification, the signals ratio being 1.44 and the number per cell 1.80.  The patient was then referred to surgery and after appropriate discussion underwent right lumpectomy and sentinel lymph node sampling 09/01/2015. The pathology from this procedure ([id number]) confirmed an invasive ductal carcinoma, grade 2, measuring 1.7 cm, with some low-grade ductal carcinoma in situ. Margins were negative. All 5 sentinel lymph nodes were clear.  The patient's subsequent history is as detailed below.  INTERVAL  HISTORY: Tracy Hart returns today for follow-up of her estrogen receptor positive breast cancer, accompanied by her husband, Tracy Hart. Today is day 1, cycle 4 of cyclophosphamide and docetaxel, with neulasta for granulocyte support.  REVIEW OF SYSTEMS: Tracy Hart denies fevers, chills ,nausea, or vomiting. Her bowels are normalizing. She denies mouth sores but has some mouth discomfort with spicy foods. Her appetite is healthy with occasional taste changes. Her hot flashes have improved since starting on gabapentin QHS. She is sleeping well. She is in no pain today, but does manage her bone pain after neulasta with claritin. A detailed review of systems is otherwise stable.  PAST MEDICAL HISTORY: Past Medical History  Diagnosis Date  . Cancer Tracy Hart) 2016    right breast  . Breast cancer ([facility]) 08/19/15    right breast  . Anxiety     takes Xanax daily as needed  . Hypertension     takes Benicar daily  . Hypothyroidism     takes Synthroid daily  . Depression     takes Lexapro daily  . Seasonal allergies     takes Zyrtec and uses Flonase daily  . Hyperlipidemia     takes Atorvastatin daily  . History of bronchitis 2000  . History of hiatal hernia   . GERD (gastroesophageal reflux disease)   . Insomnia     takes Trazodone nightly    PAST SURGICAL HISTORY: Past Surgical History  Procedure Laterality Date  . Breast lumpectomy with radioactive seed and sentinel lymph node biopsy Right 09/01/2015    Procedure: RIGHT BREAST LUMPECTOMY WITH RADIOACTIVE SEED LOCALIZED AND SENTINEL LYMPH NODE BIOPSY;  Surgeon: Tracy Keens, MD;  Location: [facility];  Service: General;  Laterality: Right;  . Tonsillectomy  at age 16  . Hernia repair Right 2009  .  Esophagogastroduodenoscopy    . Colonoscopy    . Portacath placement Left 09/29/2015    Procedure: INSERTION PORT-A-CATH;  Surgeon: Tracy Keens, MD;  Location: [facility] OR;  Service: General;  Laterality: Left;    FAMILY HISTORY Family History    Problem Relation Age of Onset  . Cancer Mother 37    breast  . Diabetes Mother   . Hypertension Mother   . Hypertension Father   . Cancer Sister 25    thyroid  . Seizures Sister    The patient's parents are in their early 1s as of January 2017. The patient had no brothers, 3 sisters. The patient's mother was diagnosed with breast cancer at age 48. The patient's sister was diagnosed with thyroid cancer at age 60. There is no history of ovarian cancer in the family.   GYNECOLOGIC HISTORY:  No LMP recorded. Patient is postmenopausal. Menarche age 37, the patient is GX P0. She was on oral contraceptives between 1996 and 2016, and at the time of her diagnosis had been on hormone replacement for 2 months.   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 Her husband Tracy Hart") works in Occupational hygienist for a Technical sales engineer. At home is just the 2 of them, with no pets. The patient at tends [place]    ADVANCED DIRECTIVES: In place. The patient's husband is her healthcare power of attorney with her sister Tracy Hart listed as secondary   HEALTH MAINTENANCE: Social History  Substance Use Topics  . Smoking status: Former Research scientist (life sciences)  . Smokeless tobacco: Not on file     Comment: quit smoking around 1990  . Alcohol Use: No     Colonoscopy: 2010/[facility]  PAP: November 2016  Bone density: Due/ at Dr [name]  Lipid panel:  Allergies  Allergen Reactions  . Codeine     No percocet, makes patient weird  . Sulfa Antibiotics Other (See Comments)    Caused mouth sores    Current Outpatient Prescriptions  Medication Sig Dispense Refill  . ALPRAZolam (XANAX) 0.5 MG tablet Take 0.25 mg by mouth at bedtime as needed for anxiety. Reported on 11/25/2015    . aspirin 81 MG tablet Take 81 mg by mouth daily. Reported on 12/02/2015    . atorvastatin (LIPITOR) 40 MG tablet Take 40 mg by mouth daily.    . calcium carbonate (OS-CAL) 600 MG TABS  tablet Take 600 mg by mouth 2 (two) times daily with a meal. Reported on 10/03/2015    . cetirizine (ZYRTEC) 10 MG tablet Take 10 mg by mouth daily.    . cholecalciferol (VITAMIN D) 400 UNITS TABS tablet Take 800 Units by mouth 2 (two) times daily.    Marland Kitchen dexamethasone (DECADRON) 4 MG tablet Take 2 tablets (8 mg total) by mouth 2 (two) times daily. Start the day before Taxotere. Then again the day after chemo for 3 days. 30 tablet 1  . escitalopram (LEXAPRO) 10 MG tablet Take 10 mg by mouth daily.    . fluticasone (FLONASE) 50 MCG/ACT nasal spray Place 1 spray into both nostrils daily. Reported on 11/25/2015    . gabapentin (NEURONTIN) 300 MG capsule Take 1 capsule (300 mg total) by mouth 3 (three) times daily. 30 capsule 3  . levothyroxine (SYNTHROID, LEVOTHROID) 137 MCG tablet Take 137 mcg by mouth daily before breakfast.    . lidocaine-prilocaine (EMLA) cream Apply 1 application topically as needed. 30 g 3  . olmesartan-hydrochlorothiazide (BENICAR  HCT) 40-25 MG per tablet Take 1 tablet by mouth daily. Pt is taking 1/2 tablet daily instructed by PCP.    Marland Kitchen Omega-3 Fatty Acids (FISH OIL) 1200 MG CAPS Take by mouth 2 (two) times daily. Reported on 11/11/2015    . ondansetron (ZOFRAN) 8 MG tablet Take 1 tablet (8 mg total) by mouth 2 (two) times daily as needed for refractory nausea / vomiting. Start on day 3 after chemo. 30 tablet 1  . traZODone (DESYREL) 50 MG tablet Take 50 mg by mouth at bedtime. Reported on 12/02/2015    . doxycycline (VIBRA-TABS) 100 MG tablet Take 1 tablet (100 mg total) by mouth 2 (two) times daily. (Patient not taking: Reported on 12/16/2015) 30 tablet 1  . LORazepam (ATIVAN) 0.5 MG tablet Take 1 tablet (0.5 mg total) by mouth at bedtime. as needed( Nausea or vomiting) (Patient not taking: Reported on 11/11/2015) 30 tablet 0  . prochlorperazine (COMPAZINE) 10 MG tablet Take 1 tablet (10 mg total) by mouth every 6 (six) hours as needed (Nausea or vomiting). (Patient not taking: Reported  on 11/11/2015) 30 tablet 1  . traMADol (ULTRAM) 50 MG tablet Take 1-2 tablets (50-100 mg total) by mouth every 6 (six) hours as needed. (Patient not taking: Reported on 10/14/2015) 20 tablet 0   No current facility-administered medications for this visit.   Facility-Administered Medications Ordered in Other Visits  Medication Dose Route Frequency Provider Last Rate Last Dose  . 0.9 %  sodium chloride infusion   Intravenous Once Tracy Cruel, MD      . dexamethasone (DECADRON) 10 mg in sodium chloride 0.9 % 50 mL IVPB  10 mg Intravenous Once Tracy Cruel, MD      . heparin lock flush 100 unit/mL  500 Units Intracatheter Once PRN Tracy Cruel, MD      . palonosetron (ALOXI) injection 0.25 mg  0.25 mg Intravenous Once Tracy Cruel, MD      . sodium chloride flush (NS) 0.9 % injection 10 mL  10 mL Intracatheter PRN Tracy Cruel, MD        OBJECTIVE: Middle-aged white woman in no acute distress Filed Vitals:   12/16/15 1125  BP: 123/71  Pulse: 78  Temp: 98.2 F (36.8 C)  Resp: 16     Body mass index is 33.32 kg/(m^2).    ECOG FS:1 - Symptomatic but completely ambulatory  Skin: warm, dry  HEENT: sclerae anicteric, conjunctivae pink, oropharynx clear. No thrush or mucositis.  Lymph Nodes: No cervical or supraclavicular lymphadenopathy  Lungs: clear to auscultation bilaterally, no rales, wheezes, or rhonci  Heart: regular rate and rhythm  Abdomen: round, soft, non tender, positive bowel sounds  Musculoskeletal: No focal spinal tenderness, no peripheral edema  Neuro: non focal, well oriented, positive affect  Breasts: deferred  LAB RESULTS:  CMP     Component Value Date/Time   NA 134* 12/16/2015 1059   NA 139 09/26/2015 0836   K 4.4 12/16/2015 1059   K 4.1 09/26/2015 0836   CL 105 09/26/2015 0836   CO2 24 12/16/2015 1059   CO2 28 09/26/2015 0836   GLUCOSE 153* 12/16/2015 1059   GLUCOSE 109* 09/26/2015 0836   BUN 15.8 12/16/2015 1059   BUN 15 09/26/2015  0836   CREATININE 0.7 12/16/2015 1059   CREATININE 0.82 09/26/2015 0836   CALCIUM 10.0 12/16/2015 1059   CALCIUM 9.8 09/26/2015 0836   PROT 6.5 12/16/2015 1059   ALBUMIN 3.7 12/16/2015 1059   AST  16 12/16/2015 1059   ALT 31 12/16/2015 1059   ALKPHOS 72 12/16/2015 1059   BILITOT 0.36 12/16/2015 1059   GFRNONAA >60 09/26/2015 0836   GFRAA >60 09/26/2015 0836    INo results found for: SPEP, UPEP  Lab Results  Component Value Date   WBC 18.5* 12/16/2015   NEUTROABS 15.4* 12/16/2015   HGB 10.9* 12/16/2015   HCT 32.7* 12/16/2015   MCV 101.5* 12/16/2015   PLT 297 12/16/2015      Chemistry      Component Value Date/Time   NA 134* 12/16/2015 1059   NA 139 09/26/2015 0836   K 4.4 12/16/2015 1059   K 4.1 09/26/2015 0836   CL 105 09/26/2015 0836   CO2 24 12/16/2015 1059   CO2 28 09/26/2015 0836   BUN 15.8 12/16/2015 1059   BUN 15 09/26/2015 0836   CREATININE 0.7 12/16/2015 1059   CREATININE 0.82 09/26/2015 0836      Component Value Date/Time   CALCIUM 10.0 12/16/2015 1059   CALCIUM 9.8 09/26/2015 0836   ALKPHOS 72 12/16/2015 1059   AST 16 12/16/2015 1059   ALT 31 12/16/2015 1059   BILITOT 0.36 12/16/2015 1059       No results found for: LABCA2  No components found for: IAXKP537  No results for input(s): INR in the last 168 hours.  Urinalysis    Component Value Date/Time   LABSPEC 1.005 10/14/2015 0933   PHURINE 6.0 10/14/2015 0933   GLUCOSEU Negative 10/14/2015 0933   HGBUR Negative 10/14/2015 0933   BILIRUBINUR Negative 10/14/2015 0933   KETONESUR Negative 10/14/2015 0933   PROTEINUR Negative 10/14/2015 0933   UROBILINOGEN 0.2 10/14/2015 0933   NITRITE Negative 10/14/2015 0933   LEUKOCYTESUR Negative 10/14/2015 0933    STUDIES: No results found.  ASSESSMENT: 55 y.o. status post right breast biopsy 08/19/2015 for a clinical T1b N0, stage IA invasive ductal carcinoma, estrogen and progesterone receptor positive, HER-2 not amplified, with an MIB-1 of  5%  (1) status post right lumpectomy and sentinel lymph node sampling 09/01/2015 for a pT1c pN0, stage IA invasive ductal carcinoma, grade 2, with negative margins  (2) Oncotype DX score of 23 falls in the intermediate range, and predicts a risk of recurrence outside the breast within 10 years of 15%, if the patient's only systemic therapy is tamoxifen for 5 years. It also predicts an absolute risk reduction of 4% with added chemotherapy  (3) cyclophosphamide and docetaxel x 4, given every 21 days with neulasta onpro to start 10/06/15  (4) adjuvant radiation to follow  (5) anti-estrogens to follow radiation  PLAN: Tracy Hart is excited to be completing chemotherapy today. The labs were reviewed in detail and were sufficient for treatment. She will proceed with her 4th and final cycle of docetaxel and cyclophosphamide as planned today.   She is already scheduled for follow up with Dr. Lisbeth Renshaw next month for her radiation consultation visit.  Tracy Hart will return in 1 week for labs and a nadir visit with Dr. Jana Hakim. She understands and agrees with this plan. She knows the goal of treatment in her case is cure. She has been encouraged to call with any issues that might arise before her next visit here.   Laurie Panda, NP   12/16/2015 12:28 PM

## 2015-12-16 NOTE — Patient Instructions (Signed)
[facility] Discharge Instructions for Patients Receiving Chemotherapy  Today you received the following chemotherapy agents:  Taxotere, Cytoxan  To help prevent nausea and vomiting after your treatment, we encourage you to take your nausea medication as prescribed.   If you develop nausea and vomiting that is not controlled by your nausea medication, call the clinic.   BELOW ARE SYMPTOMS THAT SHOULD BE REPORTED IMMEDIATELY:  *FEVER GREATER THAN 100.5 F  *CHILLS WITH OR WITHOUT FEVER  NAUSEA AND VOMITING THAT IS NOT CONTROLLED WITH YOUR NAUSEA MEDICATION  *UNUSUAL SHORTNESS OF BREATH  *UNUSUAL BRUISING OR BLEEDING  TENDERNESS IN MOUTH AND THROAT WITH OR WITHOUT PRESENCE OF ULCERS  *URINARY PROBLEMS  *BOWEL PROBLEMS  UNUSUAL RASH Items with * indicate a potential emergency and should be followed up as soon as possible.  Feel free to call the clinic you have any questions or concerns. The clinic phone number is [phone].  Please show the CHEMO ALERT CARD at check-in to the Emergency Department and triage nurse.   

## 2015-12-17 ENCOUNTER — Encounter: Payer: Self-pay | Admitting: *Deleted

## 2015-12-24 ENCOUNTER — Ambulatory Visit (HOSPITAL_BASED_OUTPATIENT_CLINIC_OR_DEPARTMENT_OTHER): Payer: BLUE CROSS/BLUE SHIELD | Admitting: Oncology

## 2015-12-24 ENCOUNTER — Other Ambulatory Visit (HOSPITAL_BASED_OUTPATIENT_CLINIC_OR_DEPARTMENT_OTHER): Payer: BLUE CROSS/BLUE SHIELD

## 2015-12-24 ENCOUNTER — Telehealth: Payer: Self-pay | Admitting: Oncology

## 2015-12-24 VITALS — BP 108/59 | HR 89 | Temp 98.0°F | Resp 18 | Ht 68.0 in | Wt 215.2 lb

## 2015-12-24 DIAGNOSIS — C50411 Malignant neoplasm of upper-outer quadrant of right female breast: Secondary | ICD-10-CM

## 2015-12-24 LAB — CBC WITH DIFFERENTIAL/PLATELET
BASO%: 0.5 % (ref 0.0–2.0)
Basophils Absolute: 0.1 10*3/uL (ref 0.0–0.1)
EOS ABS: 0 10*3/uL (ref 0.0–0.5)
EOS%: 0.2 % (ref 0.0–7.0)
HCT: 32.2 % — ABNORMAL LOW (ref 34.8–46.6)
HGB: 10.8 g/dL — ABNORMAL LOW (ref 11.6–15.9)
LYMPH#: 2.6 10*3/uL (ref 0.9–3.3)
LYMPH%: 15.5 % (ref 14.0–49.7)
MCH: 34.6 pg — ABNORMAL HIGH (ref 25.1–34.0)
MCHC: 33.5 g/dL (ref 31.5–36.0)
MCV: 103.2 fL — ABNORMAL HIGH (ref 79.5–101.0)
MONO#: 1.7 10*3/uL — AB (ref 0.1–0.9)
MONO%: 10.5 % (ref 0.0–14.0)
NEUT%: 73.3 % (ref 38.4–76.8)
NEUTROS ABS: 12 10*3/uL — AB (ref 1.5–6.5)
Platelets: 212 10*3/uL (ref 145–400)
RBC: 3.12 10*6/uL — AB (ref 3.70–5.45)
RDW: 16.3 % — AB (ref 11.2–14.5)
WBC: 16.4 10*3/uL — AB (ref 3.9–10.3)

## 2015-12-24 LAB — COMPREHENSIVE METABOLIC PANEL
ALT: 27 U/L (ref 0–55)
AST: 19 U/L (ref 5–34)
Albumin: 3.5 g/dL (ref 3.5–5.0)
Alkaline Phosphatase: 90 U/L (ref 40–150)
Anion Gap: 8 mEq/L (ref 3–11)
BUN: 10.5 mg/dL (ref 7.0–26.0)
CHLORIDE: 104 meq/L (ref 98–109)
CO2: 25 meq/L (ref 22–29)
CREATININE: 0.8 mg/dL (ref 0.6–1.1)
Calcium: 9.1 mg/dL (ref 8.4–10.4)
EGFR: 87 mL/min/{1.73_m2} — ABNORMAL LOW (ref >90)
GLUCOSE: 106 mg/dL (ref 70–140)
Potassium: 4.5 mEq/L (ref 3.5–5.1)
SODIUM: 137 meq/L (ref 136–145)
TOTAL PROTEIN: 5.8 g/dL — AB (ref 6.4–8.3)

## 2015-12-24 NOTE — Progress Notes (Signed)
Tracy Hart  [phone] [phone]   ID: Tracy Hart DOB: 1961/01/14  MR#: [id number]  NGE#:[id number]  Patient Care Team: Prince Solian, MD as PCP - General (Internal Medicine) [name] [name], MD as Consulting Physician (Obstetrics and Gynecology) [name], MD as Consulting Physician (Oncology) [name], MD as Consulting Physician (General Surgery) [name], MD as Consulting Physician (Radiation Oncology) PCP: [name], MD OTHER MD:  CHIEF COMPLAINT: Estrogen receptor positive breast cancer  CURRENT TREATMENT: Adjuvant radiation to follow  BREAST CANCER HISTORY: From the original intake note:  "Tracy Hart" had routine screening mammography at Dr. [name] office late November, showing a possible mass in the right breast. On 08/04/2015 she underwent right diagnostic mammography with tomosynthesis and right breast ultrasonography at the breast Center. The breast density was category C. In the upper outer quadrant of the right breast there was a 0.8 cm area of asymmetry which was not palpable by exam. Ultrasound showed no suspicious mass or calcifications.  Biopsy of the area of asymmetry was performed 08/19/2015, and showed ([id number]) invasive and in situ ductal carcinoma, E-cadherin positive, estrogen receptor 90% positive, progesterone receptor 90% positive, both with strong staining intensity, with an MIB-1 of 5%, and no HER-2 amplification, the signals ratio being 1.44 and the number per cell 1.80.  The patient was then referred to surgery and after appropriate discussion underwent right lumpectomy and sentinel lymph node sampling 09/01/2015. The pathology from this procedure ([id number]) confirmed an invasive ductal carcinoma, grade 2, measuring 1.7 cm, with some low-grade ductal carcinoma in situ. Margins were negative. All 5 sentinel lymph nodes were clear.  The patient's subsequent history is as detailed  below.  INTERVAL HISTORY: Tracy Hart returns today for follow-up of her breast cancer. Today is day 9, cycle 4 of 4 planned cycles of cyclophosphamide and docetaxel, with neulasta for granulocyte support.  REVIEW OF SYSTEMS: Tracy Hart did generally quite well with her treatment. The first one was the worst. She did not receive a booster shot because her counts were ready high to start with but she did have a very low white blood cell count and and she felt that associated with that. She also had more bony pain despite no booster shot on that first round. After that she took the Aleve and Claritin as prescribed and did much better. She tried to stay away from working on her nails but still manages to get a couple of nail infections. She didn't have mouth sores but certainly did have mild discomfort and altered taste. She felt weak usually around days 5 and 6 from each treatment but otherwise has been able to pretty much keep up with her usual activities and is now trying to exercise up to 30 minutes a day. A detailed review of systems was otherwise stable  PAST MEDICAL HISTORY: Past Medical History  Diagnosis Date  . Cancer [facility]) 2016    right breast  . Breast cancer ([place]) 08/19/15    right breast  . Anxiety     takes Xanax daily as needed  . Hypertension     takes Benicar daily  . Hypothyroidism     takes Synthroid daily  . Depression     takes Lexapro daily  . Seasonal allergies     takes Zyrtec and uses Flonase daily  . Hyperlipidemia     takes Atorvastatin daily  . History of bronchitis 2000  . History of hiatal hernia   . GERD (gastroesophageal reflux disease)   .  Insomnia     takes Trazodone nightly    PAST SURGICAL HISTORY: Past Surgical History  Procedure Laterality Date  . Breast lumpectomy with radioactive seed and sentinel lymph node biopsy Right 09/01/2015    Procedure: RIGHT BREAST LUMPECTOMY WITH RADIOACTIVE SEED LOCALIZED AND SENTINEL LYMPH NODE BIOPSY;  Surgeon: [name], MD;  Location: [place];  Service: General;  Laterality: Right;  . Tonsillectomy  at age 73  . Hernia repair Right 2009  . Esophagogastroduodenoscopy    . Colonoscopy    . Portacath placement Left 09/29/2015    Procedure: INSERTION PORT-A-CATH;  Surgeon: [name], MD;  Location: [facility] OR;  Service: General;  Laterality: Left;    FAMILY HISTORY Family History  Problem Relation Age of Onset  . Cancer Mother 13    breast  . Diabetes Mother   . Hypertension Mother   . Hypertension Father   . Cancer Sister 86    thyroid  . Seizures Sister    The patient's parents are in their early 46s as of January 2017. The patient had no brothers, 3 sisters. The patient's mother was diagnosed with breast cancer at age 34. The patient's sister was diagnosed with thyroid cancer at age 52. There is no history of ovarian cancer in the family.   GYNECOLOGIC HISTORY:  No LMP recorded. Patient is postmenopausal. Menarche age 52, the patient is GX P0. She was on oral contraceptives between 1996 and 2016, and at the time of her diagnosis had been on hormone replacement for 2 months.   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 Her husband Tracy Hart") works in Occupational hygienist for a Technical sales engineer. At home is just the 2 of them, with no pets. The patient at tends [place]    ADVANCED DIRECTIVES: In place. The patient's husband is her healthcare power of attorney with her sister Tracy Hart listed as secondary   HEALTH MAINTENANCE: Social History  Substance Use Topics  . Smoking status: Former Research scientist (life sciences)  . Smokeless tobacco: Not on file     Comment: quit smoking around 1990  . Alcohol Use: No     Colonoscopy: 2010/[facility]  PAP: November 2016  Bone density: Due/ at Dr [name]  Lipid panel:  Allergies  Allergen Reactions  . Codeine     No percocet, makes patient weird  . Sulfa Antibiotics Other (See Comments)     Caused mouth sores    Current Outpatient Prescriptions  Medication Sig Dispense Refill  . ALPRAZolam (XANAX) 0.5 MG tablet Take 0.25 mg by mouth at bedtime as needed for anxiety. Reported on 11/25/2015    . aspirin 81 MG tablet Take 81 mg by mouth daily. Reported on 12/02/2015    . atorvastatin (LIPITOR) 40 MG tablet Take 40 mg by mouth daily.    . calcium carbonate (OS-CAL) 600 MG TABS tablet Take 600 mg by mouth 2 (two) times daily with a meal. Reported on 10/03/2015    . cetirizine (ZYRTEC) 10 MG tablet Take 10 mg by mouth daily.    . cholecalciferol (VITAMIN D) 400 UNITS TABS tablet Take 800 Units by mouth 2 (two) times daily.    Marland Kitchen dexamethasone (DECADRON) 4 MG tablet Take 2 tablets (8 mg total) by mouth 2 (two) times daily. Start the day before Taxotere. Then again the day after chemo for 3 days. 30 tablet 1  . doxycycline (VIBRA-TABS) 100 MG tablet Take 1 tablet (100  mg total) by mouth 2 (two) times daily. (Patient not taking: Reported on 12/16/2015) 30 tablet 1  . escitalopram (LEXAPRO) 10 MG tablet Take 10 mg by mouth daily.    . fluticasone (FLONASE) 50 MCG/ACT nasal spray Place 1 spray into both nostrils daily. Reported on 11/25/2015    . gabapentin (NEURONTIN) 300 MG capsule Take 1 capsule (300 mg total) by mouth 3 (three) times daily. 30 capsule 3  . levothyroxine (SYNTHROID, LEVOTHROID) 137 MCG tablet Take 137 mcg by mouth daily before breakfast.    . lidocaine-prilocaine (EMLA) cream Apply 1 application topically as needed. 30 g 3  . LORazepam (ATIVAN) 0.5 MG tablet Take 1 tablet (0.5 mg total) by mouth at bedtime. as needed( Nausea or vomiting) (Patient not taking: Reported on 11/11/2015) 30 tablet 0  . olmesartan-hydrochlorothiazide (BENICAR HCT) 40-25 MG per tablet Take 1 tablet by mouth daily. Pt is taking 1/2 tablet daily instructed by PCP.    Marland Kitchen Omega-3 Fatty Acids (FISH OIL) 1200 MG CAPS Take by mouth 2 (two) times daily. Reported on 11/11/2015    . ondansetron (ZOFRAN) 8 MG  tablet Take 1 tablet (8 mg total) by mouth 2 (two) times daily as needed for refractory nausea / vomiting. Start on day 3 after chemo. 30 tablet 1  . prochlorperazine (COMPAZINE) 10 MG tablet Take 1 tablet (10 mg total) by mouth every 6 (six) hours as needed (Nausea or vomiting). (Patient not taking: Reported on 11/11/2015) 30 tablet 1  . traMADol (ULTRAM) 50 MG tablet Take 1-2 tablets (50-100 mg total) by mouth every 6 (six) hours as needed. (Patient not taking: Reported on 10/14/2015) 20 tablet 0  . traZODone (DESYREL) 50 MG tablet Take 50 mg by mouth at bedtime. Reported on 12/02/2015     No current facility-administered medications for this visit.    OBJECTIVE: Middle-aged white woman who appears stated age  79 Vitals:   12/24/15 1016  BP: 108/59  Pulse: 89  Temp: 98 F (36.7 C)  Resp: 18     Body mass index is 32.73 kg/(m^2).    ECOG FS:1 - Symptomatic but completely ambulatory  Sclerae unicteric, pupils round and equal Oropharynx clear and moist-- no thrush or other lesions No cervical or supraclavicular adenopathy Lungs no rales or rhonchi Heart regular rate and rhythm Abd soft, obese, nontender, positive bowel sounds MSK no focal spinal tenderness, no upper extremity lymphedema Neuro: nonfocal, well oriented, appropriate affect Breasts: The right breast is status post lumpectomy and sentinel lymph node sampling. The cosmetic result is excellent. There are no palpable masses. There are no skin or nipple changes of concern. Right axilla is benign. Left breast is unremarkable    LAB RESULTS:  CMP     Component Value Date/Time   NA 134* 12/16/2015 1059   NA 139 09/26/2015 0836   K 4.4 12/16/2015 1059   K 4.1 09/26/2015 0836   CL 105 09/26/2015 0836   CO2 24 12/16/2015 1059   CO2 28 09/26/2015 0836   GLUCOSE 153* 12/16/2015 1059   GLUCOSE 109* 09/26/2015 0836   BUN 15.8 12/16/2015 1059   BUN 15 09/26/2015 0836   CREATININE 0.7 12/16/2015 1059   CREATININE 0.82  09/26/2015 0836   CALCIUM 10.0 12/16/2015 1059   CALCIUM 9.8 09/26/2015 0836   PROT 6.5 12/16/2015 1059   ALBUMIN 3.7 12/16/2015 1059   AST 16 12/16/2015 1059   ALT 31 12/16/2015 1059   ALKPHOS 72 12/16/2015 1059   BILITOT 0.36 12/16/2015 1059  GFRNONAA >60 09/26/2015 0836   GFRAA >60 09/26/2015 0836    INo results found for: SPEP, UPEP  Lab Results  Component Value Date   WBC 16.4* 12/24/2015   NEUTROABS 12.0* 12/24/2015   HGB 10.8* 12/24/2015   HCT 32.2* 12/24/2015   MCV 103.2* 12/24/2015   PLT 212 12/24/2015      Chemistry      Component Value Date/Time   NA 134* 12/16/2015 1059   NA 139 09/26/2015 0836   K 4.4 12/16/2015 1059   K 4.1 09/26/2015 0836   CL 105 09/26/2015 0836   CO2 24 12/16/2015 1059   CO2 28 09/26/2015 0836   BUN 15.8 12/16/2015 1059   BUN 15 09/26/2015 0836   CREATININE 0.7 12/16/2015 1059   CREATININE 0.82 09/26/2015 0836      Component Value Date/Time   CALCIUM 10.0 12/16/2015 1059   CALCIUM 9.8 09/26/2015 0836   ALKPHOS 72 12/16/2015 1059   AST 16 12/16/2015 1059   ALT 31 12/16/2015 1059   BILITOT 0.36 12/16/2015 1059       No results found for: LABCA2  No components found for: [id number]  No results for input(s): INR in the last 168 hours.  Urinalysis    Component Value Date/Time   LABSPEC 1.005 10/14/2015 0933   PHURINE 6.0 10/14/2015 0933   GLUCOSEU Negative 10/14/2015 0933   HGBUR Negative 10/14/2015 0933   BILIRUBINUR Negative 10/14/2015 0933   KETONESUR Negative 10/14/2015 0933   PROTEINUR Negative 10/14/2015 0933   UROBILINOGEN 0.2 10/14/2015 0933   NITRITE Negative 10/14/2015 0933   LEUKOCYTESUR Negative 10/14/2015 0933    STUDIES: CLINICAL DATA: Cough and fever.  EXAM: CHEST 2 VIEW  COMPARISON: None.  FINDINGS: Prior port catheter noted with lead tip projected over the superior vena cava. Mediastinum and hilar structures are normal. Heart size normal. Mild bilateral from interstitial prominence  noted consistent mild pneumonitis. No pleural effusion or pneumothorax.  IMPRESSION: 1. Mild bilateral pulmonary interstitial prominence suggesting mild pneumonitis.  2. Power port catheter in stable position.   Electronically Signed  By: Marcello Moores Register  On: 10/21/2015 11:56  ASSESSMENT: 55 y.o. status post right breast biopsy 08/19/2015 for a clinical T1b N0, stage IA invasive ductal carcinoma, estrogen and progesterone receptor positive, HER-2 not amplified, with an MIB-1 of 5%  (1) status post right lumpectomy and sentinel lymph node sampling 09/01/2015 for a pT1c pN0, stage IA invasive ductal carcinoma, grade 2, with negative margins  (2) Oncotype DX score of 23 falls in the intermediate range, and predicts a risk of recurrence outside the breast within 10 years of 15%, if the patient's only systemic therapy is tamoxifen for 5 years. It also predicts an absolute risk reduction of 4% with added chemotherapy (11% risk)  (3) cyclophosphamide and docetaxel x 4, given every 21 days with neulasta onpro started 10/06/15, Completed 12/16/2015  (4) adjuvant radiation to follow  (5) anti-estrogens to follow radiation  PLAN: Becky did terrific with her chemotherapy. She is now ready to complete her local therapy with adjuvant radiation area that likely will take her through the end of June.  She is interested in having her port removed. Usually that happens after radiation is completed.  Today we did discuss prognosis. The Oncotype would "her a risk of 11% recurrence outside the breast within 10 years. However that is based on 5 years of tamoxifen and chemotherapy with CMF. The chemotherapy she received is superior and easily can get her one or 2 extra percent  points. The anti-estrogens she will receive likewise will be marginally better than 5 years of tamoxifen. Overall I think her actual risk of distant recurrence will be in the 8% range  She is going to see me again in mid July.  At that point we will start tamoxifen or anastrozole at her choice. She knows to call for any problems that may develop before that visit.  [name], MD   12/24/2015 10:38 AM

## 2015-12-24 NOTE — Telephone Encounter (Signed)
appt made and avs printed °

## 2016-01-06 ENCOUNTER — Other Ambulatory Visit: Payer: Self-pay | Admitting: Nurse Practitioner

## 2016-01-06 ENCOUNTER — Telehealth: Payer: Self-pay | Admitting: *Deleted

## 2016-01-06 NOTE — Telephone Encounter (Signed)
This RN returned call to pt per her message with concern of left leg swelling- per discussion with further inquiry- Tracy Hart states she has has swelling in both legs " which now is much better then over the weekend " as well as " my arms have also felt swollen but seem to be ok now " She has noticed some neuropathy in bilateral toes and thumbs " just seems random "  Tracy Hart can flex and point toes with out discomfort, she denies any focal point of pain nor redness. Discomfort " is more just when it was swollen more and felt kinda tight "

## 2016-01-06 NOTE — Progress Notes (Addendum)
Progress Notes by Doreen Beam, RN at 09/25/2015 8:36 AM                   Expand All Collapse All   Location of Breast Cancer: Right Breast Upper Outer Quadrant   Histology per Pathology Report: Diagnosis 08/19/15: initial dx: Breast, right, needle core biopsy, upper outer quadrant - INVASIVE AND IN SITU MAMMARY CARCINOMA.  Receptor Status: ER(90%+), PR (90%+), Her2-neu (neg KI-67 5%)  Did patient present with symptoms (if so, please note symptoms) or was this found on screening mammography?: screening mammogram  Past/Anticipated interventions by surgeon, if OQU:CLTVTWYSO 09/01/2015: Dr. Harvie Bridge 1. Breast, lumpectomy, Right - INVASIVE DUCTAL CARCINOMA, GRADE 2/3, SPANNING 1.7 CM.- DUCTAL CARCINOMA IN SITU, LOW GRADE. - THE SURGICAL RESECTION MARGINS ARE NEGATIVE FOR CARCINOMA.- SEE ONCOLOGY TABLE BELOW 2. Lymph node, sentinel, biopsy, Right- THERE IS NO EVIDENCE OF CARCINOMA IN 1 OF 1 LYMPH NODE (0/1). 3. Lymph node, sentinel, biopsy, Right- THERE IS NO EVIDENCE OF CARCINOMA IN 1 OF 1 LYMPH NODE (0/1). 4. Lymph node, sentinel, biopsy, Right - THERE IS NO EVIDENCE OF CARCINOMA IN 1 OF 1 LYMPH NODE (0/1). 5. Lymph node, sentinel, biopsy, Right - THERE IS NO EVIDENCE OF CARCINOMA IN 1 OF 1 LYMPH NODE (0/1). 6. Lymph node, sentinel, biopsy, Right - THERE IS NO EVIDENCE OF CARCINOMA IN 1 OF 1 LYMPH   Port a cath placement 09/29/15 Dr. Ninfa Linden, MD Past/Anticipated interventions by medical oncology, if any: Chemotherapy :Chem[facility] 09/29/15., Dr. Jana Hakim, appt Med/Onc ,10/03/15; 1st cycophosphamide / docetaxe x4 infusion 10/06/15 Oncotype DX Results=23, anti estrogens to follow radiation follow up July 2017  Lymphedema issues, if any: NO  Pain issues, if any: NO, incision well healed,   SAFETY ISSUES: NO  Prior radiation? NO  Pacemaker/ICD? NO  Possible current pregnancy? NO  Is the patient on methotrexate? NO  Current Complaints / other details: Married,  Menarche age 14,GOPO, hx oral contraceptives(1996-2016) time of dx on HRT 2 months, , Former cigarette smoker Quit 1989, occasional alcohol no illicit drug use, hx Anxiety Mother -Breast cancer age 87, , DM,HTN, ,sister age 58 thyroid cancer,   Allergies: Codeine sulfate, No Percocet-makes patient weird, Sulfamethoxazole     BP 124/75 mmHg  Pulse 83  Temp(Src) 98.1 F (36.7 C) (Oral)  Resp 16  Ht _0  (1.727 m)  Wt 224 lb 4.8 oz (101.742 kg)  BMI 34.11 kg/m2  SpO2 100%  Wt Readings from Last 3 Encounters:  01/07/16 224 lb 4.8 oz (101.742 kg)  12/24/15 215 lb 3.2 oz (97.614 kg)  12/16/15 219 lb 1.6 oz (99.383 kg)

## 2016-01-06 NOTE — Telephone Encounter (Signed)
Addendum to prior incomplete note-   Above symptoms discussed in relation to post chemo changes- which are usually transient.  Pt understands to call if symptoms persist or symptoms discussed occur for further work up.  No other needs at this time.

## 2016-01-07 ENCOUNTER — Ambulatory Visit
Admission: RE | Admit: 2016-01-07 | Discharge: 2016-01-07 | Disposition: A | Discharge status: Home / Self Care | Payer: BLUE CROSS/BLUE SHIELD | Source: Ambulatory Visit | Attending: Radiation Oncology | Admitting: Radiation Oncology

## 2016-01-07 ENCOUNTER — Encounter: Payer: Self-pay | Admitting: Radiation Oncology

## 2016-01-07 VITALS — BP 124/75 | HR 83 | Temp 98.1°F | Resp 16 | Ht 68.0 in | Wt 224.3 lb

## 2016-01-07 DIAGNOSIS — I10 Essential (primary) hypertension: Secondary | ICD-10-CM | POA: Insufficient documentation

## 2016-01-07 DIAGNOSIS — K219 Gastro-esophageal reflux disease without esophagitis: Secondary | ICD-10-CM | POA: Insufficient documentation

## 2016-01-07 DIAGNOSIS — C50411 Malignant neoplasm of upper-outer quadrant of right female breast: Secondary | ICD-10-CM

## 2016-01-07 DIAGNOSIS — Z170 Estrogen receptor positive status [ER+]: Secondary | ICD-10-CM | POA: Insufficient documentation

## 2016-01-07 DIAGNOSIS — Z510 Encounter for antineoplastic radiation therapy: Secondary | ICD-10-CM | POA: Insufficient documentation

## 2016-01-07 DIAGNOSIS — F17200 Nicotine dependence, unspecified, uncomplicated: Secondary | ICD-10-CM | POA: Insufficient documentation

## 2016-01-07 DIAGNOSIS — E785 Hyperlipidemia, unspecified: Secondary | ICD-10-CM | POA: Insufficient documentation

## 2016-01-07 DIAGNOSIS — F329 Major depressive disorder, single episode, unspecified: Secondary | ICD-10-CM | POA: Insufficient documentation

## 2016-01-07 DIAGNOSIS — E039 Hypothyroidism, unspecified: Secondary | ICD-10-CM | POA: Insufficient documentation

## 2016-01-07 DIAGNOSIS — F419 Anxiety disorder, unspecified: Secondary | ICD-10-CM | POA: Insufficient documentation

## 2016-01-07 NOTE — Progress Notes (Signed)
Please see the Nurse Progress Note in the MD Initial Consult Encounter for this patient. 

## 2016-01-07 NOTE — Progress Notes (Signed)
Radiation Oncology         [phone] ________________________________  Name: Tracy Hart MRN: [id number]  Date: 01/07/2016  DOB: 06-Dec-1960  BV:QXIH,WTUUEKCMKL R, MD  [name], MD     REFERRING PHYSICIAN: [name], MD   DIAGNOSIS: The encounter diagnosis was Breast cancer of upper-outer quadrant of right female breast ([facility]).  HISTORY OF PRESENT ILLNESS:Tracy Hart  "Tracy Hart" is a 55 y.o. female who is seen for an initial consultation at the request of Dr. Jana Hakim to discuss the role of radiotherapy to the breast for a right breast cancer.  She had a routine screening mammogram which showed a possible mass in the right breast. Core biopsy on 08/19/2015 revealed invasive and in situ mammary carcinoma in the upper outer quadrant of the right breast. This was found to be ER(90%+), PR (90%+), and Her2-neu negative with a KI-67 of 5%. She had her lumpectomy on 09/01/2015. This revealed Grade 2/3 invasive ductal carcinoma spanning 1.7 cm with associated low grade DCIS. Margins were negative. All 5 sentinel lymph nodes were negative. Her Oncotype Dx test score was 23. She underwent Cyclophosphamide and Taxotere treatment with Dr. Jana Hakim. She began this treatment in February of 2017 and completed her 4 cycles in May 2017.   She is here today to discuss radiation treatment in the management of her disease and is accompanied by her sister who was previously worked as a Facilities manager with Dr. Valere Dross at [facility].   PREVIOUS RADIATION THERAPY: No  PAST MEDICAL HISTORY:  Past Medical History  Diagnosis Date  . Cancer [facility]) 2016    right breast  . Breast cancer ([place]) 08/19/15    right breast  . Anxiety     takes Xanax daily as needed  . Hypertension     takes Benicar daily  . Hypothyroidism     takes Synthroid daily  . Depression     takes Lexapro daily  . Seasonal allergies     takes Zyrtec and uses Flonase daily  . Hyperlipidemia     takes Atorvastatin daily  .  History of bronchitis 2000  . History of hiatal hernia   . GERD (gastroesophageal reflux disease)   . Insomnia     takes Trazodone nightly      PAST SURGICAL HISTORY: Past Surgical History  Procedure Laterality Date  . Breast lumpectomy with radioactive seed and sentinel lymph node biopsy Right 09/01/2015    Procedure: RIGHT BREAST LUMPECTOMY WITH RADIOACTIVE SEED LOCALIZED AND SENTINEL LYMPH NODE BIOPSY;  Surgeon: [name], MD;  Location: [place];  Service: General;  Laterality: Right;  . Tonsillectomy  at age 36  . Hernia repair Right 2009  . Esophagogastroduodenoscopy    . Colonoscopy    . Portacath placement Left 09/29/2015    Procedure: INSERTION PORT-A-CATH;  Surgeon: [name], MD;  Location: [facility];  Service: General;  Laterality: Left;     FAMILY HISTORY:  Family History  Problem Relation Age of Onset  . Cancer Mother 87    breast  . Diabetes Mother   . Hypertension Mother   . Hypertension Father   . Cancer Sister 55    thyroid  . Seizures Sister      SOCIAL HISTORY:  reports that she has quit smoking. She does not have any smokeless tobacco history on file. She reports that she does not drink alcohol or use illicit drugs. 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 She  is married.   ALLERGIES: Codeine and Sulfa antibiotics   MEDICATIONS:  Current Outpatient Prescriptions  Medication Sig Dispense Refill  . ALPRAZolam (XANAX) 0.5 MG tablet Take 0.25 mg by mouth at bedtime as needed for anxiety. Reported on 11/25/2015    . atorvastatin (LIPITOR) 40 MG tablet Take 40 mg by mouth daily.    . cetirizine (ZYRTEC) 10 MG tablet Take 10 mg by mouth daily.    . cholecalciferol (VITAMIN D) 400 UNITS TABS tablet Take 800 Units by mouth 2 (two) times daily.    Marland Kitchen escitalopram (LEXAPRO) 10 MG tablet Take 10 mg by mouth daily.    . fluticasone (FLONASE) 50 MCG/ACT nasal spray Place 1 spray into both  nostrils daily. Reported on 11/25/2015    . gabapentin (NEURONTIN) 300 MG capsule Take 1 capsule (300 mg total) by mouth 3 (three) times daily. 30 capsule 3  . levothyroxine (SYNTHROID, LEVOTHROID) 137 MCG tablet Take 137 mcg by mouth daily before breakfast.    . lidocaine-prilocaine (EMLA) cream Apply 1 application topically as needed. 30 g 3  . olmesartan-hydrochlorothiazide (BENICAR HCT) 40-25 MG per tablet Take 1 tablet by mouth daily. Pt is taking 1/2 tablet daily instructed by PCP.    Marland Kitchen traZODone (DESYREL) 50 MG tablet Take 50 mg by mouth at bedtime. Reported on 12/02/2015    . aspirin 81 MG tablet Take 81 mg by mouth daily. Reported on 01/07/2016    . calcium carbonate (OS-CAL) 600 MG TABS tablet Take 600 mg by mouth 2 (two) times daily with a meal. Reported on 01/07/2016    . Omega-3 Fatty Acids (FISH OIL) 1200 MG CAPS Take by mouth 2 (two) times daily. Reported on 01/07/2016     No current facility-administered medications for this encounter.    REVIEW OF SYSTEMS:  On review of systems, the patient reports that she is doing well overall. She is feeling better since completion of chemotherapy and is pleased that her hair is growing back. She denies any chest pain, shortness of breath, cough, fevers, chills, night sweats, unintended weight changes. She denies any bowel or bladder disturbances, and denies abdominal pain, nausea or vomiting. She denies any new musculoskeletal or joint aches or pains. A complete review of systems is obtained and is otherwise negative.    PHYSICAL EXAM:  height is 5' 8"  (1.727 m) and weight is 224 lb 4.8 oz (101.742 kg). Her oral temperature is 98.1 F (36.7 C). Her blood pressure is 124/75 and her pulse is 83. Her respiration is 16 and oxygen saturation is 100%.    In general this is a well appearing caucasian female in no acute distress. She is alert and oriented x4 and appropriate throughout the examination. HEENT reveals that the patient is normocephalic,  atraumatic. EOMs are intact. PERRLA. Skin is intact without any evidence of gross lesions. Cardiovascular exam reveals a regular rate and rhythm, no clicks rubs or murmurs are auscultated. Chest is clear to auscultation bilaterally. Lymphatic assessment is performed and does not reveal any adenopathy in the cervical, supraclavicular, axillary, or inguinal chains. Right breast lumpectomy incision has healed well. Skin is intact in this area. No palpable masses are noted and no nipple discharge or bleeding is noted of the breasts bilaterally. Abdomen has active bowel sounds in all quadrants and is intact. The abdomen is soft, non tender, non distended. Lower extremities are negative for pretibial pitting edema, deep calf tenderness, cyanosis or clubbing.Marland Kitchen  ECOG = 100  0 - Asymptomatic (Fully active,  able to carry on all predisease activities without restriction)  1 - Symptomatic but completely ambulatory (Restricted in physically strenuous activity but ambulatory and able to carry out work of a light or sedentary nature. For example, light housework, office work)  2 - Symptomatic, <50% in bed during the day (Ambulatory and capable of all self care but unable to carry out any work activities. Up and about more than 50% of waking hours)  3 - Symptomatic, >50% in bed, but not bedbound (Capable of only limited self-care, confined to bed or chair 50% or more of waking hours)  4 - Bedbound (Completely disabled. Cannot carry on any self-care. Totally confined to bed or chair)  5 - Death   Eustace Pen MM, Creech RH, Tormey DC, et al. [phone]). "Toxicity and response criteria of the [facility]". Between Oncol. 5 (6): 649-55   LABORATORY DATA:  Lab Results  Component Value Date   WBC 16.4* 12/24/2015   HGB 10.8* 12/24/2015   HCT 32.2* 12/24/2015   MCV 103.2* 12/24/2015   PLT 212 12/24/2015   Lab Results  Component Value Date   NA 137 12/24/2015   K 4.5 12/24/2015   CL 105  09/26/2015   CO2 25 12/24/2015   Lab Results  Component Value Date   ALT 27 12/24/2015   AST 19 12/24/2015   ALKPHOS 90 12/24/2015   BILITOT <0.30 12/24/2015   RADIOGRAPHY: No results found.    IMPRESSION: Ms. Dannenberg is a pleasant 55 y.o female with a Stage IA, pT1cN0M0, ER/PR positive, HER-2 negative grade 2 invasive ductal carcinoma of the right breast.   PLAN: Dr. Lisbeth Renshaw reviewed her diagnosis and options for treatment. We discussed the equivalence in terms of survival and local failure between mastectomy and breast conservation. We discussed the role of radiation in decreasing local failures in patients who undergo lumpectomy. We discussed the process of simulation and the placement tattoos. We discussed 6 1/2 weeks of treatment as an outpatient in 33 fractions. We discussed the low likelihood of secondary malignancies. We discussed the possible side effects including but not limited to skin redness, fatigue, permanent skin darkening, and breast swelling. The patient has signed informed consent and is prepared to proceed with radiation. CT simulation has been scheduled for 01/14/2016 at 9 AM.   In a visit lasting greater than 50% of that time was spent discussing the delivery and specifics regarding her radiotherapy plan.  The above documentation reflects my direct findings during this shared patient visit. Please see the separate note by Dr. Lisbeth Renshaw on this date for the remainder of the patient's plan of care.    Carola Rhine, PAC   This document serves as a record of services personally performed by Shona Simpson, PA and [name], MD. It was created on their behalf by Jenell Milliner, a trained medical scribe. The creation of this record is based on the scribe's personal observations and the provider's statements to them. This document has been checked and approved by the attending provider.

## 2016-01-09 ENCOUNTER — Encounter: Payer: Self-pay | Admitting: Radiation Oncology

## 2016-01-09 NOTE — Addendum Note (Signed)
Encounter addended by: [name], MD on: 01/09/2016 10:11 PM<BR>     Documentation filed: Follow-up Section, LOS Section, Problem List

## 2016-01-14 ENCOUNTER — Ambulatory Visit
Admission: RE | Admit: 2016-01-14 | Discharge: 2016-01-14 | Disposition: A | Discharge status: Home / Self Care | Payer: BLUE CROSS/BLUE SHIELD | Source: Ambulatory Visit | Attending: Radiation Oncology | Admitting: Radiation Oncology

## 2016-01-14 DIAGNOSIS — I10 Essential (primary) hypertension: Secondary | ICD-10-CM | POA: Diagnosis not present

## 2016-01-14 DIAGNOSIS — Z510 Encounter for antineoplastic radiation therapy: Secondary | ICD-10-CM | POA: Diagnosis not present

## 2016-01-14 DIAGNOSIS — E785 Hyperlipidemia, unspecified: Secondary | ICD-10-CM | POA: Diagnosis not present

## 2016-01-14 DIAGNOSIS — Z170 Estrogen receptor positive status [ER+]: Secondary | ICD-10-CM | POA: Diagnosis not present

## 2016-01-14 DIAGNOSIS — F17200 Nicotine dependence, unspecified, uncomplicated: Secondary | ICD-10-CM | POA: Diagnosis not present

## 2016-01-14 DIAGNOSIS — C50411 Malignant neoplasm of upper-outer quadrant of right female breast: Secondary | ICD-10-CM | POA: Diagnosis not present

## 2016-01-14 DIAGNOSIS — F419 Anxiety disorder, unspecified: Secondary | ICD-10-CM | POA: Diagnosis not present

## 2016-01-14 DIAGNOSIS — K219 Gastro-esophageal reflux disease without esophagitis: Secondary | ICD-10-CM | POA: Diagnosis not present

## 2016-01-14 DIAGNOSIS — E039 Hypothyroidism, unspecified: Secondary | ICD-10-CM | POA: Diagnosis not present

## 2016-01-14 DIAGNOSIS — F329 Major depressive disorder, single episode, unspecified: Secondary | ICD-10-CM | POA: Diagnosis not present

## 2016-01-14 NOTE — Addendum Note (Signed)
Encounter addended by: [name], MD on: 01/14/2016  7:05 AM<BR>     Documentation filed: Notes Section

## 2016-01-16 DIAGNOSIS — Z510 Encounter for antineoplastic radiation therapy: Secondary | ICD-10-CM | POA: Diagnosis not present

## 2016-01-16 DIAGNOSIS — K219 Gastro-esophageal reflux disease without esophagitis: Secondary | ICD-10-CM | POA: Diagnosis not present

## 2016-01-16 DIAGNOSIS — F329 Major depressive disorder, single episode, unspecified: Secondary | ICD-10-CM | POA: Diagnosis not present

## 2016-01-16 DIAGNOSIS — E039 Hypothyroidism, unspecified: Secondary | ICD-10-CM | POA: Diagnosis not present

## 2016-01-16 DIAGNOSIS — C50411 Malignant neoplasm of upper-outer quadrant of right female breast: Secondary | ICD-10-CM | POA: Diagnosis not present

## 2016-01-16 DIAGNOSIS — F17200 Nicotine dependence, unspecified, uncomplicated: Secondary | ICD-10-CM | POA: Diagnosis not present

## 2016-01-16 DIAGNOSIS — I10 Essential (primary) hypertension: Secondary | ICD-10-CM | POA: Diagnosis not present

## 2016-01-16 DIAGNOSIS — F419 Anxiety disorder, unspecified: Secondary | ICD-10-CM | POA: Diagnosis not present

## 2016-01-16 DIAGNOSIS — E785 Hyperlipidemia, unspecified: Secondary | ICD-10-CM | POA: Diagnosis not present

## 2016-01-16 DIAGNOSIS — Z170 Estrogen receptor positive status [ER+]: Secondary | ICD-10-CM | POA: Diagnosis not present

## 2016-01-16 NOTE — Progress Notes (Signed)
  Radiation Oncology         [phone] ________________________________  Name: Tracy Hart MRN: [id number]  Date: 01/14/2016  DOB: 04-06-1961  Optical Surface Tracking Plan:  Since intensity modulated radiotherapy (IMRT) and 3D conformal radiation treatment methods are predicated on accurate and precise positioning for treatment, intrafraction motion monitoring is medically necessary to ensure accurate and safe treatment delivery.  The ability to quantify intrafraction motion without excessive ionizing radiation dose can only be performed with optical surface tracking. Accordingly, surface imaging offers the opportunity to obtain 3D measurements of patient position throughout IMRT and 3D treatments without excessive radiation exposure.  I am ordering optical surface tracking for this patient's upcoming course of radiotherapy. ________________________________  [name], MD 01/16/2016 10:45 AM    Reference:   Particia Jasper, et al. Surface imaging-based analysis of intrafraction motion for breast radiotherapy patients.Journal of [place], n. 6, nov. 2014. ISSN [id number].   Available at: <http://www.jacmp.org/index.php/jacmp/article/view/4957>.

## 2016-01-16 NOTE — Progress Notes (Addendum)
  Radiation Oncology         [phone]) [phone] ________________________________  Name: Tracy Hart MRN: [id number]  Date: 01/14/2016  DOB: 13-Dec-1960  DIAGNOSIS:     ICD-9-CM ICD-10-CM   1. Breast cancer of upper-outer quadrant of right female breast ([place]) 174.4 C50.411      SIMULATION AND TREATMENT PLANNING NOTE  The patient presented for simulation prior to beginning her course of radiation treatment for her diagnosis of right-sided breast cancer. The patient was placed in a supine position on a breast board. A customized vac-lock bag was constructed and this complex treatment device will be used on a daily basis during her treatment. In this fashion, a CT scan was obtained through the chest area and an isocenter was placed near the chest wall within the breast.  The patient will be planned to receive a course of radiation initially to a dose of 50.4 Gy. This will consist of a whole breast radiotherapy technique. To accomplish this, 2 customized blocks have been designed which will correspond to medial and lateral whole breast tangent fields. This treatment will be accomplished at 1.8 Gy per fraction. A forward planning technique will also be evaluated to determine if this approach improves the plan. It is anticipated that the patient will then receive a 10 Gy boost to the seroma cavity which has been contoured. This will be accomplished at 2 Gy per fraction.   This initial treatment will consist of a 3-D conformal technique. The seroma has been contoured as the primary target structure. Additionally, dose volume histograms of both this target as well as the lungs and heart will also be evaluated. Such an approach is necessary to ensure that the target area is adequately covered while the nearby critical  normal structures are adequately spared.  Plan:  The final anticipated total dose therefore will correspond to 60.4 Gy.    _______________________________   Jodelle Gross, MD, PhD

## 2016-01-22 ENCOUNTER — Ambulatory Visit
Admission: RE | Admit: 2016-01-22 | Discharge: 2016-01-22 | Disposition: A | Discharge status: Home / Self Care | Payer: BLUE CROSS/BLUE SHIELD | Source: Ambulatory Visit | Attending: Radiation Oncology | Admitting: Radiation Oncology

## 2016-01-22 DIAGNOSIS — E785 Hyperlipidemia, unspecified: Secondary | ICD-10-CM | POA: Diagnosis not present

## 2016-01-22 DIAGNOSIS — Z510 Encounter for antineoplastic radiation therapy: Secondary | ICD-10-CM | POA: Diagnosis not present

## 2016-01-22 DIAGNOSIS — C50411 Malignant neoplasm of upper-outer quadrant of right female breast: Secondary | ICD-10-CM | POA: Diagnosis not present

## 2016-01-22 DIAGNOSIS — Z170 Estrogen receptor positive status [ER+]: Secondary | ICD-10-CM | POA: Diagnosis not present

## 2016-01-22 DIAGNOSIS — E039 Hypothyroidism, unspecified: Secondary | ICD-10-CM | POA: Diagnosis not present

## 2016-01-22 DIAGNOSIS — I10 Essential (primary) hypertension: Secondary | ICD-10-CM | POA: Diagnosis not present

## 2016-01-22 DIAGNOSIS — F17200 Nicotine dependence, unspecified, uncomplicated: Secondary | ICD-10-CM | POA: Diagnosis not present

## 2016-01-22 DIAGNOSIS — F419 Anxiety disorder, unspecified: Secondary | ICD-10-CM | POA: Diagnosis not present

## 2016-01-22 DIAGNOSIS — K219 Gastro-esophageal reflux disease without esophagitis: Secondary | ICD-10-CM | POA: Diagnosis not present

## 2016-01-22 DIAGNOSIS — F329 Major depressive disorder, single episode, unspecified: Secondary | ICD-10-CM | POA: Diagnosis not present

## 2016-01-23 ENCOUNTER — Ambulatory Visit: Payer: BLUE CROSS/BLUE SHIELD

## 2016-01-26 ENCOUNTER — Ambulatory Visit
Admission: RE | Admit: 2016-01-26 | Discharge: 2016-01-26 | Disposition: A | Discharge status: Home / Self Care | Payer: BLUE CROSS/BLUE SHIELD | Source: Ambulatory Visit | Attending: Radiation Oncology | Admitting: Radiation Oncology

## 2016-01-26 ENCOUNTER — Ambulatory Visit: Payer: BLUE CROSS/BLUE SHIELD

## 2016-01-26 DIAGNOSIS — E039 Hypothyroidism, unspecified: Secondary | ICD-10-CM | POA: Diagnosis not present

## 2016-01-26 DIAGNOSIS — C50411 Malignant neoplasm of upper-outer quadrant of right female breast: Secondary | ICD-10-CM | POA: Diagnosis not present

## 2016-01-26 DIAGNOSIS — Z510 Encounter for antineoplastic radiation therapy: Secondary | ICD-10-CM | POA: Diagnosis not present

## 2016-01-26 DIAGNOSIS — F419 Anxiety disorder, unspecified: Secondary | ICD-10-CM | POA: Diagnosis not present

## 2016-01-26 DIAGNOSIS — F329 Major depressive disorder, single episode, unspecified: Secondary | ICD-10-CM | POA: Diagnosis not present

## 2016-01-26 DIAGNOSIS — Z170 Estrogen receptor positive status [ER+]: Secondary | ICD-10-CM | POA: Diagnosis not present

## 2016-01-26 DIAGNOSIS — I10 Essential (primary) hypertension: Secondary | ICD-10-CM | POA: Diagnosis not present

## 2016-01-26 DIAGNOSIS — K219 Gastro-esophageal reflux disease without esophagitis: Secondary | ICD-10-CM | POA: Diagnosis not present

## 2016-01-26 DIAGNOSIS — E785 Hyperlipidemia, unspecified: Secondary | ICD-10-CM | POA: Diagnosis not present

## 2016-01-26 DIAGNOSIS — F17200 Nicotine dependence, unspecified, uncomplicated: Secondary | ICD-10-CM | POA: Diagnosis not present

## 2016-01-27 ENCOUNTER — Ambulatory Visit
Admission: RE | Admit: 2016-01-27 | Discharge: 2016-01-27 | Disposition: A | Discharge status: Home / Self Care | Payer: BLUE CROSS/BLUE SHIELD | Source: Ambulatory Visit | Attending: Radiation Oncology | Admitting: Radiation Oncology

## 2016-01-27 DIAGNOSIS — Z510 Encounter for antineoplastic radiation therapy: Secondary | ICD-10-CM | POA: Diagnosis not present

## 2016-01-27 DIAGNOSIS — Z170 Estrogen receptor positive status [ER+]: Secondary | ICD-10-CM | POA: Diagnosis not present

## 2016-01-27 DIAGNOSIS — E039 Hypothyroidism, unspecified: Secondary | ICD-10-CM | POA: Diagnosis not present

## 2016-01-27 DIAGNOSIS — K219 Gastro-esophageal reflux disease without esophagitis: Secondary | ICD-10-CM | POA: Diagnosis not present

## 2016-01-27 DIAGNOSIS — E785 Hyperlipidemia, unspecified: Secondary | ICD-10-CM | POA: Diagnosis not present

## 2016-01-27 DIAGNOSIS — F419 Anxiety disorder, unspecified: Secondary | ICD-10-CM | POA: Diagnosis not present

## 2016-01-27 DIAGNOSIS — I10 Essential (primary) hypertension: Secondary | ICD-10-CM | POA: Diagnosis not present

## 2016-01-27 DIAGNOSIS — F17200 Nicotine dependence, unspecified, uncomplicated: Secondary | ICD-10-CM | POA: Diagnosis not present

## 2016-01-27 DIAGNOSIS — F329 Major depressive disorder, single episode, unspecified: Secondary | ICD-10-CM | POA: Diagnosis not present

## 2016-01-27 DIAGNOSIS — C50411 Malignant neoplasm of upper-outer quadrant of right female breast: Secondary | ICD-10-CM | POA: Diagnosis not present

## 2016-01-28 ENCOUNTER — Ambulatory Visit
Admission: RE | Admit: 2016-01-28 | Discharge: 2016-01-28 | Disposition: A | Discharge status: Home / Self Care | Payer: BLUE CROSS/BLUE SHIELD | Source: Ambulatory Visit | Attending: Radiation Oncology | Admitting: Radiation Oncology

## 2016-01-28 DIAGNOSIS — E039 Hypothyroidism, unspecified: Secondary | ICD-10-CM | POA: Diagnosis not present

## 2016-01-28 DIAGNOSIS — K219 Gastro-esophageal reflux disease without esophagitis: Secondary | ICD-10-CM | POA: Diagnosis not present

## 2016-01-28 DIAGNOSIS — I10 Essential (primary) hypertension: Secondary | ICD-10-CM | POA: Diagnosis not present

## 2016-01-28 DIAGNOSIS — C50411 Malignant neoplasm of upper-outer quadrant of right female breast: Secondary | ICD-10-CM | POA: Diagnosis not present

## 2016-01-28 DIAGNOSIS — Z170 Estrogen receptor positive status [ER+]: Secondary | ICD-10-CM | POA: Diagnosis not present

## 2016-01-28 DIAGNOSIS — E785 Hyperlipidemia, unspecified: Secondary | ICD-10-CM | POA: Diagnosis not present

## 2016-01-28 DIAGNOSIS — F329 Major depressive disorder, single episode, unspecified: Secondary | ICD-10-CM | POA: Diagnosis not present

## 2016-01-28 DIAGNOSIS — F419 Anxiety disorder, unspecified: Secondary | ICD-10-CM | POA: Diagnosis not present

## 2016-01-28 DIAGNOSIS — F17200 Nicotine dependence, unspecified, uncomplicated: Secondary | ICD-10-CM | POA: Diagnosis not present

## 2016-01-28 DIAGNOSIS — Z510 Encounter for antineoplastic radiation therapy: Secondary | ICD-10-CM | POA: Diagnosis not present

## 2016-01-28 MED ORDER — RADIAPLEXRX EX GEL
Freq: Once | CUTANEOUS | Status: AC
  Administered 2016-01-28: 12:00:00 via TOPICAL

## 2016-01-28 MED ORDER — ALRA NON-METALLIC DEODORANT (RAD-ONC)
1.0000 "application " | Freq: Once | TOPICAL | Status: AC
  Administered 2016-01-28: 1 via TOPICAL

## 2016-01-29 ENCOUNTER — Ambulatory Visit
Admission: RE | Admit: 2016-01-29 | Discharge: 2016-01-29 | Disposition: A | Discharge status: Home / Self Care | Payer: BLUE CROSS/BLUE SHIELD | Source: Ambulatory Visit | Attending: Radiation Oncology | Admitting: Radiation Oncology

## 2016-01-29 DIAGNOSIS — I10 Essential (primary) hypertension: Secondary | ICD-10-CM | POA: Diagnosis not present

## 2016-01-29 DIAGNOSIS — E039 Hypothyroidism, unspecified: Secondary | ICD-10-CM | POA: Diagnosis not present

## 2016-01-29 DIAGNOSIS — F17200 Nicotine dependence, unspecified, uncomplicated: Secondary | ICD-10-CM | POA: Diagnosis not present

## 2016-01-29 DIAGNOSIS — F329 Major depressive disorder, single episode, unspecified: Secondary | ICD-10-CM | POA: Diagnosis not present

## 2016-01-29 DIAGNOSIS — E785 Hyperlipidemia, unspecified: Secondary | ICD-10-CM | POA: Diagnosis not present

## 2016-01-29 DIAGNOSIS — Z170 Estrogen receptor positive status [ER+]: Secondary | ICD-10-CM | POA: Diagnosis not present

## 2016-01-29 DIAGNOSIS — F419 Anxiety disorder, unspecified: Secondary | ICD-10-CM | POA: Diagnosis not present

## 2016-01-29 DIAGNOSIS — Z510 Encounter for antineoplastic radiation therapy: Secondary | ICD-10-CM | POA: Diagnosis not present

## 2016-01-29 DIAGNOSIS — C50411 Malignant neoplasm of upper-outer quadrant of right female breast: Secondary | ICD-10-CM | POA: Diagnosis not present

## 2016-01-29 DIAGNOSIS — K219 Gastro-esophageal reflux disease without esophagitis: Secondary | ICD-10-CM | POA: Diagnosis not present

## 2016-01-30 ENCOUNTER — Encounter: Payer: Self-pay | Admitting: Radiation Oncology

## 2016-01-30 ENCOUNTER — Ambulatory Visit
Admission: RE | Admit: 2016-01-30 | Discharge: 2016-01-30 | Disposition: A | Discharge status: Home / Self Care | Payer: BLUE CROSS/BLUE SHIELD | Source: Ambulatory Visit | Attending: Radiation Oncology | Admitting: Radiation Oncology

## 2016-01-30 VITALS — BP 138/87 | HR 88 | Temp 98.2°F | Resp 20 | Wt 219.7 lb

## 2016-01-30 DIAGNOSIS — E785 Hyperlipidemia, unspecified: Secondary | ICD-10-CM | POA: Diagnosis not present

## 2016-01-30 DIAGNOSIS — E039 Hypothyroidism, unspecified: Secondary | ICD-10-CM | POA: Diagnosis not present

## 2016-01-30 DIAGNOSIS — Z170 Estrogen receptor positive status [ER+]: Secondary | ICD-10-CM | POA: Diagnosis not present

## 2016-01-30 DIAGNOSIS — Z510 Encounter for antineoplastic radiation therapy: Secondary | ICD-10-CM | POA: Diagnosis not present

## 2016-01-30 DIAGNOSIS — C50411 Malignant neoplasm of upper-outer quadrant of right female breast: Secondary | ICD-10-CM | POA: Diagnosis not present

## 2016-01-30 DIAGNOSIS — F419 Anxiety disorder, unspecified: Secondary | ICD-10-CM | POA: Diagnosis not present

## 2016-01-30 DIAGNOSIS — F17200 Nicotine dependence, unspecified, uncomplicated: Secondary | ICD-10-CM | POA: Diagnosis not present

## 2016-01-30 DIAGNOSIS — F329 Major depressive disorder, single episode, unspecified: Secondary | ICD-10-CM | POA: Diagnosis not present

## 2016-01-30 DIAGNOSIS — I10 Essential (primary) hypertension: Secondary | ICD-10-CM | POA: Diagnosis not present

## 2016-01-30 DIAGNOSIS — K219 Gastro-esophageal reflux disease without esophagitis: Secondary | ICD-10-CM | POA: Diagnosis not present

## 2016-01-30 NOTE — Progress Notes (Signed)
weekkly rad txs right breast, mild erythema near nipple on right side, skin intact, using radiaplex, no c/o pain 8:51 AM BP 138/87 mmHg  Pulse 88  Temp(Src) 98.2 F (36.8 C) (Oral)  Resp 20  Wt 219 lb 11.2 oz (99.655 kg)  Wt Readings from Last 3 Encounters:  01/30/16 219 lb 11.2 oz (99.655 kg)  01/07/16 224 lb 4.8 oz (101.742 kg)  12/24/15 215 lb 3.2 oz (97.614 kg)

## 2016-01-31 ENCOUNTER — Ambulatory Visit: Payer: BLUE CROSS/BLUE SHIELD

## 2016-02-01 NOTE — Progress Notes (Signed)
Department of Radiation Oncology  Phone:  [phone] Fax:        [phone]  Weekly Treatment Note    Name: Tracy Hart Date: 02/01/2016 MRN: [id number] DOB: 07-Jun-1961   Diagnosis:     ICD-9-CM ICD-10-CM   1. Breast cancer of upper-outer quadrant of right female breast ([place]) 174.4 C50.411      Current dose: 9 Gy  Current fraction: 5   MEDICATIONS: Current Outpatient Prescriptions  Medication Sig Dispense Refill  . ALPRAZolam (XANAX) 0.5 MG tablet Take 0.25 mg by mouth at bedtime as needed for anxiety. Reported on 11/25/2015    . aspirin 81 MG tablet Take 81 mg by mouth daily. Reported on 01/07/2016    . atorvastatin (LIPITOR) 40 MG tablet Take 40 mg by mouth daily.    . calcium carbonate (OS-CAL) 600 MG TABS tablet Take 600 mg by mouth 2 (two) times daily with a meal. Reported on 01/07/2016    . cetirizine (ZYRTEC) 10 MG tablet Take 10 mg by mouth daily.    . cholecalciferol (VITAMIN D) 400 UNITS TABS tablet Take 800 Units by mouth 2 (two) times daily.    Marland Kitchen escitalopram (LEXAPRO) 10 MG tablet Take 10 mg by mouth daily.    . fluticasone (FLONASE) 50 MCG/ACT nasal spray Place 1 spray into both nostrils daily. Reported on 11/25/2015    . gabapentin (NEURONTIN) 300 MG capsule Take 1 capsule (300 mg total) by mouth 3 (three) times daily. 30 capsule 3  . levothyroxine (SYNTHROID, LEVOTHROID) 137 MCG tablet Take 137 mcg by mouth daily before breakfast.    . lidocaine-prilocaine (EMLA) cream Apply 1 application topically as needed. 30 g 3  . non-metallic deodorant (ALRA) MISC Apply 1 application topically daily as needed.    Marland Kitchen olmesartan-hydrochlorothiazide (BENICAR HCT) 40-25 MG per tablet Take 1 tablet by mouth daily. Pt is taking 1/2 tablet daily instructed by PCP.    Marland Kitchen Omega-3 Fatty Acids (FISH OIL) 1200 MG CAPS Take by mouth 2 (two) times daily. Reported on 01/07/2016    . traZODone (DESYREL) 50 MG tablet Take 50 mg by mouth at bedtime. Reported on 12/02/2015    . Wound  Cleansers (RADIAPLEX EX) Apply topically.     No current facility-administered medications for this encounter.     ALLERGIES: Codeine and Sulfa antibiotics   LABORATORY DATA:  Lab Results  Component Value Date   WBC 16.4* 12/24/2015   HGB 10.8* 12/24/2015   HCT 32.2* 12/24/2015   MCV 103.2* 12/24/2015   PLT 212 12/24/2015   Lab Results  Component Value Date   NA 137 12/24/2015   K 4.5 12/24/2015   CL 105 09/26/2015   CO2 25 12/24/2015   Lab Results  Component Value Date   ALT 27 12/24/2015   AST 19 12/24/2015   ALKPHOS 90 12/24/2015   BILITOT <0.30 12/24/2015     NARRATIVE: [name] was seen today for weekly treatment management. The chart was checked and the patient's films were reviewed.  weekkly rad txs right breast, mild erythema near nipple on right side, skin intact, using radiaplex, no c/o pain 2:25 PM BP 138/87 mmHg  Pulse 88  Temp(Src) 98.2 F (36.8 C) (Oral)  Resp 20  Wt 219 lb 11.2 oz (99.655 kg)  Wt Readings from Last 3 Encounters:  01/30/16 219 lb 11.2 oz (99.655 kg)  01/07/16 224 lb 4.8 oz (101.742 kg)  12/24/15 215 lb 3.2 oz (97.614 kg)    PHYSICAL EXAMINATION: weight is 219 lb  11.2 oz (99.655 kg). Her oral temperature is 98.2 F (36.8 C). Her blood pressure is 138/87 and her pulse is 88. Her respiration is 20.        ASSESSMENT: The patient is doing satisfactorily with treatment.  PLAN: We will continue with the patient's radiation treatment as planned.

## 2016-02-02 ENCOUNTER — Ambulatory Visit
Admission: RE | Admit: 2016-02-02 | Discharge: 2016-02-02 | Disposition: A | Discharge status: Home / Self Care | Payer: BLUE CROSS/BLUE SHIELD | Source: Ambulatory Visit | Attending: Radiation Oncology | Admitting: Radiation Oncology

## 2016-02-02 ENCOUNTER — Telehealth: Payer: Self-pay | Admitting: *Deleted

## 2016-02-02 DIAGNOSIS — F17200 Nicotine dependence, unspecified, uncomplicated: Secondary | ICD-10-CM | POA: Diagnosis not present

## 2016-02-02 DIAGNOSIS — Z170 Estrogen receptor positive status [ER+]: Secondary | ICD-10-CM | POA: Diagnosis not present

## 2016-02-02 DIAGNOSIS — I10 Essential (primary) hypertension: Secondary | ICD-10-CM | POA: Diagnosis not present

## 2016-02-02 DIAGNOSIS — F329 Major depressive disorder, single episode, unspecified: Secondary | ICD-10-CM | POA: Diagnosis not present

## 2016-02-02 DIAGNOSIS — Z510 Encounter for antineoplastic radiation therapy: Secondary | ICD-10-CM | POA: Diagnosis not present

## 2016-02-02 DIAGNOSIS — F419 Anxiety disorder, unspecified: Secondary | ICD-10-CM | POA: Diagnosis not present

## 2016-02-02 DIAGNOSIS — E785 Hyperlipidemia, unspecified: Secondary | ICD-10-CM | POA: Diagnosis not present

## 2016-02-02 DIAGNOSIS — C50411 Malignant neoplasm of upper-outer quadrant of right female breast: Secondary | ICD-10-CM | POA: Diagnosis not present

## 2016-02-02 DIAGNOSIS — E039 Hypothyroidism, unspecified: Secondary | ICD-10-CM | POA: Diagnosis not present

## 2016-02-02 DIAGNOSIS — K219 Gastro-esophageal reflux disease without esophagitis: Secondary | ICD-10-CM | POA: Diagnosis not present

## 2016-02-02 NOTE — Telephone Encounter (Signed)
  Oncology Nurse Navigator Documentation    Navigator Encounter Type: Telephone (Left vm for return call to assess needs during xrt. ) (02/02/16 1200) Telephone: Lahoma Crocker Call (Contact information provided for return call.) (02/02/16 1200)         Patient Visit Type: RadOnc (02/02/16 1200) Treatment Phase: First Radiation Tx (02/02/16 1200)                            Time Spent with Patient: 15 (02/02/16 1200)

## 2016-02-03 ENCOUNTER — Ambulatory Visit
Admission: RE | Admit: 2016-02-03 | Discharge: 2016-02-03 | Disposition: A | Discharge status: Home / Self Care | Payer: BLUE CROSS/BLUE SHIELD | Source: Ambulatory Visit | Attending: Radiation Oncology | Admitting: Radiation Oncology

## 2016-02-03 DIAGNOSIS — F17200 Nicotine dependence, unspecified, uncomplicated: Secondary | ICD-10-CM | POA: Diagnosis not present

## 2016-02-03 DIAGNOSIS — Z170 Estrogen receptor positive status [ER+]: Secondary | ICD-10-CM | POA: Diagnosis not present

## 2016-02-03 DIAGNOSIS — E785 Hyperlipidemia, unspecified: Secondary | ICD-10-CM | POA: Diagnosis not present

## 2016-02-03 DIAGNOSIS — I10 Essential (primary) hypertension: Secondary | ICD-10-CM | POA: Diagnosis not present

## 2016-02-03 DIAGNOSIS — C50411 Malignant neoplasm of upper-outer quadrant of right female breast: Secondary | ICD-10-CM | POA: Diagnosis not present

## 2016-02-03 DIAGNOSIS — F329 Major depressive disorder, single episode, unspecified: Secondary | ICD-10-CM | POA: Diagnosis not present

## 2016-02-03 DIAGNOSIS — F419 Anxiety disorder, unspecified: Secondary | ICD-10-CM | POA: Diagnosis not present

## 2016-02-03 DIAGNOSIS — Z510 Encounter for antineoplastic radiation therapy: Secondary | ICD-10-CM | POA: Diagnosis not present

## 2016-02-03 DIAGNOSIS — E039 Hypothyroidism, unspecified: Secondary | ICD-10-CM | POA: Diagnosis not present

## 2016-02-03 DIAGNOSIS — K219 Gastro-esophageal reflux disease without esophagitis: Secondary | ICD-10-CM | POA: Diagnosis not present

## 2016-02-04 ENCOUNTER — Ambulatory Visit
Admission: RE | Admit: 2016-02-04 | Discharge: 2016-02-04 | Disposition: A | Discharge status: Home / Self Care | Payer: BLUE CROSS/BLUE SHIELD | Source: Ambulatory Visit | Attending: Radiation Oncology | Admitting: Radiation Oncology

## 2016-02-04 ENCOUNTER — Encounter: Payer: Self-pay | Admitting: Radiation Oncology

## 2016-02-04 VITALS — BP 103/70 | HR 80 | Temp 98.4°F | Resp 20 | Wt 218.0 lb

## 2016-02-04 DIAGNOSIS — F419 Anxiety disorder, unspecified: Secondary | ICD-10-CM | POA: Diagnosis not present

## 2016-02-04 DIAGNOSIS — Z170 Estrogen receptor positive status [ER+]: Secondary | ICD-10-CM | POA: Diagnosis not present

## 2016-02-04 DIAGNOSIS — E785 Hyperlipidemia, unspecified: Secondary | ICD-10-CM | POA: Diagnosis not present

## 2016-02-04 DIAGNOSIS — K219 Gastro-esophageal reflux disease without esophagitis: Secondary | ICD-10-CM | POA: Diagnosis not present

## 2016-02-04 DIAGNOSIS — Z510 Encounter for antineoplastic radiation therapy: Secondary | ICD-10-CM | POA: Diagnosis not present

## 2016-02-04 DIAGNOSIS — F329 Major depressive disorder, single episode, unspecified: Secondary | ICD-10-CM | POA: Diagnosis not present

## 2016-02-04 DIAGNOSIS — F17200 Nicotine dependence, unspecified, uncomplicated: Secondary | ICD-10-CM | POA: Diagnosis not present

## 2016-02-04 DIAGNOSIS — C50411 Malignant neoplasm of upper-outer quadrant of right female breast: Secondary | ICD-10-CM | POA: Diagnosis not present

## 2016-02-04 DIAGNOSIS — E039 Hypothyroidism, unspecified: Secondary | ICD-10-CM | POA: Diagnosis not present

## 2016-02-04 DIAGNOSIS — I10 Essential (primary) hypertension: Secondary | ICD-10-CM | POA: Diagnosis not present

## 2016-02-04 NOTE — Progress Notes (Addendum)
Weekly rad txs  Right breast  8/ completed, mild erythema ,skin intact, no c/o There were no vitals taken for this visit.  Wt Readings from Last 3 Encounters:  01/30/16 219 lb 11.2 oz (99.655 kg)  01/07/16 224 lb 4.8 oz (101.742 kg)  12/24/15 215 lb 3.2 oz (97.614 kg)  BP 103/70 mmHg  Pulse 80  Temp(Src) 98.4 F (36.9 C) (Oral)  Resp 20  Wt 218 lb (98.884 kg)

## 2016-02-04 NOTE — Progress Notes (Signed)
Department of Radiation Oncology  Phone:  [phone] Fax:        [phone]  Weekly Treatment Note    Name: Tracy Hart Date: 02/04/2016 MRN: [id number] DOB: [birth year]   Diagnosis:     ICD-9-CM ICD-10-CM   1. Breast cancer of upper-outer quadrant of right female breast ([place]) 174.4 C50.411      Current dose: 14.4 Gy  Current fraction: 8   MEDICATIONS: Current Outpatient Prescriptions  Medication Sig Dispense Refill  . aspirin 81 MG tablet Take 81 mg by mouth daily. Reported on 01/07/2016    . atorvastatin (LIPITOR) 40 MG tablet Take 40 mg by mouth daily.    . calcium carbonate (OS-CAL) 600 MG TABS tablet Take 600 mg by mouth 2 (two) times daily with a meal. Reported on 01/07/2016    . cetirizine (ZYRTEC) 10 MG tablet Take 10 mg by mouth daily.    . cholecalciferol (VITAMIN D) 400 UNITS TABS tablet Take 800 Units by mouth 2 (two) times daily.    Marland Kitchen escitalopram (LEXAPRO) 10 MG tablet Take 10 mg by mouth daily.    . fluticasone (FLONASE) 50 MCG/ACT nasal spray Place 1 spray into both nostrils daily. Reported on 11/25/2015    . gabapentin (NEURONTIN) 300 MG capsule Take 1 capsule (300 mg total) by mouth 3 (three) times daily. 30 capsule 3  . levothyroxine (SYNTHROID, LEVOTHROID) 137 MCG tablet Take 137 mcg by mouth daily before breakfast.    . lidocaine-prilocaine (EMLA) cream Apply 1 application topically as needed. 30 g 3  . non-metallic deodorant (ALRA) MISC Apply 1 application topically daily as needed.    Marland Kitchen olmesartan-hydrochlorothiazide (BENICAR HCT) 40-25 MG per tablet Take 1 tablet by mouth daily. Pt is taking 1/2 tablet daily instructed by PCP.    Marland Kitchen Omega-3 Fatty Acids (FISH OIL) 1200 MG CAPS Take by mouth 2 (two) times daily. Reported on 01/07/2016    . traZODone (DESYREL) 50 MG tablet Take 50 mg by mouth at bedtime. Reported on 12/02/2015    . Wound Cleansers (RADIAPLEX EX) Apply topically.    . ALPRAZolam (XANAX) 0.5 MG tablet Take 0.25 mg by mouth at bedtime as  needed for anxiety. Reported on 02/04/2016     No current facility-administered medications for this encounter.     ALLERGIES: Codeine and Sulfa antibiotics   LABORATORY DATA:  Lab Results  Component Value Date   WBC 16.4* 12/24/2015   HGB 10.8* 12/24/2015   HCT 32.2* 12/24/2015   MCV 103.2* 12/24/2015   PLT 212 12/24/2015   Lab Results  Component Value Date   NA 137 12/24/2015   K 4.5 12/24/2015   CL 105 09/26/2015   CO2 25 12/24/2015   Lab Results  Component Value Date   ALT 27 12/24/2015   AST 19 12/24/2015   ALKPHOS 90 12/24/2015   BILITOT <0.30 12/24/2015     NARRATIVE: Tracy Hart was seen today for weekly treatment management. The chart was checked and the patient's films were reviewed.  The patient has completed 8 treatments to the right breast. She reports some mild joint pain and pink skin in the treatment field. She has some fatigue, but is overall doing well. She is using the Radiaplex as directed.  12:21 PM BP 103/70 mmHg  Pulse 80  Temp(Src) 98.4 F (36.9 C) (Oral)  Resp 20  Wt 218 lb (98.884 kg)  Wt Readings from Last 3 Encounters:  02/04/16 218 lb (98.884 kg)  01/30/16 219 lb 11.2 oz (99.655 kg)  01/07/16 224 lb 4.8 oz (101.742 kg)    PHYSICAL EXAMINATION: weight is 218 lb (98.884 kg). Her oral temperature is 98.4 F (36.9 C). Her blood pressure is 103/70 and her pulse is 80. Her respiration is 20.  Alert and oriented x 3. In no acute distress.  ASSESSMENT: The patient is doing satisfactorily with treatment.  PLAN: We will continue with the patient's radiation treatment as planned.   ------------------------------------------------  Jodelle Gross, MD, PhD  This document serves as a record of services personally performed by [name], MD. It was created on his behalf by Darcus Austin, a trained medical scribe. The creation of this record is based on the scribe's personal observations and the provider's statements to them. This document has  been checked and approved by the attending provider.

## 2016-02-05 ENCOUNTER — Ambulatory Visit
Admission: RE | Admit: 2016-02-05 | Discharge: 2016-02-05 | Disposition: A | Discharge status: Home / Self Care | Payer: BLUE CROSS/BLUE SHIELD | Source: Ambulatory Visit | Attending: Radiation Oncology | Admitting: Radiation Oncology

## 2016-02-05 DIAGNOSIS — F17200 Nicotine dependence, unspecified, uncomplicated: Secondary | ICD-10-CM | POA: Diagnosis not present

## 2016-02-05 DIAGNOSIS — K219 Gastro-esophageal reflux disease without esophagitis: Secondary | ICD-10-CM | POA: Diagnosis not present

## 2016-02-05 DIAGNOSIS — F329 Major depressive disorder, single episode, unspecified: Secondary | ICD-10-CM | POA: Diagnosis not present

## 2016-02-05 DIAGNOSIS — E039 Hypothyroidism, unspecified: Secondary | ICD-10-CM | POA: Diagnosis not present

## 2016-02-05 DIAGNOSIS — C50411 Malignant neoplasm of upper-outer quadrant of right female breast: Secondary | ICD-10-CM | POA: Diagnosis not present

## 2016-02-05 DIAGNOSIS — I10 Essential (primary) hypertension: Secondary | ICD-10-CM | POA: Diagnosis not present

## 2016-02-05 DIAGNOSIS — Z510 Encounter for antineoplastic radiation therapy: Secondary | ICD-10-CM | POA: Diagnosis not present

## 2016-02-05 DIAGNOSIS — Z170 Estrogen receptor positive status [ER+]: Secondary | ICD-10-CM | POA: Diagnosis not present

## 2016-02-05 DIAGNOSIS — F419 Anxiety disorder, unspecified: Secondary | ICD-10-CM | POA: Diagnosis not present

## 2016-02-05 DIAGNOSIS — E785 Hyperlipidemia, unspecified: Secondary | ICD-10-CM | POA: Diagnosis not present

## 2016-02-06 ENCOUNTER — Ambulatory Visit
Admission: RE | Admit: 2016-02-06 | Discharge: 2016-02-06 | Disposition: A | Discharge status: Home / Self Care | Payer: BLUE CROSS/BLUE SHIELD | Source: Ambulatory Visit | Attending: Radiation Oncology | Admitting: Radiation Oncology

## 2016-02-06 DIAGNOSIS — Z510 Encounter for antineoplastic radiation therapy: Secondary | ICD-10-CM | POA: Diagnosis not present

## 2016-02-06 DIAGNOSIS — K219 Gastro-esophageal reflux disease without esophagitis: Secondary | ICD-10-CM | POA: Diagnosis not present

## 2016-02-06 DIAGNOSIS — I10 Essential (primary) hypertension: Secondary | ICD-10-CM | POA: Diagnosis not present

## 2016-02-06 DIAGNOSIS — F17200 Nicotine dependence, unspecified, uncomplicated: Secondary | ICD-10-CM | POA: Diagnosis not present

## 2016-02-06 DIAGNOSIS — E039 Hypothyroidism, unspecified: Secondary | ICD-10-CM | POA: Diagnosis not present

## 2016-02-06 DIAGNOSIS — C50411 Malignant neoplasm of upper-outer quadrant of right female breast: Secondary | ICD-10-CM | POA: Diagnosis not present

## 2016-02-06 DIAGNOSIS — F419 Anxiety disorder, unspecified: Secondary | ICD-10-CM | POA: Diagnosis not present

## 2016-02-06 DIAGNOSIS — F329 Major depressive disorder, single episode, unspecified: Secondary | ICD-10-CM | POA: Diagnosis not present

## 2016-02-06 DIAGNOSIS — Z170 Estrogen receptor positive status [ER+]: Secondary | ICD-10-CM | POA: Diagnosis not present

## 2016-02-06 DIAGNOSIS — E785 Hyperlipidemia, unspecified: Secondary | ICD-10-CM | POA: Diagnosis not present

## 2016-02-09 ENCOUNTER — Ambulatory Visit
Admission: RE | Admit: 2016-02-09 | Discharge: 2016-02-09 | Disposition: A | Discharge status: Home / Self Care | Payer: BLUE CROSS/BLUE SHIELD | Source: Ambulatory Visit | Attending: Radiation Oncology | Admitting: Radiation Oncology

## 2016-02-09 DIAGNOSIS — K219 Gastro-esophageal reflux disease without esophagitis: Secondary | ICD-10-CM | POA: Diagnosis not present

## 2016-02-09 DIAGNOSIS — E039 Hypothyroidism, unspecified: Secondary | ICD-10-CM | POA: Diagnosis not present

## 2016-02-09 DIAGNOSIS — F17200 Nicotine dependence, unspecified, uncomplicated: Secondary | ICD-10-CM | POA: Diagnosis not present

## 2016-02-09 DIAGNOSIS — Z170 Estrogen receptor positive status [ER+]: Secondary | ICD-10-CM | POA: Diagnosis not present

## 2016-02-09 DIAGNOSIS — F419 Anxiety disorder, unspecified: Secondary | ICD-10-CM | POA: Diagnosis not present

## 2016-02-09 DIAGNOSIS — I10 Essential (primary) hypertension: Secondary | ICD-10-CM | POA: Diagnosis not present

## 2016-02-09 DIAGNOSIS — E785 Hyperlipidemia, unspecified: Secondary | ICD-10-CM | POA: Diagnosis not present

## 2016-02-09 DIAGNOSIS — F329 Major depressive disorder, single episode, unspecified: Secondary | ICD-10-CM | POA: Diagnosis not present

## 2016-02-09 DIAGNOSIS — Z510 Encounter for antineoplastic radiation therapy: Secondary | ICD-10-CM | POA: Diagnosis not present

## 2016-02-09 DIAGNOSIS — C50411 Malignant neoplasm of upper-outer quadrant of right female breast: Secondary | ICD-10-CM | POA: Diagnosis not present

## 2016-02-10 ENCOUNTER — Ambulatory Visit
Admission: RE | Admit: 2016-02-10 | Discharge: 2016-02-10 | Disposition: A | Discharge status: Home / Self Care | Payer: BLUE CROSS/BLUE SHIELD | Source: Ambulatory Visit | Attending: Radiation Oncology | Admitting: Radiation Oncology

## 2016-02-10 DIAGNOSIS — K219 Gastro-esophageal reflux disease without esophagitis: Secondary | ICD-10-CM | POA: Diagnosis not present

## 2016-02-10 DIAGNOSIS — F17200 Nicotine dependence, unspecified, uncomplicated: Secondary | ICD-10-CM | POA: Diagnosis not present

## 2016-02-10 DIAGNOSIS — E785 Hyperlipidemia, unspecified: Secondary | ICD-10-CM | POA: Diagnosis not present

## 2016-02-10 DIAGNOSIS — Z510 Encounter for antineoplastic radiation therapy: Secondary | ICD-10-CM | POA: Diagnosis not present

## 2016-02-10 DIAGNOSIS — F419 Anxiety disorder, unspecified: Secondary | ICD-10-CM | POA: Diagnosis not present

## 2016-02-10 DIAGNOSIS — Z170 Estrogen receptor positive status [ER+]: Secondary | ICD-10-CM | POA: Diagnosis not present

## 2016-02-10 DIAGNOSIS — C50411 Malignant neoplasm of upper-outer quadrant of right female breast: Secondary | ICD-10-CM | POA: Diagnosis not present

## 2016-02-10 DIAGNOSIS — F329 Major depressive disorder, single episode, unspecified: Secondary | ICD-10-CM | POA: Diagnosis not present

## 2016-02-10 DIAGNOSIS — E039 Hypothyroidism, unspecified: Secondary | ICD-10-CM | POA: Diagnosis not present

## 2016-02-10 DIAGNOSIS — I10 Essential (primary) hypertension: Secondary | ICD-10-CM | POA: Diagnosis not present

## 2016-02-11 ENCOUNTER — Ambulatory Visit
Admission: RE | Admit: 2016-02-11 | Discharge: 2016-02-11 | Disposition: A | Discharge status: Home / Self Care | Payer: BLUE CROSS/BLUE SHIELD | Source: Ambulatory Visit | Attending: Radiation Oncology | Admitting: Radiation Oncology

## 2016-02-11 DIAGNOSIS — K219 Gastro-esophageal reflux disease without esophagitis: Secondary | ICD-10-CM | POA: Diagnosis not present

## 2016-02-11 DIAGNOSIS — C50411 Malignant neoplasm of upper-outer quadrant of right female breast: Secondary | ICD-10-CM | POA: Diagnosis not present

## 2016-02-11 DIAGNOSIS — E785 Hyperlipidemia, unspecified: Secondary | ICD-10-CM | POA: Diagnosis not present

## 2016-02-11 DIAGNOSIS — I10 Essential (primary) hypertension: Secondary | ICD-10-CM | POA: Diagnosis not present

## 2016-02-11 DIAGNOSIS — F419 Anxiety disorder, unspecified: Secondary | ICD-10-CM | POA: Diagnosis not present

## 2016-02-11 DIAGNOSIS — F17200 Nicotine dependence, unspecified, uncomplicated: Secondary | ICD-10-CM | POA: Diagnosis not present

## 2016-02-11 DIAGNOSIS — Z510 Encounter for antineoplastic radiation therapy: Secondary | ICD-10-CM | POA: Diagnosis not present

## 2016-02-11 DIAGNOSIS — E039 Hypothyroidism, unspecified: Secondary | ICD-10-CM | POA: Diagnosis not present

## 2016-02-11 DIAGNOSIS — Z170 Estrogen receptor positive status [ER+]: Secondary | ICD-10-CM | POA: Diagnosis not present

## 2016-02-11 DIAGNOSIS — F329 Major depressive disorder, single episode, unspecified: Secondary | ICD-10-CM | POA: Diagnosis not present

## 2016-02-12 ENCOUNTER — Ambulatory Visit
Admission: RE | Admit: 2016-02-12 | Discharge: 2016-02-12 | Disposition: A | Discharge status: Home / Self Care | Payer: BLUE CROSS/BLUE SHIELD | Source: Ambulatory Visit | Attending: Radiation Oncology | Admitting: Radiation Oncology

## 2016-02-12 DIAGNOSIS — F17200 Nicotine dependence, unspecified, uncomplicated: Secondary | ICD-10-CM | POA: Diagnosis not present

## 2016-02-12 DIAGNOSIS — Z170 Estrogen receptor positive status [ER+]: Secondary | ICD-10-CM | POA: Diagnosis not present

## 2016-02-12 DIAGNOSIS — K219 Gastro-esophageal reflux disease without esophagitis: Secondary | ICD-10-CM | POA: Diagnosis not present

## 2016-02-12 DIAGNOSIS — F329 Major depressive disorder, single episode, unspecified: Secondary | ICD-10-CM | POA: Diagnosis not present

## 2016-02-12 DIAGNOSIS — E039 Hypothyroidism, unspecified: Secondary | ICD-10-CM | POA: Diagnosis not present

## 2016-02-12 DIAGNOSIS — C50411 Malignant neoplasm of upper-outer quadrant of right female breast: Secondary | ICD-10-CM | POA: Diagnosis not present

## 2016-02-12 DIAGNOSIS — Z510 Encounter for antineoplastic radiation therapy: Secondary | ICD-10-CM | POA: Diagnosis not present

## 2016-02-12 DIAGNOSIS — F419 Anxiety disorder, unspecified: Secondary | ICD-10-CM | POA: Diagnosis not present

## 2016-02-12 DIAGNOSIS — I10 Essential (primary) hypertension: Secondary | ICD-10-CM | POA: Diagnosis not present

## 2016-02-12 DIAGNOSIS — E785 Hyperlipidemia, unspecified: Secondary | ICD-10-CM | POA: Diagnosis not present

## 2016-02-13 ENCOUNTER — Ambulatory Visit
Admission: RE | Admit: 2016-02-13 | Discharge: 2016-02-13 | Disposition: A | Discharge status: Home / Self Care | Payer: BLUE CROSS/BLUE SHIELD | Source: Ambulatory Visit | Attending: Radiation Oncology | Admitting: Radiation Oncology

## 2016-02-13 ENCOUNTER — Encounter: Payer: Self-pay | Admitting: Radiation Oncology

## 2016-02-13 VITALS — BP 118/72 | HR 93 | Temp 98.4°F | Resp 16 | Ht 68.0 in | Wt 220.2 lb

## 2016-02-13 DIAGNOSIS — E039 Hypothyroidism, unspecified: Secondary | ICD-10-CM | POA: Diagnosis not present

## 2016-02-13 DIAGNOSIS — F17200 Nicotine dependence, unspecified, uncomplicated: Secondary | ICD-10-CM | POA: Diagnosis not present

## 2016-02-13 DIAGNOSIS — E785 Hyperlipidemia, unspecified: Secondary | ICD-10-CM | POA: Diagnosis not present

## 2016-02-13 DIAGNOSIS — C50411 Malignant neoplasm of upper-outer quadrant of right female breast: Secondary | ICD-10-CM | POA: Diagnosis not present

## 2016-02-13 DIAGNOSIS — K219 Gastro-esophageal reflux disease without esophagitis: Secondary | ICD-10-CM | POA: Diagnosis not present

## 2016-02-13 DIAGNOSIS — F329 Major depressive disorder, single episode, unspecified: Secondary | ICD-10-CM | POA: Diagnosis not present

## 2016-02-13 DIAGNOSIS — Z510 Encounter for antineoplastic radiation therapy: Secondary | ICD-10-CM | POA: Diagnosis not present

## 2016-02-13 DIAGNOSIS — F419 Anxiety disorder, unspecified: Secondary | ICD-10-CM | POA: Diagnosis not present

## 2016-02-13 DIAGNOSIS — I10 Essential (primary) hypertension: Secondary | ICD-10-CM | POA: Diagnosis not present

## 2016-02-13 DIAGNOSIS — Z170 Estrogen receptor positive status [ER+]: Secondary | ICD-10-CM | POA: Diagnosis not present

## 2016-02-13 MED ORDER — RADIAPLEXRX EX GEL
Freq: Once | CUTANEOUS | Status: AC
  Administered 2016-02-13: 09:00:00 via TOPICAL

## 2016-02-13 NOTE — Progress Notes (Signed)
  Radiation Oncology         [phone]   Name: Tracy Hart MRN: [id number]   Date: 02/13/2016  DOB: 05/12/1961   Weekly Radiation Therapy Management    ICD-9-CM ICD-10-CM   1. Breast cancer of upper-outer quadrant of right female breast ([facility]) 174.4 C50.411 hyaluronate sodium (RADIAPLEXRX) gel    Current Dose: 27 Gy  Planned Dose:  60.4 Gy  Narrative The patient presents for routine under treatment assessment.  Weekly rad txs to to the right breast 15 completed. Mild erythema ,skin intact. Using Radiaplex gel bid. No c/o fatigue or pain. Appetite is good.  Set-up films were reviewed. The chart was checked.  Physical Findings  height is 5\' 8"  (1.727 m) and weight is 220 lb 3.2 oz (99.882 kg). Her oral temperature is 98.4 F (36.9 C). Her blood pressure is 118/72 and her pulse is 93. Her respiration is 16 and oxygen saturation is 99%. . Weight essentially stable. Hyperpigmentation, no desquamation, and no ulceration of the right breast.  Impression The patient is tolerating radiation.  Plan Continue treatment as planned.      Sheral Apley Tammi Klippel, M.D.  This document serves as a record of services personally performed by Tyler Pita, MD. It was created on his behalf by Darcus Austin, a trained medical scribe. The creation of this record is based on the scribe's personal observations and the provider's statements to them. This document has been checked and approved by the attending provider.

## 2016-02-13 NOTE — Progress Notes (Addendum)
Weekly rad txs Right breast 15 completed, mild erythema ,skin intact.  Using Radiaplex gel bid. No c/o fatigue or pain.  Appetite is good. Wt Readings from Last 3 Encounters:  02/13/16 220 lb 3.2 oz (99.882 kg)  02/04/16 218 lb (98.884 kg)  01/30/16 219 lb 11.2 oz (99.655 kg)  BP 118/72 mmHg  Pulse 93  Temp(Src) 98.4 F (36.9 C) (Oral)  Resp 16  Ht 5\' 8"  (1.727 m)  Wt 220 lb 3.2 oz (99.882 kg)  BMI 33.49 kg/m2  SpO2 99%

## 2016-02-16 ENCOUNTER — Ambulatory Visit
Admission: RE | Admit: 2016-02-16 | Discharge: 2016-02-16 | Disposition: A | Discharge status: Home / Self Care | Payer: BLUE CROSS/BLUE SHIELD | Source: Ambulatory Visit | Attending: Radiation Oncology | Admitting: Radiation Oncology

## 2016-02-16 DIAGNOSIS — E039 Hypothyroidism, unspecified: Secondary | ICD-10-CM | POA: Diagnosis not present

## 2016-02-16 DIAGNOSIS — K219 Gastro-esophageal reflux disease without esophagitis: Secondary | ICD-10-CM | POA: Diagnosis not present

## 2016-02-16 DIAGNOSIS — Z510 Encounter for antineoplastic radiation therapy: Secondary | ICD-10-CM | POA: Diagnosis not present

## 2016-02-16 DIAGNOSIS — Z170 Estrogen receptor positive status [ER+]: Secondary | ICD-10-CM | POA: Diagnosis not present

## 2016-02-16 DIAGNOSIS — F329 Major depressive disorder, single episode, unspecified: Secondary | ICD-10-CM | POA: Diagnosis not present

## 2016-02-16 DIAGNOSIS — F419 Anxiety disorder, unspecified: Secondary | ICD-10-CM | POA: Diagnosis not present

## 2016-02-16 DIAGNOSIS — I10 Essential (primary) hypertension: Secondary | ICD-10-CM | POA: Diagnosis not present

## 2016-02-16 DIAGNOSIS — C50411 Malignant neoplasm of upper-outer quadrant of right female breast: Secondary | ICD-10-CM | POA: Diagnosis not present

## 2016-02-16 DIAGNOSIS — E785 Hyperlipidemia, unspecified: Secondary | ICD-10-CM | POA: Diagnosis not present

## 2016-02-16 DIAGNOSIS — F17200 Nicotine dependence, unspecified, uncomplicated: Secondary | ICD-10-CM | POA: Diagnosis not present

## 2016-02-17 ENCOUNTER — Ambulatory Visit
Admission: RE | Admit: 2016-02-17 | Discharge: 2016-02-17 | Disposition: A | Discharge status: Home / Self Care | Payer: BLUE CROSS/BLUE SHIELD | Source: Ambulatory Visit | Attending: Radiation Oncology | Admitting: Radiation Oncology

## 2016-02-17 DIAGNOSIS — Z170 Estrogen receptor positive status [ER+]: Secondary | ICD-10-CM | POA: Diagnosis not present

## 2016-02-17 DIAGNOSIS — F17200 Nicotine dependence, unspecified, uncomplicated: Secondary | ICD-10-CM | POA: Diagnosis not present

## 2016-02-17 DIAGNOSIS — I10 Essential (primary) hypertension: Secondary | ICD-10-CM | POA: Diagnosis not present

## 2016-02-17 DIAGNOSIS — E785 Hyperlipidemia, unspecified: Secondary | ICD-10-CM | POA: Diagnosis not present

## 2016-02-17 DIAGNOSIS — F419 Anxiety disorder, unspecified: Secondary | ICD-10-CM | POA: Diagnosis not present

## 2016-02-17 DIAGNOSIS — Z510 Encounter for antineoplastic radiation therapy: Secondary | ICD-10-CM | POA: Diagnosis not present

## 2016-02-17 DIAGNOSIS — F329 Major depressive disorder, single episode, unspecified: Secondary | ICD-10-CM | POA: Diagnosis not present

## 2016-02-17 DIAGNOSIS — K219 Gastro-esophageal reflux disease without esophagitis: Secondary | ICD-10-CM | POA: Diagnosis not present

## 2016-02-17 DIAGNOSIS — C50411 Malignant neoplasm of upper-outer quadrant of right female breast: Secondary | ICD-10-CM | POA: Diagnosis not present

## 2016-02-17 DIAGNOSIS — E039 Hypothyroidism, unspecified: Secondary | ICD-10-CM | POA: Diagnosis not present

## 2016-02-18 ENCOUNTER — Ambulatory Visit
Admission: RE | Admit: 2016-02-18 | Discharge: 2016-02-18 | Disposition: A | Discharge status: Home / Self Care | Payer: BLUE CROSS/BLUE SHIELD | Source: Ambulatory Visit | Attending: Radiation Oncology | Admitting: Radiation Oncology

## 2016-02-18 DIAGNOSIS — Z170 Estrogen receptor positive status [ER+]: Secondary | ICD-10-CM | POA: Diagnosis not present

## 2016-02-18 DIAGNOSIS — F419 Anxiety disorder, unspecified: Secondary | ICD-10-CM | POA: Diagnosis not present

## 2016-02-18 DIAGNOSIS — I10 Essential (primary) hypertension: Secondary | ICD-10-CM | POA: Diagnosis not present

## 2016-02-18 DIAGNOSIS — F329 Major depressive disorder, single episode, unspecified: Secondary | ICD-10-CM | POA: Diagnosis not present

## 2016-02-18 DIAGNOSIS — E785 Hyperlipidemia, unspecified: Secondary | ICD-10-CM | POA: Diagnosis not present

## 2016-02-18 DIAGNOSIS — E039 Hypothyroidism, unspecified: Secondary | ICD-10-CM | POA: Diagnosis not present

## 2016-02-18 DIAGNOSIS — C50411 Malignant neoplasm of upper-outer quadrant of right female breast: Secondary | ICD-10-CM | POA: Diagnosis not present

## 2016-02-18 DIAGNOSIS — F17200 Nicotine dependence, unspecified, uncomplicated: Secondary | ICD-10-CM | POA: Diagnosis not present

## 2016-02-18 DIAGNOSIS — K219 Gastro-esophageal reflux disease without esophagitis: Secondary | ICD-10-CM | POA: Diagnosis not present

## 2016-02-18 DIAGNOSIS — Z510 Encounter for antineoplastic radiation therapy: Secondary | ICD-10-CM | POA: Diagnosis not present

## 2016-02-19 ENCOUNTER — Ambulatory Visit: Payer: BLUE CROSS/BLUE SHIELD

## 2016-02-19 ENCOUNTER — Ambulatory Visit
Admission: RE | Admit: 2016-02-19 | Discharge: 2016-02-19 | Disposition: A | Discharge status: Home / Self Care | Payer: BLUE CROSS/BLUE SHIELD | Source: Ambulatory Visit | Attending: Radiation Oncology | Admitting: Radiation Oncology

## 2016-02-19 ENCOUNTER — Encounter: Payer: Self-pay | Admitting: Radiation Oncology

## 2016-02-19 DIAGNOSIS — F329 Major depressive disorder, single episode, unspecified: Secondary | ICD-10-CM | POA: Diagnosis not present

## 2016-02-19 DIAGNOSIS — E039 Hypothyroidism, unspecified: Secondary | ICD-10-CM | POA: Diagnosis not present

## 2016-02-19 DIAGNOSIS — F419 Anxiety disorder, unspecified: Secondary | ICD-10-CM | POA: Diagnosis not present

## 2016-02-19 DIAGNOSIS — Z170 Estrogen receptor positive status [ER+]: Secondary | ICD-10-CM | POA: Diagnosis not present

## 2016-02-19 DIAGNOSIS — F17200 Nicotine dependence, unspecified, uncomplicated: Secondary | ICD-10-CM | POA: Diagnosis not present

## 2016-02-19 DIAGNOSIS — K219 Gastro-esophageal reflux disease without esophagitis: Secondary | ICD-10-CM | POA: Diagnosis not present

## 2016-02-19 DIAGNOSIS — C50411 Malignant neoplasm of upper-outer quadrant of right female breast: Secondary | ICD-10-CM | POA: Diagnosis not present

## 2016-02-19 DIAGNOSIS — I10 Essential (primary) hypertension: Secondary | ICD-10-CM | POA: Diagnosis not present

## 2016-02-19 DIAGNOSIS — E785 Hyperlipidemia, unspecified: Secondary | ICD-10-CM | POA: Diagnosis not present

## 2016-02-19 DIAGNOSIS — Z510 Encounter for antineoplastic radiation therapy: Secondary | ICD-10-CM | POA: Diagnosis not present

## 2016-02-20 ENCOUNTER — Ambulatory Visit
Admission: RE | Admit: 2016-02-20 | Discharge: 2016-02-20 | Disposition: A | Discharge status: Home / Self Care | Payer: BLUE CROSS/BLUE SHIELD | Source: Ambulatory Visit | Attending: Radiation Oncology | Admitting: Radiation Oncology

## 2016-02-20 ENCOUNTER — Other Ambulatory Visit: Payer: Self-pay | Admitting: Radiation Oncology

## 2016-02-20 ENCOUNTER — Encounter: Payer: Self-pay | Admitting: Radiation Oncology

## 2016-02-20 ENCOUNTER — Telehealth: Payer: Self-pay | Admitting: *Deleted

## 2016-02-20 VITALS — HR 86 | Temp 98.3°F | Resp 16 | Wt 217.5 lb

## 2016-02-20 DIAGNOSIS — I10 Essential (primary) hypertension: Secondary | ICD-10-CM | POA: Diagnosis not present

## 2016-02-20 DIAGNOSIS — F329 Major depressive disorder, single episode, unspecified: Secondary | ICD-10-CM | POA: Diagnosis not present

## 2016-02-20 DIAGNOSIS — F17200 Nicotine dependence, unspecified, uncomplicated: Secondary | ICD-10-CM | POA: Diagnosis not present

## 2016-02-20 DIAGNOSIS — E039 Hypothyroidism, unspecified: Secondary | ICD-10-CM | POA: Diagnosis not present

## 2016-02-20 DIAGNOSIS — C50411 Malignant neoplasm of upper-outer quadrant of right female breast: Secondary | ICD-10-CM

## 2016-02-20 DIAGNOSIS — Z510 Encounter for antineoplastic radiation therapy: Secondary | ICD-10-CM | POA: Diagnosis not present

## 2016-02-20 DIAGNOSIS — E785 Hyperlipidemia, unspecified: Secondary | ICD-10-CM | POA: Diagnosis not present

## 2016-02-20 DIAGNOSIS — F419 Anxiety disorder, unspecified: Secondary | ICD-10-CM | POA: Diagnosis not present

## 2016-02-20 DIAGNOSIS — K219 Gastro-esophageal reflux disease without esophagitis: Secondary | ICD-10-CM | POA: Diagnosis not present

## 2016-02-20 DIAGNOSIS — Z170 Estrogen receptor positive status [ER+]: Secondary | ICD-10-CM | POA: Diagnosis not present

## 2016-02-20 NOTE — Progress Notes (Signed)
Department of Radiation Oncology  Phone:  [phone] Fax:        [phone]  Weekly Treatment Note    Name: Tracy Hart Date: 02/20/2016 MRN: [id number] DOB: 1960-12-18   Diagnosis:     ICD-9-CM ICD-10-CM   1. Breast cancer of upper-outer quadrant of right female breast ([place]) 174.4 C50.411      Current dose: 34.2 Gy  Current fraction: 19   MEDICATIONS: Current Outpatient Prescriptions  Medication Sig Dispense Refill  . ALPRAZolam (XANAX) 0.5 MG tablet Take 0.25 mg by mouth at bedtime as needed for anxiety. Reported on 02/04/2016    . atorvastatin (LIPITOR) 40 MG tablet Take 40 mg by mouth daily.    . calcium carbonate (OS-CAL) 600 MG TABS tablet Take 600 mg by mouth 2 (two) times daily with a meal. Reported on 01/07/2016    . cetirizine (ZYRTEC) 10 MG tablet Take 10 mg by mouth daily.    . cholecalciferol (VITAMIN D) 400 UNITS TABS tablet Take 800 Units by mouth 2 (two) times daily.    Marland Kitchen escitalopram (LEXAPRO) 10 MG tablet Take 10 mg by mouth daily.    . fluticasone (FLONASE) 50 MCG/ACT nasal spray Place 1 spray into both nostrils daily. Reported on 11/25/2015    . gabapentin (NEURONTIN) 300 MG capsule Take 1 capsule (300 mg total) by mouth 3 (three) times daily. 30 capsule 3  . levothyroxine (SYNTHROID, LEVOTHROID) 137 MCG tablet Take 137 mcg by mouth daily before breakfast.    . lidocaine-prilocaine (EMLA) cream Apply 1 application topically as needed. 30 g 3  . non-metallic deodorant (ALRA) MISC Apply 1 application topically daily as needed.    Marland Kitchen olmesartan-hydrochlorothiazide (BENICAR HCT) 40-25 MG per tablet Take 1 tablet by mouth daily. Reported on 02/13/2016    . Omega-3 Fatty Acids (FISH OIL) 1200 MG CAPS Take by mouth 2 (two) times daily. Reported on 02/13/2016    . traZODone (DESYREL) 50 MG tablet Take 50 mg by mouth at bedtime. Reported on 12/02/2015    . Wound Cleansers (RADIAPLEX EX) Apply topically.    Marland Kitchen aspirin 81 MG tablet Take 81 mg by mouth daily. Reported  on 02/20/2016     No current facility-administered medications for this encounter.     ALLERGIES: Codeine and Sulfa antibiotics   LABORATORY DATA:  Lab Results  Component Value Date   WBC 16.4* 12/24/2015   HGB 10.8* 12/24/2015   HCT 32.2* 12/24/2015   MCV 103.2* 12/24/2015   PLT 212 12/24/2015   Lab Results  Component Value Date   NA 137 12/24/2015   K 4.5 12/24/2015   CL 105 09/26/2015   CO2 25 12/24/2015   Lab Results  Component Value Date   ALT 27 12/24/2015   AST 19 12/24/2015   ALKPHOS 90 12/24/2015   BILITOT <0.30 12/24/2015     NARRATIVE: Tracy Hart was seen today for weekly treatment management. The chart was checked and the patient's films were reviewed.  Weekly rd tx right breast 19/33  Mild erythema,   Skin intact, radioplex bid using, appetite good,  Exercising,walking more  10:16 AM Pulse 86  Temp(Src) 98.3 F (36.8 C) (Oral)  Resp 16  Wt 217 lb 8 oz (98.657 kg)  Wt Readings from Last 3 Encounters:  02/20/16 217 lb 8 oz (98.657 kg)  02/13/16 220 lb 3.2 oz (99.882 kg)  02/04/16 218 lb (98.884 kg)    PHYSICAL EXAMINATION: weight is 217 lb 8 oz (98.657 kg). Her oral temperature is 98.3  F (36.8 C). Her pulse is 86. Her respiration is 16.        ASSESSMENT: The patient is doing satisfactorily with treatment.  PLAN: We will continue with the patient's radiation treatment as planned.

## 2016-02-20 NOTE — Progress Notes (Signed)
Weekly rd tx right breast 19/33  Mild erythema,   Skin intact, radioplex bid using, appetite good,  Exercising,walking more  8:59 AM Pulse 86  Temp(Src) 98.3 F (36.8 C) (Oral)  Resp 16  Wt 217 lb 8 oz (98.657 kg)  Wt Readings from Last 3 Encounters:  02/20/16 217 lb 8 oz (98.657 kg)  02/13/16 220 lb 3.2 oz (99.882 kg)  02/04/16 218 lb (98.884 kg)

## 2016-02-20 NOTE — Telephone Encounter (Signed)
Called patient to inform of appt. with  Genetic person- Tracy Hart on 03-31-16 @ 10 am, spoke with patient and she is aware of this appt.

## 2016-02-23 ENCOUNTER — Ambulatory Visit: Payer: BLUE CROSS/BLUE SHIELD

## 2016-02-25 ENCOUNTER — Ambulatory Visit
Admission: RE | Admit: 2016-02-25 | Discharge: 2016-02-25 | Disposition: A | Discharge status: Home / Self Care | Payer: BLUE CROSS/BLUE SHIELD | Source: Ambulatory Visit | Attending: Radiation Oncology | Admitting: Radiation Oncology

## 2016-02-25 DIAGNOSIS — C50411 Malignant neoplasm of upper-outer quadrant of right female breast: Secondary | ICD-10-CM | POA: Diagnosis not present

## 2016-02-25 DIAGNOSIS — Z510 Encounter for antineoplastic radiation therapy: Secondary | ICD-10-CM | POA: Diagnosis not present

## 2016-02-25 DIAGNOSIS — F17200 Nicotine dependence, unspecified, uncomplicated: Secondary | ICD-10-CM | POA: Diagnosis not present

## 2016-02-25 DIAGNOSIS — E039 Hypothyroidism, unspecified: Secondary | ICD-10-CM | POA: Diagnosis not present

## 2016-02-25 DIAGNOSIS — K219 Gastro-esophageal reflux disease without esophagitis: Secondary | ICD-10-CM | POA: Diagnosis not present

## 2016-02-25 DIAGNOSIS — F329 Major depressive disorder, single episode, unspecified: Secondary | ICD-10-CM | POA: Diagnosis not present

## 2016-02-25 DIAGNOSIS — Z170 Estrogen receptor positive status [ER+]: Secondary | ICD-10-CM | POA: Diagnosis not present

## 2016-02-25 DIAGNOSIS — E785 Hyperlipidemia, unspecified: Secondary | ICD-10-CM | POA: Diagnosis not present

## 2016-02-25 DIAGNOSIS — F419 Anxiety disorder, unspecified: Secondary | ICD-10-CM | POA: Diagnosis not present

## 2016-02-25 DIAGNOSIS — I10 Essential (primary) hypertension: Secondary | ICD-10-CM | POA: Diagnosis not present

## 2016-02-26 ENCOUNTER — Ambulatory Visit
Admission: RE | Admit: 2016-02-26 | Discharge: 2016-02-26 | Disposition: A | Discharge status: Home / Self Care | Payer: BLUE CROSS/BLUE SHIELD | Source: Ambulatory Visit | Attending: Radiation Oncology | Admitting: Radiation Oncology

## 2016-02-26 DIAGNOSIS — F419 Anxiety disorder, unspecified: Secondary | ICD-10-CM | POA: Diagnosis not present

## 2016-02-26 DIAGNOSIS — K219 Gastro-esophageal reflux disease without esophagitis: Secondary | ICD-10-CM | POA: Diagnosis not present

## 2016-02-26 DIAGNOSIS — I10 Essential (primary) hypertension: Secondary | ICD-10-CM | POA: Diagnosis not present

## 2016-02-26 DIAGNOSIS — F17200 Nicotine dependence, unspecified, uncomplicated: Secondary | ICD-10-CM | POA: Diagnosis not present

## 2016-02-26 DIAGNOSIS — F329 Major depressive disorder, single episode, unspecified: Secondary | ICD-10-CM | POA: Diagnosis not present

## 2016-02-26 DIAGNOSIS — E039 Hypothyroidism, unspecified: Secondary | ICD-10-CM | POA: Diagnosis not present

## 2016-02-26 DIAGNOSIS — Z510 Encounter for antineoplastic radiation therapy: Secondary | ICD-10-CM | POA: Diagnosis not present

## 2016-02-26 DIAGNOSIS — C50411 Malignant neoplasm of upper-outer quadrant of right female breast: Secondary | ICD-10-CM | POA: Diagnosis not present

## 2016-02-26 DIAGNOSIS — E785 Hyperlipidemia, unspecified: Secondary | ICD-10-CM | POA: Diagnosis not present

## 2016-02-26 DIAGNOSIS — Z170 Estrogen receptor positive status [ER+]: Secondary | ICD-10-CM | POA: Diagnosis not present

## 2016-02-27 ENCOUNTER — Ambulatory Visit
Admission: RE | Admit: 2016-02-27 | Discharge: 2016-02-27 | Disposition: A | Discharge status: Home / Self Care | Payer: BLUE CROSS/BLUE SHIELD | Source: Ambulatory Visit | Attending: Radiation Oncology | Admitting: Radiation Oncology

## 2016-02-27 ENCOUNTER — Encounter: Payer: Self-pay | Admitting: Radiation Oncology

## 2016-02-27 ENCOUNTER — Ambulatory Visit: Payer: BLUE CROSS/BLUE SHIELD | Admitting: Radiation Oncology

## 2016-02-27 VITALS — BP 125/70 | HR 83 | Temp 97.9°F | Resp 20 | Wt 216.5 lb

## 2016-02-27 DIAGNOSIS — F329 Major depressive disorder, single episode, unspecified: Secondary | ICD-10-CM | POA: Diagnosis not present

## 2016-02-27 DIAGNOSIS — E039 Hypothyroidism, unspecified: Secondary | ICD-10-CM | POA: Diagnosis not present

## 2016-02-27 DIAGNOSIS — F17200 Nicotine dependence, unspecified, uncomplicated: Secondary | ICD-10-CM | POA: Diagnosis not present

## 2016-02-27 DIAGNOSIS — I10 Essential (primary) hypertension: Secondary | ICD-10-CM | POA: Diagnosis not present

## 2016-02-27 DIAGNOSIS — Z510 Encounter for antineoplastic radiation therapy: Secondary | ICD-10-CM | POA: Diagnosis not present

## 2016-02-27 DIAGNOSIS — E785 Hyperlipidemia, unspecified: Secondary | ICD-10-CM | POA: Diagnosis not present

## 2016-02-27 DIAGNOSIS — C50411 Malignant neoplasm of upper-outer quadrant of right female breast: Secondary | ICD-10-CM

## 2016-02-27 DIAGNOSIS — K219 Gastro-esophageal reflux disease without esophagitis: Secondary | ICD-10-CM | POA: Diagnosis not present

## 2016-02-27 DIAGNOSIS — F419 Anxiety disorder, unspecified: Secondary | ICD-10-CM | POA: Diagnosis not present

## 2016-02-27 DIAGNOSIS — Z170 Estrogen receptor positive status [ER+]: Secondary | ICD-10-CM | POA: Diagnosis not present

## 2016-02-27 NOTE — Progress Notes (Signed)
Weekly  Left breast  22/33 completed erythema,skin intact, using radiaplex bid,  Sore ness under axilla getting 2 tratments today,  8:05 AM

## 2016-02-27 NOTE — Progress Notes (Signed)
Department of Radiation Oncology  Phone:  [phone] Fax:        [phone]  Weekly Treatment Note    Name: Tracy Hart Date: 02/27/2016 MRN: [id number] DOB: 1961/08/12   Diagnosis:     ICD-9-CM ICD-10-CM   1. Breast cancer of upper-outer quadrant of right female breast ([place]) 174.4 C50.411      Current dose: 39.6 Gy  Current fraction: 22   MEDICATIONS: Current Outpatient Prescriptions  Medication Sig Dispense Refill  . ALPRAZolam (XANAX) 0.5 MG tablet Take 0.25 mg by mouth at bedtime as needed for anxiety. Reported on 02/04/2016    . aspirin 81 MG tablet Take 81 mg by mouth daily. Reported on 02/20/2016    . atorvastatin (LIPITOR) 40 MG tablet Take 40 mg by mouth daily.    . calcium carbonate (OS-CAL) 600 MG TABS tablet Take 600 mg by mouth 2 (two) times daily with a meal. Reported on 01/07/2016    . cetirizine (ZYRTEC) 10 MG tablet Take 10 mg by mouth daily.    . cholecalciferol (VITAMIN D) 400 UNITS TABS tablet Take 800 Units by mouth 2 (two) times daily.    Marland Kitchen escitalopram (LEXAPRO) 10 MG tablet Take 10 mg by mouth daily.    . fluticasone (FLONASE) 50 MCG/ACT nasal spray Place 1 spray into both nostrils daily. Reported on 11/25/2015    . gabapentin (NEURONTIN) 300 MG capsule Take 1 capsule (300 mg total) by mouth 3 (three) times daily. 30 capsule 3  . levothyroxine (SYNTHROID, LEVOTHROID) 137 MCG tablet Take 137 mcg by mouth daily before breakfast.    . lidocaine-prilocaine (EMLA) cream Apply 1 application topically as needed. 30 g 3  . non-metallic deodorant (ALRA) MISC Apply 1 application topically daily as needed.    Marland Kitchen olmesartan-hydrochlorothiazide (BENICAR HCT) 40-25 MG per tablet Take 1 tablet by mouth daily. Reported on 02/13/2016    . Omega-3 Fatty Acids (FISH OIL) 1200 MG CAPS Take by mouth 2 (two) times daily. Reported on 02/13/2016    . traZODone (DESYREL) 50 MG tablet Take 50 mg by mouth at bedtime. Reported on 12/02/2015    . Wound Cleansers (RADIAPLEX EX)  Apply topically.     No current facility-administered medications for this encounter.     ALLERGIES: Codeine and Sulfa antibiotics   LABORATORY DATA:  Lab Results  Component Value Date   WBC 16.4* 12/24/2015   HGB 10.8* 12/24/2015   HCT 32.2* 12/24/2015   MCV 103.2* 12/24/2015   PLT 212 12/24/2015   Lab Results  Component Value Date   NA 137 12/24/2015   K 4.5 12/24/2015   CL 105 09/26/2015   CO2 25 12/24/2015   Lab Results  Component Value Date   ALT 27 12/24/2015   AST 19 12/24/2015   ALKPHOS 90 12/24/2015   BILITOT <0.30 12/24/2015     NARRATIVE: Tracy Hart was seen today for weekly treatment management. The chart was checked and the patient's films were reviewed.  She denies fatigue. She does note her fingernails are falling off secondary to chemotherapy. She will begin using her lotion 3x daliy as advised by the nurse. Weekly Left breast 22/33 completed erythema, skin intact, using radiaplex bid. Soreness under axilla. Getting 2 treatments today, the second is scheduled at 2:10pm.    8:46 AM BP 125/70 mmHg  Pulse 83  Temp(Src) 97.9 F (36.6 C) (Oral)  Resp 20  Wt 216 lb 8 oz (98.204 kg)  Wt Readings from Last 3 Encounters:  02/27/16 216 lb  8 oz (98.204 kg)  02/20/16 217 lb 8 oz (98.657 kg)  02/13/16 220 lb 3.2 oz (99.882 kg)    PHYSICAL EXAMINATION: weight is 216 lb 8 oz (98.204 kg). Her oral temperature is 97.9 F (36.6 C). Her blood pressure is 125/70 and her pulse is 83. Her respiration is 20.        Mild erythema in the treatment area without any desquamation.   ASSESSMENT: The patient is doing satisfactorily with treatment.  PLAN: We will continue with the patient's radiation treatment as planned.      ------------------------------------------------  Jodelle Gross, MD, PhD  This document serves as a record of services personally performed by [name], MD. It was created on his behalf by Arlyce Harman, a trained medical scribe. The  creation of this record is based on the scribe's personal observations and the provider's statements to them. This document has been checked and approved by the attending provider.

## 2016-03-01 ENCOUNTER — Ambulatory Visit
Admission: RE | Admit: 2016-03-01 | Discharge: 2016-03-01 | Disposition: A | Discharge status: Home / Self Care | Payer: BLUE CROSS/BLUE SHIELD | Source: Ambulatory Visit | Attending: Radiation Oncology | Admitting: Radiation Oncology

## 2016-03-01 DIAGNOSIS — I10 Essential (primary) hypertension: Secondary | ICD-10-CM | POA: Diagnosis not present

## 2016-03-01 DIAGNOSIS — C50411 Malignant neoplasm of upper-outer quadrant of right female breast: Secondary | ICD-10-CM | POA: Diagnosis not present

## 2016-03-01 DIAGNOSIS — E785 Hyperlipidemia, unspecified: Secondary | ICD-10-CM | POA: Diagnosis not present

## 2016-03-01 DIAGNOSIS — Z170 Estrogen receptor positive status [ER+]: Secondary | ICD-10-CM | POA: Diagnosis not present

## 2016-03-01 DIAGNOSIS — F419 Anxiety disorder, unspecified: Secondary | ICD-10-CM | POA: Diagnosis not present

## 2016-03-01 DIAGNOSIS — F329 Major depressive disorder, single episode, unspecified: Secondary | ICD-10-CM | POA: Diagnosis not present

## 2016-03-01 DIAGNOSIS — K219 Gastro-esophageal reflux disease without esophagitis: Secondary | ICD-10-CM | POA: Diagnosis not present

## 2016-03-01 DIAGNOSIS — F17200 Nicotine dependence, unspecified, uncomplicated: Secondary | ICD-10-CM | POA: Diagnosis not present

## 2016-03-01 DIAGNOSIS — E039 Hypothyroidism, unspecified: Secondary | ICD-10-CM | POA: Diagnosis not present

## 2016-03-01 DIAGNOSIS — Z510 Encounter for antineoplastic radiation therapy: Secondary | ICD-10-CM | POA: Diagnosis not present

## 2016-03-02 ENCOUNTER — Ambulatory Visit
Admission: RE | Admit: 2016-03-02 | Discharge: 2016-03-02 | Disposition: A | Discharge status: Home / Self Care | Payer: BLUE CROSS/BLUE SHIELD | Source: Ambulatory Visit | Attending: Radiation Oncology | Admitting: Radiation Oncology

## 2016-03-02 DIAGNOSIS — F17200 Nicotine dependence, unspecified, uncomplicated: Secondary | ICD-10-CM | POA: Diagnosis not present

## 2016-03-02 DIAGNOSIS — C50411 Malignant neoplasm of upper-outer quadrant of right female breast: Secondary | ICD-10-CM | POA: Diagnosis not present

## 2016-03-02 DIAGNOSIS — F419 Anxiety disorder, unspecified: Secondary | ICD-10-CM | POA: Diagnosis not present

## 2016-03-02 DIAGNOSIS — I10 Essential (primary) hypertension: Secondary | ICD-10-CM | POA: Diagnosis not present

## 2016-03-02 DIAGNOSIS — Z510 Encounter for antineoplastic radiation therapy: Secondary | ICD-10-CM | POA: Diagnosis not present

## 2016-03-02 DIAGNOSIS — E039 Hypothyroidism, unspecified: Secondary | ICD-10-CM | POA: Diagnosis not present

## 2016-03-02 DIAGNOSIS — F329 Major depressive disorder, single episode, unspecified: Secondary | ICD-10-CM | POA: Diagnosis not present

## 2016-03-02 DIAGNOSIS — Z170 Estrogen receptor positive status [ER+]: Secondary | ICD-10-CM | POA: Diagnosis not present

## 2016-03-02 DIAGNOSIS — K219 Gastro-esophageal reflux disease without esophagitis: Secondary | ICD-10-CM | POA: Diagnosis not present

## 2016-03-02 DIAGNOSIS — E785 Hyperlipidemia, unspecified: Secondary | ICD-10-CM | POA: Diagnosis not present

## 2016-03-03 ENCOUNTER — Ambulatory Visit
Admission: RE | Admit: 2016-03-03 | Discharge: 2016-03-03 | Disposition: A | Discharge status: Home / Self Care | Payer: BLUE CROSS/BLUE SHIELD | Source: Ambulatory Visit | Attending: Radiation Oncology | Admitting: Radiation Oncology

## 2016-03-03 DIAGNOSIS — C50411 Malignant neoplasm of upper-outer quadrant of right female breast: Secondary | ICD-10-CM | POA: Diagnosis not present

## 2016-03-03 DIAGNOSIS — F17200 Nicotine dependence, unspecified, uncomplicated: Secondary | ICD-10-CM | POA: Diagnosis not present

## 2016-03-03 DIAGNOSIS — I10 Essential (primary) hypertension: Secondary | ICD-10-CM | POA: Diagnosis not present

## 2016-03-03 DIAGNOSIS — F419 Anxiety disorder, unspecified: Secondary | ICD-10-CM | POA: Diagnosis not present

## 2016-03-03 DIAGNOSIS — Z170 Estrogen receptor positive status [ER+]: Secondary | ICD-10-CM | POA: Diagnosis not present

## 2016-03-03 DIAGNOSIS — E039 Hypothyroidism, unspecified: Secondary | ICD-10-CM | POA: Diagnosis not present

## 2016-03-03 DIAGNOSIS — F329 Major depressive disorder, single episode, unspecified: Secondary | ICD-10-CM | POA: Diagnosis not present

## 2016-03-03 DIAGNOSIS — K219 Gastro-esophageal reflux disease without esophagitis: Secondary | ICD-10-CM | POA: Diagnosis not present

## 2016-03-03 DIAGNOSIS — Z510 Encounter for antineoplastic radiation therapy: Secondary | ICD-10-CM | POA: Diagnosis not present

## 2016-03-03 DIAGNOSIS — E785 Hyperlipidemia, unspecified: Secondary | ICD-10-CM | POA: Diagnosis not present

## 2016-03-04 ENCOUNTER — Ambulatory Visit
Admission: RE | Admit: 2016-03-04 | Discharge: 2016-03-04 | Disposition: A | Discharge status: Home / Self Care | Payer: BLUE CROSS/BLUE SHIELD | Source: Ambulatory Visit | Attending: Radiation Oncology | Admitting: Radiation Oncology

## 2016-03-04 ENCOUNTER — Telehealth: Payer: Self-pay | Admitting: *Deleted

## 2016-03-04 DIAGNOSIS — E039 Hypothyroidism, unspecified: Secondary | ICD-10-CM | POA: Diagnosis not present

## 2016-03-04 DIAGNOSIS — F419 Anxiety disorder, unspecified: Secondary | ICD-10-CM | POA: Diagnosis not present

## 2016-03-04 DIAGNOSIS — I10 Essential (primary) hypertension: Secondary | ICD-10-CM | POA: Diagnosis not present

## 2016-03-04 DIAGNOSIS — E785 Hyperlipidemia, unspecified: Secondary | ICD-10-CM | POA: Diagnosis not present

## 2016-03-04 DIAGNOSIS — F17200 Nicotine dependence, unspecified, uncomplicated: Secondary | ICD-10-CM | POA: Diagnosis not present

## 2016-03-04 DIAGNOSIS — C50411 Malignant neoplasm of upper-outer quadrant of right female breast: Secondary | ICD-10-CM | POA: Diagnosis not present

## 2016-03-04 DIAGNOSIS — K219 Gastro-esophageal reflux disease without esophagitis: Secondary | ICD-10-CM | POA: Diagnosis not present

## 2016-03-04 DIAGNOSIS — F329 Major depressive disorder, single episode, unspecified: Secondary | ICD-10-CM | POA: Diagnosis not present

## 2016-03-04 DIAGNOSIS — Z170 Estrogen receptor positive status [ER+]: Secondary | ICD-10-CM | POA: Diagnosis not present

## 2016-03-04 DIAGNOSIS — Z510 Encounter for antineoplastic radiation therapy: Secondary | ICD-10-CM | POA: Diagnosis not present

## 2016-03-04 NOTE — Telephone Encounter (Signed)
  Oncology Nurse Navigator Documentation    Navigator Encounter Type: Telephone (03/04/16 1400) Telephone: Lahoma Crocker Call;Appt Confirmation/Clarification (03/04/16 1400)  Relate doing well during xrt and without complaints. Discussed having genetic counseling and testing. Denies further needs at this time.                                      Time Spent with Patient: 30 (03/04/16 1400)

## 2016-03-05 ENCOUNTER — Encounter: Payer: Self-pay | Admitting: Radiation Oncology

## 2016-03-05 ENCOUNTER — Ambulatory Visit
Admission: RE | Admit: 2016-03-05 | Discharge: 2016-03-05 | Disposition: A | Discharge status: Home / Self Care | Payer: BLUE CROSS/BLUE SHIELD | Source: Ambulatory Visit | Attending: Radiation Oncology | Admitting: Radiation Oncology

## 2016-03-05 ENCOUNTER — Ambulatory Visit: Payer: BLUE CROSS/BLUE SHIELD

## 2016-03-05 VITALS — BP 124/86 | HR 90 | Temp 98.0°F | Resp 20 | Wt 217.3 lb

## 2016-03-05 DIAGNOSIS — E785 Hyperlipidemia, unspecified: Secondary | ICD-10-CM | POA: Diagnosis not present

## 2016-03-05 DIAGNOSIS — F17200 Nicotine dependence, unspecified, uncomplicated: Secondary | ICD-10-CM | POA: Diagnosis not present

## 2016-03-05 DIAGNOSIS — K219 Gastro-esophageal reflux disease without esophagitis: Secondary | ICD-10-CM | POA: Diagnosis not present

## 2016-03-05 DIAGNOSIS — F329 Major depressive disorder, single episode, unspecified: Secondary | ICD-10-CM | POA: Diagnosis not present

## 2016-03-05 DIAGNOSIS — Z170 Estrogen receptor positive status [ER+]: Secondary | ICD-10-CM | POA: Diagnosis not present

## 2016-03-05 DIAGNOSIS — F419 Anxiety disorder, unspecified: Secondary | ICD-10-CM | POA: Diagnosis not present

## 2016-03-05 DIAGNOSIS — E039 Hypothyroidism, unspecified: Secondary | ICD-10-CM | POA: Diagnosis not present

## 2016-03-05 DIAGNOSIS — I10 Essential (primary) hypertension: Secondary | ICD-10-CM | POA: Diagnosis not present

## 2016-03-05 DIAGNOSIS — C50411 Malignant neoplasm of upper-outer quadrant of right female breast: Secondary | ICD-10-CM | POA: Diagnosis not present

## 2016-03-05 DIAGNOSIS — Z510 Encounter for antineoplastic radiation therapy: Secondary | ICD-10-CM | POA: Diagnosis not present

## 2016-03-05 NOTE — Progress Notes (Addendum)
Weekly rad tx  Right breast  28/33 completed,   Erythema, peeling on nipple and under mammary fold,  Soreness under axilla, using radaiplex bid, appetite good 9:03 AM BP 124/86 mmHg  Pulse 90  Temp(Src) 98 F (36.7 C) (Oral)  Resp 20  Wt 217 lb 4.8 oz (98.567 kg)

## 2016-03-06 ENCOUNTER — Ambulatory Visit: Payer: BLUE CROSS/BLUE SHIELD

## 2016-03-06 NOTE — Progress Notes (Signed)
Department of Radiation Oncology  Phone:  [phone] Fax:        [phone]  Weekly Treatment Note    Name: Tracy Hart Date: 03/06/2016 MRN: [id number] DOB: May 19, 1961   Diagnosis:     ICD-9-CM ICD-10-CM   1. Breast cancer of upper-outer quadrant of right female breast ([place]) 174.4 C50.411      Current dose: 50.4 Gy  Current fraction: 28   MEDICATIONS: Current Outpatient Prescriptions  Medication Sig Dispense Refill  . ALPRAZolam (XANAX) 0.5 MG tablet Take 0.25 mg by mouth at bedtime as needed for anxiety. Reported on 02/04/2016    . aspirin 81 MG tablet Take 81 mg by mouth daily. Reported on 02/20/2016    . atorvastatin (LIPITOR) 40 MG tablet Take 40 mg by mouth daily.    . calcium carbonate (OS-CAL) 600 MG TABS tablet Take 600 mg by mouth 2 (two) times daily with a meal. Reported on 01/07/2016    . cetirizine (ZYRTEC) 10 MG tablet Take 10 mg by mouth daily.    . cholecalciferol (VITAMIN D) 400 UNITS TABS tablet Take 800 Units by mouth 2 (two) times daily.    Marland Kitchen escitalopram (LEXAPRO) 10 MG tablet Take 10 mg by mouth daily.    . fluticasone (FLONASE) 50 MCG/ACT nasal spray Place 1 spray into both nostrils daily. Reported on 11/25/2015    . gabapentin (NEURONTIN) 300 MG capsule Take 1 capsule (300 mg total) by mouth 3 (three) times daily. 30 capsule 3  . levothyroxine (SYNTHROID, LEVOTHROID) 137 MCG tablet Take 137 mcg by mouth daily before breakfast.    . lidocaine-prilocaine (EMLA) cream Apply 1 application topically as needed. 30 g 3  . non-metallic deodorant (ALRA) MISC Apply 1 application topically daily as needed.    Marland Kitchen olmesartan-hydrochlorothiazide (BENICAR HCT) 40-25 MG per tablet Take 1 tablet by mouth daily. Reported on 02/13/2016    . Omega-3 Fatty Acids (FISH OIL) 1200 MG CAPS Take by mouth 2 (two) times daily. Reported on 02/13/2016    . traZODone (DESYREL) 50 MG tablet Take 50 mg by mouth at bedtime. Reported on 12/02/2015    . Wound Cleansers (RADIAPLEX EX)  Apply topically.     No current facility-administered medications for this encounter.     ALLERGIES: Codeine and Sulfa antibiotics   LABORATORY DATA:  Lab Results  Component Value Date   WBC 16.4* 12/24/2015   HGB 10.8* 12/24/2015   HCT 32.2* 12/24/2015   MCV 103.2* 12/24/2015   PLT 212 12/24/2015   Lab Results  Component Value Date   NA 137 12/24/2015   K 4.5 12/24/2015   CL 105 09/26/2015   CO2 25 12/24/2015   Lab Results  Component Value Date   ALT 27 12/24/2015   AST 19 12/24/2015   ALKPHOS 90 12/24/2015   BILITOT <0.30 12/24/2015     NARRATIVE: [name] was seen today for weekly treatment management. The chart was checked and the patient's films were reviewed.  Weekly rad tx  Right breast  28/33 completed,   Erythema, peeling on nipple and under mammary fold,  Soreness under axilla, using radaiplex bid, appetite good 8:48 PM BP 124/86 mmHg  Pulse 90  Temp(Src) 98 F (36.7 C) (Oral)  Resp 20  Wt 217 lb 4.8 oz (98.567 kg)  PHYSICAL EXAMINATION: weight is 217 lb 4.8 oz (98.567 kg). Her oral temperature is 98 F (36.7 C). Her blood pressure is 124/86 and her pulse is 90. Her respiration is 20.  Some erythema present. No moist desquamation.  ASSESSMENT: The patient is doing satisfactorily with treatment.  PLAN: We will continue with the patient's radiation treatment as planned.

## 2016-03-07 ENCOUNTER — Ambulatory Visit: Payer: BLUE CROSS/BLUE SHIELD

## 2016-03-08 ENCOUNTER — Ambulatory Visit: Payer: BLUE CROSS/BLUE SHIELD

## 2016-03-08 DIAGNOSIS — F419 Anxiety disorder, unspecified: Secondary | ICD-10-CM | POA: Diagnosis not present

## 2016-03-08 DIAGNOSIS — Z170 Estrogen receptor positive status [ER+]: Secondary | ICD-10-CM | POA: Diagnosis not present

## 2016-03-08 DIAGNOSIS — E785 Hyperlipidemia, unspecified: Secondary | ICD-10-CM | POA: Diagnosis not present

## 2016-03-08 DIAGNOSIS — Z510 Encounter for antineoplastic radiation therapy: Secondary | ICD-10-CM | POA: Diagnosis not present

## 2016-03-08 DIAGNOSIS — F17200 Nicotine dependence, unspecified, uncomplicated: Secondary | ICD-10-CM | POA: Diagnosis not present

## 2016-03-08 DIAGNOSIS — C50411 Malignant neoplasm of upper-outer quadrant of right female breast: Secondary | ICD-10-CM | POA: Diagnosis not present

## 2016-03-08 DIAGNOSIS — I10 Essential (primary) hypertension: Secondary | ICD-10-CM | POA: Diagnosis not present

## 2016-03-08 DIAGNOSIS — F329 Major depressive disorder, single episode, unspecified: Secondary | ICD-10-CM | POA: Diagnosis not present

## 2016-03-08 DIAGNOSIS — E039 Hypothyroidism, unspecified: Secondary | ICD-10-CM | POA: Diagnosis not present

## 2016-03-08 DIAGNOSIS — K219 Gastro-esophageal reflux disease without esophagitis: Secondary | ICD-10-CM | POA: Diagnosis not present

## 2016-03-09 ENCOUNTER — Ambulatory Visit: Payer: BLUE CROSS/BLUE SHIELD

## 2016-03-09 DIAGNOSIS — Z510 Encounter for antineoplastic radiation therapy: Secondary | ICD-10-CM | POA: Diagnosis not present

## 2016-03-09 DIAGNOSIS — F17200 Nicotine dependence, unspecified, uncomplicated: Secondary | ICD-10-CM | POA: Diagnosis not present

## 2016-03-09 DIAGNOSIS — E039 Hypothyroidism, unspecified: Secondary | ICD-10-CM | POA: Diagnosis not present

## 2016-03-09 DIAGNOSIS — I10 Essential (primary) hypertension: Secondary | ICD-10-CM | POA: Diagnosis not present

## 2016-03-09 DIAGNOSIS — Z170 Estrogen receptor positive status [ER+]: Secondary | ICD-10-CM | POA: Diagnosis not present

## 2016-03-09 DIAGNOSIS — F419 Anxiety disorder, unspecified: Secondary | ICD-10-CM | POA: Diagnosis not present

## 2016-03-09 DIAGNOSIS — C50411 Malignant neoplasm of upper-outer quadrant of right female breast: Secondary | ICD-10-CM | POA: Diagnosis not present

## 2016-03-09 DIAGNOSIS — E785 Hyperlipidemia, unspecified: Secondary | ICD-10-CM | POA: Diagnosis not present

## 2016-03-09 DIAGNOSIS — F329 Major depressive disorder, single episode, unspecified: Secondary | ICD-10-CM | POA: Diagnosis not present

## 2016-03-09 DIAGNOSIS — K219 Gastro-esophageal reflux disease without esophagitis: Secondary | ICD-10-CM | POA: Diagnosis not present

## 2016-03-10 ENCOUNTER — Ambulatory Visit
Admission: RE | Admit: 2016-03-10 | Discharge: 2016-03-10 | Disposition: A | Discharge status: Home / Self Care | Payer: BLUE CROSS/BLUE SHIELD | Source: Ambulatory Visit | Attending: Radiation Oncology | Admitting: Radiation Oncology

## 2016-03-10 ENCOUNTER — Ambulatory Visit: Payer: BLUE CROSS/BLUE SHIELD

## 2016-03-10 DIAGNOSIS — E785 Hyperlipidemia, unspecified: Secondary | ICD-10-CM | POA: Diagnosis not present

## 2016-03-10 DIAGNOSIS — F329 Major depressive disorder, single episode, unspecified: Secondary | ICD-10-CM | POA: Diagnosis not present

## 2016-03-10 DIAGNOSIS — Z510 Encounter for antineoplastic radiation therapy: Secondary | ICD-10-CM | POA: Diagnosis not present

## 2016-03-10 DIAGNOSIS — Z170 Estrogen receptor positive status [ER+]: Secondary | ICD-10-CM | POA: Diagnosis not present

## 2016-03-10 DIAGNOSIS — E039 Hypothyroidism, unspecified: Secondary | ICD-10-CM | POA: Diagnosis not present

## 2016-03-10 DIAGNOSIS — F419 Anxiety disorder, unspecified: Secondary | ICD-10-CM | POA: Diagnosis not present

## 2016-03-10 DIAGNOSIS — C50411 Malignant neoplasm of upper-outer quadrant of right female breast: Secondary | ICD-10-CM | POA: Diagnosis not present

## 2016-03-10 DIAGNOSIS — F17200 Nicotine dependence, unspecified, uncomplicated: Secondary | ICD-10-CM | POA: Diagnosis not present

## 2016-03-10 DIAGNOSIS — K219 Gastro-esophageal reflux disease without esophagitis: Secondary | ICD-10-CM | POA: Diagnosis not present

## 2016-03-10 DIAGNOSIS — I10 Essential (primary) hypertension: Secondary | ICD-10-CM | POA: Diagnosis not present

## 2016-03-11 ENCOUNTER — Telehealth: Payer: Self-pay | Admitting: Oncology

## 2016-03-11 ENCOUNTER — Ambulatory Visit: Payer: BLUE CROSS/BLUE SHIELD

## 2016-03-11 ENCOUNTER — Ambulatory Visit
Admission: RE | Admit: 2016-03-11 | Discharge: 2016-03-11 | Disposition: A | Discharge status: Home / Self Care | Payer: BLUE CROSS/BLUE SHIELD | Source: Ambulatory Visit | Attending: Radiation Oncology | Admitting: Radiation Oncology

## 2016-03-11 ENCOUNTER — Other Ambulatory Visit (HOSPITAL_BASED_OUTPATIENT_CLINIC_OR_DEPARTMENT_OTHER): Payer: BLUE CROSS/BLUE SHIELD

## 2016-03-11 ENCOUNTER — Ambulatory Visit (HOSPITAL_BASED_OUTPATIENT_CLINIC_OR_DEPARTMENT_OTHER): Payer: BLUE CROSS/BLUE SHIELD | Admitting: Oncology

## 2016-03-11 VITALS — BP 114/77 | HR 83 | Temp 98.3°F | Resp 18 | Ht 68.0 in | Wt 216.3 lb

## 2016-03-11 DIAGNOSIS — E785 Hyperlipidemia, unspecified: Secondary | ICD-10-CM | POA: Diagnosis not present

## 2016-03-11 DIAGNOSIS — C50411 Malignant neoplasm of upper-outer quadrant of right female breast: Secondary | ICD-10-CM

## 2016-03-11 DIAGNOSIS — Z510 Encounter for antineoplastic radiation therapy: Secondary | ICD-10-CM | POA: Diagnosis not present

## 2016-03-11 DIAGNOSIS — E039 Hypothyroidism, unspecified: Secondary | ICD-10-CM | POA: Diagnosis not present

## 2016-03-11 DIAGNOSIS — Z170 Estrogen receptor positive status [ER+]: Secondary | ICD-10-CM | POA: Diagnosis not present

## 2016-03-11 DIAGNOSIS — K219 Gastro-esophageal reflux disease without esophagitis: Secondary | ICD-10-CM | POA: Diagnosis not present

## 2016-03-11 DIAGNOSIS — I10 Essential (primary) hypertension: Secondary | ICD-10-CM | POA: Diagnosis not present

## 2016-03-11 DIAGNOSIS — F419 Anxiety disorder, unspecified: Secondary | ICD-10-CM | POA: Diagnosis not present

## 2016-03-11 DIAGNOSIS — F329 Major depressive disorder, single episode, unspecified: Secondary | ICD-10-CM | POA: Diagnosis not present

## 2016-03-11 DIAGNOSIS — F17200 Nicotine dependence, unspecified, uncomplicated: Secondary | ICD-10-CM | POA: Diagnosis not present

## 2016-03-11 LAB — COMPREHENSIVE METABOLIC PANEL
ALK PHOS: 95 U/L (ref 40–150)
ALT: 26 U/L (ref 0–55)
ANION GAP: 9 meq/L (ref 3–11)
AST: 24 U/L (ref 5–34)
Albumin: 4.2 g/dL (ref 3.5–5.0)
BILIRUBIN TOTAL: 0.33 mg/dL (ref 0.20–1.20)
BUN: 12.9 mg/dL (ref 7.0–26.0)
CALCIUM: 9.8 mg/dL (ref 8.4–10.4)
CO2: 26 mEq/L (ref 22–29)
CREATININE: 0.8 mg/dL (ref 0.6–1.1)
Chloride: 104 mEq/L (ref 98–109)
EGFR: 86 mL/min/{1.73_m2} — ABNORMAL LOW (ref >90)
Glucose: 86 mg/dl (ref 70–140)
Potassium: 4.5 mEq/L (ref 3.5–5.1)
Sodium: 139 mEq/L (ref 136–145)
TOTAL PROTEIN: 7.4 g/dL (ref 6.4–8.3)

## 2016-03-11 LAB — CBC WITH DIFFERENTIAL/PLATELET
BASO%: 0.8 % (ref 0.0–2.0)
BASOS ABS: 0.1 10*3/uL (ref 0.0–0.1)
EOS ABS: 0.4 10*3/uL (ref 0.0–0.5)
EOS%: 7 % (ref 0.0–7.0)
HEMATOCRIT: 39 % (ref 34.8–46.6)
HEMOGLOBIN: 13.5 g/dL (ref 11.6–15.9)
LYMPH%: 21.9 % (ref 14.0–49.7)
MCH: 33.9 pg (ref 25.1–34.0)
MCHC: 34.5 g/dL (ref 31.5–36.0)
MCV: 98.2 fL (ref 79.5–101.0)
MONO#: 0.6 10*3/uL (ref 0.1–0.9)
MONO%: 10.4 % (ref 0.0–14.0)
NEUT#: 3.6 10*3/uL (ref 1.5–6.5)
NEUT%: 59.9 % (ref 38.4–76.8)
PLATELETS: 217 10*3/uL (ref 145–400)
RBC: 3.97 10*6/uL (ref 3.70–5.45)
RDW: 12.8 % (ref 11.2–14.5)
WBC: 6 10*3/uL (ref 3.9–10.3)
lymph#: 1.3 10*3/uL (ref 0.9–3.3)

## 2016-03-11 MED ORDER — GABAPENTIN 300 MG PO CAPS: 300.0000 mg | ORAL_CAPSULE | Freq: Three times a day (TID) | ORAL | Status: DC

## 2016-03-11 NOTE — Progress Notes (Signed)
[facility]  [phone]) [phone] Fax:(336) [phone]   ID: Tracy Hart DOB: [birth year]  MR#: [id number]  PPI#:[id number]  Patient Care Team: Prince Solian, MD as PCP - General (Internal Medicine) [name] [name], MD as Consulting Physician (Obstetrics and Gynecology) [name], MD as Consulting Physician (Oncology) [name], MD as Consulting Physician (General Surgery) [name], MD as Consulting Physician (Radiation Oncology) PCP: [name], MD OTHER MD:  CHIEF COMPLAINT: Estrogen receptor positive breast cancer  CURRENT TREATMENT:  BREAST CANCER HISTORY: From the original intake note:  "Tracy Hart" had routine screening mammography at Dr. [name] office late November, showing a possible mass in the right breast. On 08/04/2015 she underwent right diagnostic mammography with tomosynthesis and right breast ultrasonography at the breast Center. The breast density was category C. In the upper outer quadrant of the right breast there was a 0.8 cm area of asymmetry which was not palpable by exam. Ultrasound showed no suspicious mass or calcifications.  Biopsy of the area of asymmetry was performed 08/19/2015, and showed ([id number]) invasive and in situ ductal carcinoma, E-cadherin positive, estrogen receptor 90% positive, progesterone receptor 90% positive, both with strong staining intensity, with an MIB-1 of 5%, and no HER-2 amplification, the signals ratio being 1.44 and the number per cell 1.80.  The patient was then referred to surgery and after appropriate discussion underwent right lumpectomy and sentinel lymph node sampling 09/01/2015. The pathology from this procedure ([id number]) confirmed an invasive ductal carcinoma, grade 2, measuring 1.7 cm, with some low-grade ductal carcinoma in situ. Margins were negative. All 5 sentinel lymph nodes were clear.  The patient's subsequent history is as detailed below.  INTERVAL HISTORY: Tracy Hart returns  today for follow-up of her estrogen receptor positive breast cancer. She will complete her radiation treatments tomorrow. She has done remarkably well. She has continued to work and is exercising regularly, chiefly by walking 45-60 minutes 5 days a week.  She tells me her 60 year old sister has also been found to have breast cancer. That makes 3 in the immediate family.  REVIEW OF SYSTEMS: Tracy Hart is experiencing multiple menopausal symptoms including weight gain, hot flashes, insomnia, and feeling old and 16. The gabapentin at bedtime is helping. She's also been found to be prediabetic she says or at least she is concerned about that and she is on a low carb diet with her husband who is a diabetic. Her hair is coming back. Aside from these issues a detailed review of systems today was stable.  PAST MEDICAL HISTORY: Past Medical History  Diagnosis Date  . Cancer [facility]) 2016    right breast  . Breast cancer ([place]) 08/19/15    right breast  . Anxiety     takes Xanax daily as needed  . Hypertension     takes Benicar daily  . Hypothyroidism     takes Synthroid daily  . Depression     takes Lexapro daily  . Seasonal allergies     takes Zyrtec and uses Flonase daily  . Hyperlipidemia     takes Atorvastatin daily  . History of bronchitis 2000  . History of hiatal hernia   . GERD (gastroesophageal reflux disease)   . Insomnia     takes Trazodone nightly    PAST SURGICAL HISTORY: Past Surgical History  Procedure Laterality Date  . Breast lumpectomy with radioactive seed and sentinel lymph node biopsy Right 09/01/2015    Procedure: RIGHT BREAST LUMPECTOMY WITH RADIOACTIVE SEED LOCALIZED AND SENTINEL LYMPH NODE BIOPSY;  Surgeon: [name], MD;  Location: [facility];  Service: General;  Laterality: Right;  . Tonsillectomy  at age 20  . Hernia repair Right 2009  . Esophagogastroduodenoscopy    . Colonoscopy    . Portacath placement Left 09/29/2015    Procedure: INSERTION  PORT-A-CATH;  Surgeon: [name], MD;  Location: [facility] OR;  Service: General;  Laterality: Left;    FAMILY HISTORY Family History  Problem Relation Age of Onset  . Cancer Mother 28    breast  . Diabetes Mother   . Hypertension Mother   . Hypertension Father   . Cancer Sister 3    thyroid  . Seizures Sister    The patient's parents are in their early 40s as of January 2017. The patient had no brothers, 3 sisters. The patient's mother was diagnosed with breast cancer at age 69. The patient's sister was diagnosed with thyroid cancer at age 48. There is no history of ovarian cancer in the family.   GYNECOLOGIC HISTORY:  No LMP recorded. Patient is postmenopausal. Menarche age 37, the patient is GX P0. She was on oral contraceptives between 1996 and 2016, and at the time of her diagnosis had been on hormone replacement for 2 months.   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 Her husband [name]") works in Occupational hygienist for a Technical sales engineer. At home is just the 2 of them, with no pets. The patient at tends [place]    ADVANCED DIRECTIVES: In place. The patient's husband is her healthcare power of attorney with her sister [name] listed as secondary   HEALTH MAINTENANCE: Social History  Substance Use Topics  . Smoking status: Former Research scientist (life sciences)  . Smokeless tobacco: Not on file     Comment: quit smoking around 1990  . Alcohol Use: No     Colonoscopy: 2010/[facility]  PAP: November 2016  Bone density: Due/ at Dr [name]  Lipid panel:  Allergies  Allergen Reactions  . Codeine     No percocet, makes patient weird  . Sulfa Antibiotics Other (See Comments)    Caused mouth sores    Current Outpatient Prescriptions  Medication Sig Dispense Refill  . ALPRAZolam (XANAX) 0.5 MG tablet Take 0.25 mg by mouth at bedtime as needed for anxiety. Reported on 02/04/2016    . aspirin 81 MG tablet Take 81 mg by mouth daily.  Reported on 02/20/2016    . atorvastatin (LIPITOR) 40 MG tablet Take 40 mg by mouth daily.    . calcium carbonate (OS-CAL) 600 MG TABS tablet Take 600 mg by mouth 2 (two) times daily with a meal. Reported on 01/07/2016    . cetirizine (ZYRTEC) 10 MG tablet Take 10 mg by mouth daily.    . cholecalciferol (VITAMIN D) 400 UNITS TABS tablet Take 800 Units by mouth 2 (two) times daily.    Marland Kitchen escitalopram (LEXAPRO) 10 MG tablet Take 10 mg by mouth daily.    . fluticasone (FLONASE) 50 MCG/ACT nasal spray Place 1 spray into both nostrils daily. Reported on 11/25/2015    . gabapentin (NEURONTIN) 300 MG capsule Take 1 capsule (300 mg total) by mouth 3 (three) times daily. 30 capsule 3  . levothyroxine (SYNTHROID, LEVOTHROID) 137 MCG tablet Take 137 mcg by mouth daily before breakfast.    . olmesartan-hydrochlorothiazide (BENICAR HCT) 40-25 MG per tablet Take 1 tablet by mouth daily. Reported on 02/13/2016    . Omega-3 Fatty  Acids (FISH OIL) 1200 MG CAPS Take by mouth 2 (two) times daily. Reported on 02/13/2016    . traZODone (DESYREL) 50 MG tablet Take 50 mg by mouth at bedtime. Reported on 12/02/2015     No current facility-administered medications for this visit.    OBJECTIVE: Middle-aged white woman  Filed Vitals:   03/11/16 1055  BP: 114/77  Pulse: 83  Temp: 98.3 F (36.8 C)  Resp: 18     Body mass index is 32.9 kg/(m^2).    ECOG FS:1 - Symptomatic but completely ambulatory  Sclerae unicteric, EOMs intact Oropharynx clear and moist No cervical or supraclavicular adenopathy Lungs no rales or rhonchi Heart regular rate and rhythm Abd soft, nontender, positive bowel sounds MSK no focal spinal tenderness, no upper extremity lymphedema Neuro: nonfocal, well oriented, appropriate affect Breasts: The right breast shows erythema but no desquamation. There is no palpable mass. The right axilla is benign. The left breast is unremarkable    LAB RESULTS:  CMP     Component Value Date/Time   NA 137  12/24/2015 0959   NA 139 09/26/2015 0836   K 4.5 12/24/2015 0959   K 4.1 09/26/2015 0836   CL 105 09/26/2015 0836   CO2 25 12/24/2015 0959   CO2 28 09/26/2015 0836   GLUCOSE 106 12/24/2015 0959   GLUCOSE 109* 09/26/2015 0836   BUN 10.5 12/24/2015 0959   BUN 15 09/26/2015 0836   CREATININE 0.8 12/24/2015 0959   CREATININE 0.82 09/26/2015 0836   CALCIUM 9.1 12/24/2015 0959   CALCIUM 9.8 09/26/2015 0836   PROT 5.8* 12/24/2015 0959   ALBUMIN 3.5 12/24/2015 0959   AST 19 12/24/2015 0959   ALT 27 12/24/2015 0959   ALKPHOS 90 12/24/2015 0959   BILITOT <0.30 12/24/2015 0959   GFRNONAA >60 09/26/2015 0836   GFRAA >60 09/26/2015 0836    INo results found for: SPEP, UPEP  Lab Results  Component Value Date   WBC 6.0 03/11/2016   NEUTROABS 3.6 03/11/2016   HGB 13.5 03/11/2016   HCT 39.0 03/11/2016   MCV 98.2 03/11/2016   PLT 217 03/11/2016      Chemistry      Component Value Date/Time   NA 137 12/24/2015 0959   NA 139 09/26/2015 0836   K 4.5 12/24/2015 0959   K 4.1 09/26/2015 0836   CL 105 09/26/2015 0836   CO2 25 12/24/2015 0959   CO2 28 09/26/2015 0836   BUN 10.5 12/24/2015 0959   BUN 15 09/26/2015 0836   CREATININE 0.8 12/24/2015 0959   CREATININE 0.82 09/26/2015 0836      Component Value Date/Time   CALCIUM 9.1 12/24/2015 0959   CALCIUM 9.8 09/26/2015 0836   ALKPHOS 90 12/24/2015 0959   AST 19 12/24/2015 0959   ALT 27 12/24/2015 0959   BILITOT <0.30 12/24/2015 0959       No results found for: LABCA2  No components found for: DJSHF026  No results for input(s): INR in the last 168 hours.  Urinalysis    Component Value Date/Time   LABSPEC 1.005 10/14/2015 0933   PHURINE 6.0 10/14/2015 0933   GLUCOSEU Negative 10/14/2015 0933   HGBUR Negative 10/14/2015 0933   BILIRUBINUR Negative 10/14/2015 0933   KETONESUR Negative 10/14/2015 0933   PROTEINUR Negative 10/14/2015 0933   UROBILINOGEN 0.2 10/14/2015 0933   NITRITE Negative 10/14/2015 0933    LEUKOCYTESUR Negative 10/14/2015 0933    STUDIES: No results found.   ASSESSMENT: 56 y.o. status post right breast  upper outer quadrant biopsy 08/19/2015 for a clinical T1b N0, stage IA invasive ductal carcinoma, estrogen and progesterone receptor positive, HER-2 not amplified, with an MIB-1 of 5%  (1) status post right lumpectomy and sentinel lymph node sampling 09/01/2015 for a pT1c pN0, stage IA invasive ductal carcinoma, grade 2, with negative margins; repeat HER-2 again negative  (2) Oncotype DX score of 23 falls in the intermediate range, and predicts a risk of recurrence outside the breast within 10 years of 15%, if the patient's only systemic therapy is tamoxifen for 5 years. It also predicts an absolute risk reduction of 4% with added chemotherapy (11% risk)  (3) cyclophosphamide and docetaxel x 4, given every 21 days with neulasta onpro started 10/06/15, completed 12/16/2015  (4) adjuvant radiation to be completed 03/12/2016  (5) tamoxifen to start 03/23/2016  (a) bone density pending   (6) genetics testing scheduled for 03/31/2016  PLAN: Tracy Hart will complete her local treatment tomorrow. She is now ready to start anti-estrogens.   Today we discussed the difference between tamoxifen and anastrozole in detail. She understands that anastrozole and the aromatase inhibitors in general work by blocking estrogen production. Accordingly vaginal dryness, decrease in bone density, and of course hot flashes can result. The aromatase inhibitors can also negatively affect the cholesterol profile, although that is a minor effect. One out of 5 women on aromatase inhibitors we will feel "old and achy". This arthralgia/myalgia syndrome, which resembles fibromyalgia clinically, does resolve with stopping the medications. Accordingly this is not a reason to not try an aromatase inhibitor but it is a frequent reason to stop it (in other words 20% of women will not be able to tolerate these  medications).  Tamoxifen on the other hand does not block estrogen production. It does not "take away a woman's estrogen". It blocks the estrogen receptor in breast cells. Like anastrozole, it can also cause hot flashes. As opposed to anastrozole, tamoxifen has many estrogen-like effects. It is technically an estrogen receptor modulator. This means that in some tissues tamoxifen works like estrogen-- for example it helps strengthen the bones. It tends to improve the cholesterol profile. It can cause thickening of the endometrial lining, and even endometrial polyps or rarely cancer of the uterus.(The risk of uterine cancer due to tamoxifen is one additional cancer per thousand women year). It can cause vaginal wetness or stickiness. It can cause blood clots through this estrogen-like effect--the risk of blood clots with tamoxifen is exactly the same as with birth control pills or hormone replacement.  Neither of these agents causes mood changes or weight gain, despite the popular belief that they can have these side effects. We have data from studies comparing either of these drugs with placebo, and in those cases the control group had the same amount of weight gain and depression as the group that took the drug.  After much discussion we decided particularly given that she is already feeling "old and achy" that we would start with tamoxifen. She is going to see me again in October and if she is tolerating it well at that point we will start seeing her on an every six-month basis for the next year or so.  She is due for bone density sometime this year and she will arrange for that.  She is scheduled for genetics testing and she had many questions regarding his. She understands if she did carry a BRCA mutation we would recommend bilateral salpingo-oophorectomy and also yearly MRIs in addition to yearly mammography.  This study chief changes. She is concerned because she tells me her sister has been told if  she carries a mutation she has to have bilateral mastectomies. That is not correct and we reviewed that again today.  Tracy Hart will call me with any problems that may develop before her next visit here.   [name], MD   03/11/2016 11:20 AM

## 2016-03-11 NOTE — Telephone Encounter (Signed)
appt made and avs printed °

## 2016-03-12 ENCOUNTER — Ambulatory Visit: Payer: BLUE CROSS/BLUE SHIELD

## 2016-03-12 ENCOUNTER — Ambulatory Visit
Admission: RE | Admit: 2016-03-12 | Discharge: 2016-03-12 | Disposition: A | Discharge status: Home / Self Care | Payer: BLUE CROSS/BLUE SHIELD | Source: Ambulatory Visit | Attending: Radiation Oncology | Admitting: Radiation Oncology

## 2016-03-12 ENCOUNTER — Telehealth: Payer: Self-pay | Admitting: *Deleted

## 2016-03-12 ENCOUNTER — Encounter: Payer: Self-pay | Admitting: Radiation Oncology

## 2016-03-12 VITALS — BP 132/83 | HR 88 | Temp 98.3°F | Resp 16 | Wt 218.9 lb

## 2016-03-12 DIAGNOSIS — C50411 Malignant neoplasm of upper-outer quadrant of right female breast: Secondary | ICD-10-CM

## 2016-03-12 DIAGNOSIS — Z170 Estrogen receptor positive status [ER+]: Secondary | ICD-10-CM | POA: Diagnosis not present

## 2016-03-12 DIAGNOSIS — Z510 Encounter for antineoplastic radiation therapy: Secondary | ICD-10-CM | POA: Diagnosis not present

## 2016-03-12 DIAGNOSIS — K219 Gastro-esophageal reflux disease without esophagitis: Secondary | ICD-10-CM | POA: Diagnosis not present

## 2016-03-12 DIAGNOSIS — E785 Hyperlipidemia, unspecified: Secondary | ICD-10-CM | POA: Diagnosis not present

## 2016-03-12 DIAGNOSIS — I10 Essential (primary) hypertension: Secondary | ICD-10-CM | POA: Diagnosis not present

## 2016-03-12 DIAGNOSIS — F419 Anxiety disorder, unspecified: Secondary | ICD-10-CM | POA: Diagnosis not present

## 2016-03-12 DIAGNOSIS — F17200 Nicotine dependence, unspecified, uncomplicated: Secondary | ICD-10-CM | POA: Diagnosis not present

## 2016-03-12 DIAGNOSIS — F329 Major depressive disorder, single episode, unspecified: Secondary | ICD-10-CM | POA: Diagnosis not present

## 2016-03-12 DIAGNOSIS — E039 Hypothyroidism, unspecified: Secondary | ICD-10-CM | POA: Diagnosis not present

## 2016-03-12 NOTE — Telephone Encounter (Signed)
  Oncology Nurse Navigator Documentation    Navigator Encounter Type: Telephone (03/12/16 1400) Telephone: Outgoing Call (03/12/16 1400)         Patient Visit Type: [id number] (03/12/16 1400) Treatment Phase: Final Radiation Tx (03/12/16 1400) Barriers/Navigation Needs: No barriers at this time;No Questions;No Needs (03/12/16 1400)   Interventions: Referrals (03/12/16 1400) Referrals: Survivorship (03/12/16 1400)          Acuity: Level 1 (03/12/16 1400)         Time Spent with Patient: 15 (03/12/16 1400)

## 2016-03-12 NOTE — Progress Notes (Signed)
Weekly  Rad txs   Right breast 33/33 completed,  Erythema and dryness under axilla and under inframmary fold, skin healing jgave another radaiplex gel, 1 month follow up appt with Shona Simpson PA, no pain 9:08 AM BP 132/83 mmHg  Pulse 88  Temp(Src) 98.3 F (36.8 C) (Oral)  Resp 16  Wt 218 lb 14.4 oz (99.292 kg)  Wt Readings from Last 3 Encounters:  03/12/16 218 lb 14.4 oz (99.292 kg)  03/11/16 216 lb 4.8 oz (98.113 kg)  03/05/16 217 lb 4.8 oz (98.567 kg)

## 2016-03-13 NOTE — Progress Notes (Signed)
Complex simulation note  Diagnosis: Right-sided breast cancer  Narrative The patient has initially been planned to receive a course of whole breast radiation to a dose of 50.4 Gy in 28 fractions. The patient will now receive an additional boost to the seroma cavity which has been contoured. This will correspond to a boost of 10 Gy at 1.8 Gy per fraction. To accomplish this, an additional 2 customized blocks have been designed for this purpose. A complex isodose plan is requested to ensure that the target area is adequately covered with radiation dose and that the nearby normal structures such as the lung are adequately spared. The patient's final total dose will be 60.4 Gy.  ------------------------------------------------  Jodelle Gross, MD, PhD

## 2016-03-13 NOTE — Progress Notes (Signed)
Department of Radiation Oncology  Phone:  [phone] Fax:        [phone]  Weekly Treatment Note    Name: Tracy Hart Date: 03/13/2016 MRN: [id number] DOB: 1961/05/17   Current dose: 60.4 Gy  Current fraction: 33   MEDICATIONS: Current Outpatient Prescriptions  Medication Sig Dispense Refill  . ALPRAZolam (XANAX) 0.5 MG tablet Take 0.25 mg by mouth at bedtime as needed for anxiety. Reported on 02/04/2016    . aspirin 81 MG tablet Take 81 mg by mouth daily. Reported on 02/20/2016    . atorvastatin (LIPITOR) 40 MG tablet Take 40 mg by mouth daily.    . calcium carbonate (OS-CAL) 600 MG TABS tablet Take 600 mg by mouth 2 (two) times daily with a meal. Reported on 01/07/2016    . cetirizine (ZYRTEC) 10 MG tablet Take 10 mg by mouth daily.    . cholecalciferol (VITAMIN D) 400 UNITS TABS tablet Take 800 Units by mouth 2 (two) times daily.    Marland Kitchen escitalopram (LEXAPRO) 10 MG tablet Take 10 mg by mouth daily.    . fluticasone (FLONASE) 50 MCG/ACT nasal spray Place 1 spray into both nostrils daily. Reported on 11/25/2015    . gabapentin (NEURONTIN) 300 MG capsule Take 1 capsule (300 mg total) by mouth 3 (three) times daily. 30 capsule 3  . levothyroxine (SYNTHROID, LEVOTHROID) 137 MCG tablet Take 137 mcg by mouth daily before breakfast.    . olmesartan-hydrochlorothiazide (BENICAR HCT) 40-25 MG per tablet Take 1 tablet by mouth daily. Reported on 02/13/2016    . Omega-3 Fatty Acids (FISH OIL) 1200 MG CAPS Take by mouth 2 (two) times daily. Reported on 02/13/2016    . traZODone (DESYREL) 50 MG tablet Take 50 mg by mouth at bedtime. Reported on 12/02/2015     No current facility-administered medications for this encounter.     ALLERGIES: Codeine and Sulfa antibiotics   LABORATORY DATA:  Lab Results  Component Value Date   WBC 6.0 03/11/2016   HGB 13.5 03/11/2016   HCT 39.0 03/11/2016   MCV 98.2 03/11/2016   PLT 217 03/11/2016   Lab Results  Component Value Date   NA 139  03/11/2016   K 4.5 03/11/2016   CL 105 09/26/2015   CO2 26 03/11/2016   Lab Results  Component Value Date   ALT 26 03/11/2016   AST 24 03/11/2016   ALKPHOS 95 03/11/2016   BILITOT 0.33 03/11/2016     NARRATIVE: Tracy Hart was seen today for weekly treatment management. The chart was checked and the patient's films were reviewed.   Weekly  Rad txs   Right breast 33/33 completed,  Erythema and dryness under axilla and under inframmary fold, skin healing jgave another radaiplex gel, 1 month follow up appt with Tracy Simpson PA, no pain 1:18 PM BP 132/83 mmHg  Pulse 88  Temp(Src) 98.3 F (36.8 C) (Oral)  Resp 16  Wt 218 lb 14.4 oz (99.292 kg)  Wt Readings from Last 3 Encounters:  03/12/16 218 lb 14.4 oz (99.292 kg)  03/11/16 216 lb 4.8 oz (98.113 kg)  03/05/16 217 lb 4.8 oz (98.567 kg)    PHYSICAL EXAMINATION: weight is 218 lb 14.4 oz (99.292 kg). Her oral temperature is 98.3 F (36.8 C). Her blood pressure is 132/83 and her pulse is 88. Her respiration is 16.      No moist desquamation. Overall the skin looks quite good for finishing treatment  ASSESSMENT: The patient did satisfactorily with treatment.  PLAN: The  patient will follow-up in our clinic in 1 month.

## 2016-03-15 ENCOUNTER — Ambulatory Visit: Payer: BLUE CROSS/BLUE SHIELD

## 2016-03-16 ENCOUNTER — Telehealth: Payer: Self-pay | Admitting: Oncology

## 2016-03-16 NOTE — Telephone Encounter (Signed)
Spoke with pt to confirm Oct appts date/times per pof

## 2016-03-19 ENCOUNTER — Other Ambulatory Visit: Payer: Self-pay | Admitting: Surgery

## 2016-03-19 ENCOUNTER — Ambulatory Visit: Payer: BLUE CROSS/BLUE SHIELD

## 2016-03-19 DIAGNOSIS — C50911 Malignant neoplasm of unspecified site of right female breast: Secondary | ICD-10-CM | POA: Diagnosis not present

## 2016-03-22 ENCOUNTER — Other Ambulatory Visit: Payer: Self-pay | Admitting: *Deleted

## 2016-03-22 MED ORDER — TAMOXIFEN CITRATE 20 MG PO TABS: 20.0000 mg | ORAL_TABLET | Freq: Every day | ORAL | 3 refills | Status: DC

## 2016-03-24 ENCOUNTER — Encounter (HOSPITAL_BASED_OUTPATIENT_CLINIC_OR_DEPARTMENT_OTHER): Payer: Self-pay | Admitting: *Deleted

## 2016-03-28 NOTE — H&P (Signed)
  Tracy Hart 03/19/2016 8:59 AM Location: [facility] Patient #: [id number] DOB: 15-Nov-1960 Married / Language: English / Race: White Female   History of Present Illness (Tracy Hart A. Ninfa Linden MD; 03/19/2016 9:06 AM) The patient is a 55 year old female who presents with breast cancer. She is here for another long-term follow-up regarding her right breast cancer. She is now completed her radiation. She is doing very well and was no complaints.   Allergies Elbert Ewings, CMA; 03/19/2016 9:00 AM) Codeine Sulfate *ANALGESICS - OPIOID* Sulfamethoxazole *Sulfonamides  Medication History Elbert Ewings, CMA; 03/19/2016 9:00 AM) TraZODone HCl (50MG  Tablet, Oral) Active. Gabapentin (300MG  Capsule, Oral) Active. Lexapro (20MG  Tablet, Oral) Active. Atorvastatin Calcium (40MG  Tablet, Oral) Active. Benicar HCT (40-25MG  Tablet, Oral) Active. Escitalopram Oxalate (10MG  Tablet, Oral) Active. Estradiol (0.05MG /24HR Patch TW, Transdermal) Active. Levothyroxine Sodium (137MCG Tablet, Oral) Active. Progesterone Micronized (100MG  Capsule, Oral) Active. Fish Oil (1200MG  Capsule, Oral) Active. Medications Reconciled  Vitals Elbert Ewings CMA; 03/19/2016 9:01 AM) 03/19/2016 9:00 AM Weight: 218 lb Height: 68in Body Surface Area: 2.12 m Body Mass Index: 33.15 kg/m  Temp.: 98.68F(Temporal)  Pulse: 72 (Regular)  BP: 126/74 (Sitting, Left Arm, Standard)  Physical Exam (Valerye Kobus A. Ninfa Linden MD; 03/19/2016 9:06 AM) The physical exam findings are as follows: Note:On examination of the right breast, there is still mild erythema from the radiation. The Port-A-Cath site is well-healed Lungs clear bilaterally CV RRR Abdomen soft, non tender Skin without erythema    Assessment & Plan (Takeisha Cianci A. Ninfa Linden MD; 03/19/2016 9:06 AM) BREAST CANCER, STAGE 1, RIGHT (C50.911) Impression: As she has completed her radiation therapy, I will now schedule her for Port-A-Cath removal. I  discussed the procedure with her in detail and she is eager to proceed. Surgery will be scheduled

## 2016-03-29 ENCOUNTER — Ambulatory Visit (HOSPITAL_BASED_OUTPATIENT_CLINIC_OR_DEPARTMENT_OTHER): Payer: BLUE CROSS/BLUE SHIELD | Admitting: Anesthesiology

## 2016-03-29 ENCOUNTER — Encounter (HOSPITAL_BASED_OUTPATIENT_CLINIC_OR_DEPARTMENT_OTHER): Payer: Self-pay

## 2016-03-29 ENCOUNTER — Ambulatory Visit (HOSPITAL_BASED_OUTPATIENT_CLINIC_OR_DEPARTMENT_OTHER)
Admission: RE | Admit: 2016-03-29 | Discharge: 2016-03-29 | Disposition: A | Discharge status: Home / Self Care | Payer: BLUE CROSS/BLUE SHIELD | Source: Ambulatory Visit | Attending: Surgery | Admitting: Surgery

## 2016-03-29 ENCOUNTER — Encounter (HOSPITAL_BASED_OUTPATIENT_CLINIC_OR_DEPARTMENT_OTHER)
Admission: RE | Disposition: A | Discharge status: Home / Self Care | Payer: Self-pay | Source: Ambulatory Visit | Attending: Surgery

## 2016-03-29 DIAGNOSIS — Z7951 Long term (current) use of inhaled steroids: Secondary | ICD-10-CM | POA: Diagnosis not present

## 2016-03-29 DIAGNOSIS — F418 Other specified anxiety disorders: Secondary | ICD-10-CM | POA: Diagnosis not present

## 2016-03-29 DIAGNOSIS — I10 Essential (primary) hypertension: Secondary | ICD-10-CM | POA: Diagnosis not present

## 2016-03-29 DIAGNOSIS — E039 Hypothyroidism, unspecified: Secondary | ICD-10-CM | POA: Insufficient documentation

## 2016-03-29 DIAGNOSIS — Z853 Personal history of malignant neoplasm of breast: Secondary | ICD-10-CM | POA: Diagnosis not present

## 2016-03-29 DIAGNOSIS — Z87891 Personal history of nicotine dependence: Secondary | ICD-10-CM | POA: Insufficient documentation

## 2016-03-29 DIAGNOSIS — Z7982 Long term (current) use of aspirin: Secondary | ICD-10-CM | POA: Diagnosis not present

## 2016-03-29 DIAGNOSIS — Z452 Encounter for adjustment and management of vascular access device: Secondary | ICD-10-CM | POA: Diagnosis not present

## 2016-03-29 DIAGNOSIS — Z79899 Other long term (current) drug therapy: Secondary | ICD-10-CM | POA: Diagnosis not present

## 2016-03-29 DIAGNOSIS — Z923 Personal history of irradiation: Secondary | ICD-10-CM | POA: Diagnosis not present

## 2016-03-29 HISTORY — PX: PORT-A-CATH REMOVAL: SHX5289

## 2016-03-29 SURGERY — REMOVAL PORT-A-CATH
Anesthesia: Monitor Anesthesia Care | Site: Chest | Laterality: Left

## 2016-03-29 MED ORDER — LACTATED RINGERS IV SOLN
INTRAVENOUS | Status: DC
  Administered 2016-03-29: 12:00:00 via INTRAVENOUS

## 2016-03-29 MED ORDER — SCOPOLAMINE 1 MG/3DAYS TD PT72: 1.0000 | MEDICATED_PATCH | Freq: Once | TRANSDERMAL | Status: DC | PRN

## 2016-03-29 MED ORDER — FENTANYL CITRATE (PF) 100 MCG/2ML IJ SOLN
50.0000 ug | INTRAMUSCULAR | Status: DC | PRN
  Administered 2016-03-29: 50 ug via INTRAVENOUS

## 2016-03-29 MED ORDER — HYDROMORPHONE HCL 1 MG/ML IJ SOLN: 0.2500 mg | INTRAMUSCULAR | Status: DC | PRN

## 2016-03-29 MED ORDER — PROMETHAZINE HCL 25 MG/ML IJ SOLN: 6.2500 mg | INTRAMUSCULAR | Status: DC | PRN

## 2016-03-29 MED ORDER — FENTANYL CITRATE (PF) 100 MCG/2ML IJ SOLN
INTRAMUSCULAR | Status: AC
  Filled 2016-03-29: qty 2

## 2016-03-29 MED ORDER — MIDAZOLAM HCL 2 MG/2ML IJ SOLN
INTRAMUSCULAR | Status: AC
  Filled 2016-03-29: qty 2

## 2016-03-29 MED ORDER — LIDOCAINE HCL (PF) 1 % IJ SOLN
INTRAMUSCULAR | Status: DC | PRN
  Administered 2016-03-29: 4 mL

## 2016-03-29 MED ORDER — CEFAZOLIN SODIUM-DEXTROSE 2-4 GM/100ML-% IV SOLN
2.0000 g | INTRAVENOUS | Status: AC
  Administered 2016-03-29: 2 g via INTRAVENOUS

## 2016-03-29 MED ORDER — PROPOFOL 10 MG/ML IV BOLUS
INTRAVENOUS | Status: DC | PRN
  Administered 2016-03-29 (×3): 20 mg via INTRAVENOUS

## 2016-03-29 MED ORDER — OXYCODONE HCL 5 MG PO TABS: 5.0000 mg | ORAL_TABLET | Freq: Once | ORAL | Status: DC | PRN

## 2016-03-29 MED ORDER — PROPOFOL 10 MG/ML IV BOLUS
INTRAVENOUS | Status: AC
  Filled 2016-03-29: qty 20

## 2016-03-29 MED ORDER — MIDAZOLAM HCL 2 MG/2ML IJ SOLN
1.0000 mg | INTRAMUSCULAR | Status: DC | PRN
  Administered 2016-03-29: 2 mg via INTRAVENOUS

## 2016-03-29 MED ORDER — CHLORHEXIDINE GLUCONATE CLOTH 2 % EX PADS: 6.0000 | MEDICATED_PAD | Freq: Once | CUTANEOUS | Status: DC

## 2016-03-29 MED ORDER — OXYCODONE HCL 5 MG/5ML PO SOLN: 5.0000 mg | Freq: Once | ORAL | Status: DC | PRN

## 2016-03-29 MED ORDER — ONDANSETRON HCL 4 MG/2ML IJ SOLN
INTRAMUSCULAR | Status: DC | PRN
  Administered 2016-03-29: 4 mg via INTRAVENOUS

## 2016-03-29 MED ORDER — ONDANSETRON HCL 4 MG/2ML IJ SOLN
INTRAMUSCULAR | Status: AC
  Filled 2016-03-29: qty 2

## 2016-03-29 MED ORDER — GLYCOPYRROLATE 0.2 MG/ML IJ SOLN: 0.2000 mg | Freq: Once | INTRAMUSCULAR | Status: DC | PRN

## 2016-03-29 MED ORDER — MEPERIDINE HCL 25 MG/ML IJ SOLN: 6.2500 mg | INTRAMUSCULAR | Status: DC | PRN

## 2016-03-29 SURGICAL SUPPLY — 36 items
BLADE HEX COATED 2.75 (ELECTRODE) ×3 IMPLANT
BLADE SURG 15 STRL LF DISP TIS (BLADE) ×1 IMPLANT
BLADE SURG 15 STRL SS (BLADE) ×2
CHLORAPREP W/TINT 26ML (MISCELLANEOUS) ×3 IMPLANT
COVER BACK TABLE 60X90IN (DRAPES) ×3 IMPLANT
COVER MAYO STAND STRL (DRAPES) ×3 IMPLANT
DECANTER SPIKE VIAL GLASS SM (MISCELLANEOUS) IMPLANT
DRAPE LAPAROTOMY 100X72 PEDS (DRAPES) ×3 IMPLANT
DRAPE UTILITY XL STRL (DRAPES) ×3 IMPLANT
DRSG TEGADERM 2-3/8X2-3/4 SM (GAUZE/BANDAGES/DRESSINGS) ×3 IMPLANT
ELECT REM PT RETURN 9FT ADLT (ELECTROSURGICAL) ×3
ELECTRODE REM PT RTRN 9FT ADLT (ELECTROSURGICAL) ×1 IMPLANT
GLOVE BIOGEL PI IND STRL 7.0 (GLOVE) ×1 IMPLANT
GLOVE BIOGEL PI INDICATOR 7.0 (GLOVE) ×2
GLOVE ECLIPSE 6.5 STRL STRAW (GLOVE) ×3 IMPLANT
GLOVE SURG SIGNA 7.5 PF LTX (GLOVE) ×3 IMPLANT
GOWN STRL REUS W/ TWL LRG LVL3 (GOWN DISPOSABLE) ×1 IMPLANT
GOWN STRL REUS W/ TWL XL LVL3 (GOWN DISPOSABLE) ×1 IMPLANT
GOWN STRL REUS W/TWL LRG LVL3 (GOWN DISPOSABLE) ×2
GOWN STRL REUS W/TWL XL LVL3 (GOWN DISPOSABLE) ×2
LIQUID BAND (GAUZE/BANDAGES/DRESSINGS) ×3 IMPLANT
NEEDLE HYPO 25X1 1.5 SAFETY (NEEDLE) ×3 IMPLANT
NS IRRIG 1000ML POUR BTL (IV SOLUTION) ×3 IMPLANT
PACK BASIN DAY SURGERY FS (CUSTOM PROCEDURE TRAY) ×3 IMPLANT
PENCIL BUTTON HOLSTER BLD 10FT (ELECTRODE) ×3 IMPLANT
SLEEVE SCD COMPRESS KNEE MED (MISCELLANEOUS) IMPLANT
SPONGE GAUZE 2X2 8PLY STER LF (GAUZE/BANDAGES/DRESSINGS) ×1
SPONGE GAUZE 2X2 8PLY STRL LF (GAUZE/BANDAGES/DRESSINGS) ×2 IMPLANT
SUT MNCRL AB 4-0 PS2 18 (SUTURE) ×3 IMPLANT
SUT SILK 2 0 SH (SUTURE) ×3 IMPLANT
SUT VIC AB 3-0 SH 27 (SUTURE) ×2
SUT VIC AB 3-0 SH 27X BRD (SUTURE) ×1 IMPLANT
SYR BULB 3OZ (MISCELLANEOUS) IMPLANT
SYR CONTROL 10ML LL (SYRINGE) ×3 IMPLANT
TOWEL OR 17X24 6PK STRL BLUE (TOWEL DISPOSABLE) ×3 IMPLANT
TOWEL OR NON WOVEN STRL DISP B (DISPOSABLE) ×3 IMPLANT

## 2016-03-29 NOTE — Anesthesia Procedure Notes (Signed)
Procedure Name: MAC Date/Time: 03/29/2016 1:08 PM Performed by: Aspin Palomarez D Pre-anesthesia Checklist: Patient identified, Emergency Drugs available, Suction available, Patient being monitored and Timeout performed Patient Re-evaluated:Patient Re-evaluated prior to inductionOxygen Delivery Method: Simple face mask Preoxygenation: Pre-oxygenation with 100% oxygen

## 2016-03-29 NOTE — Interval H&P Note (Signed)
History and Physical Interval Note: no change in H and P  03/29/2016 12:44 PM  Tracy Hart  has presented today for surgery, with the diagnosis of History of breast cancer  The various methods of treatment have been discussed with the patient and family. After consideration of risks, benefits and other options for treatment, the patient has consented to  Procedure(s): REMOVAL PORT-A-CATH (N/A) as a surgical intervention .  The patient's history has been reviewed, patient examined, no change in status, stable for surgery.  I have reviewed the patient's chart and labs.  Questions were answered to the patient's satisfaction.     [name] A

## 2016-03-29 NOTE — Anesthesia Postprocedure Evaluation (Signed)
Anesthesia Post Note  Patient: Tracy Hart  Procedure(s) Performed: Procedure(s) (LRB): REMOVAL PORT-A-CATH (Left)  Patient location during evaluation: PACU Anesthesia Type: MAC Level of consciousness: awake and alert Pain management: pain level controlled Vital Signs Assessment: post-procedure vital signs reviewed and stable Respiratory status: spontaneous breathing, nonlabored ventilation and respiratory function stable Cardiovascular status: stable and blood pressure returned to baseline Anesthetic complications: no    Last Vitals:  Vitals:   03/29/16 1345 03/29/16 1400  BP: 125/75 126/76  Pulse: 76 76  Resp: (!) 22 18  Temp:  36.8 C    Last Pain:  Vitals:   03/29/16 1400  TempSrc: Oral  PainSc: 0-No pain                 Nilda Simmer

## 2016-03-29 NOTE — Op Note (Signed)
REMOVAL PORT-A-CATH  Procedure Note  Darliene Cucinotta 03/29/2016   Pre-op Diagnosis: History of breast cancer     Post-op Diagnosis: same  Procedure(s): REMOVAL PORT-A-CATH  Surgeon(s): [name], MD  Anesthesia: Monitor Anesthesia Care  Staff:  Circulator: Tresa Res, RN Scrub Person: Silvio Clayman, CST  Estimated Blood Loss: Minimal                         Acey Woodfield A   Date: 03/29/2016  Time: 1:24 PM

## 2016-03-29 NOTE — Transfer of Care (Signed)
Immediate Anesthesia Transfer of Care Note  Patient: Tracy Hart  Procedure(s) Performed: Procedure(s): REMOVAL PORT-A-CATH (Left)  Patient Location: PACU  Anesthesia Type:MAC  Level of Consciousness: awake, alert , oriented and patient cooperative  Airway & Oxygen Therapy: Patient Spontanous Breathing and Patient connected to face mask oxygen  Post-op Assessment: Report given to RN and Post -op Vital signs reviewed and stable  Post vital signs: Reviewed and stable  Last Vitals:  Vitals:   03/29/16 1130 03/29/16 1325  BP: 134/74   Pulse: 81 80  Resp: 18 (!) 21  Temp: 36.9 C     Last Pain:  Vitals:   03/29/16 1130  TempSrc: Oral         Complications: No apparent anesthesia complications

## 2016-03-29 NOTE — Discharge Instructions (Signed)
Ok to shower starting tomorrow  Ice pack, ibuprofen, tylenol for pain    Post Anesthesia Home Care Instructions  Activity: Get plenty of rest for the remainder of the day. A responsible adult should stay with you for 24 hours following the procedure.  For the next 24 hours, DO NOT: -Drive a car -Paediatric nurse -Drink alcoholic beverages -Take any medication unless instructed by your physician -Make any legal decisions or sign important papers.  Meals: Start with liquid foods such as gelatin or soup. Progress to regular foods as tolerated. Avoid greasy, spicy, heavy foods. If nausea and/or vomiting occur, drink only clear liquids until the nausea and/or vomiting subsides. Call your physician if vomiting continues.  Special Instructions/Symptoms: Your throat may feel dry or sore from the anesthesia or the breathing tube placed in your throat during surgery. If this causes discomfort, gargle with warm salt water. The discomfort should disappear within 24 hours.  If you had a scopolamine patch placed behind your ear for the management of post- operative nausea and/or vomiting:  1. The medication in the patch is effective for 72 hours, after which it should be removed.  Wrap patch in a tissue and discard in the trash. Wash hands thoroughly with soap and water. 2. You may remove the patch earlier than 72 hours if you experience unpleasant side effects which may include dry mouth, dizziness or visual disturbances. 3. Avoid touching the patch. Wash your hands with soap and water after contact with the patch.

## 2016-03-29 NOTE — Anesthesia Preprocedure Evaluation (Signed)
Anesthesia Evaluation  Patient identified by MRN, date of birth, ID band Patient awake    Reviewed: Allergy & Precautions, NPO status , Patient's Chart, lab work & pertinent test results  Airway Mallampati: II   Neck ROM: full    Dental   Pulmonary former smoker,    breath sounds clear to auscultation       Cardiovascular hypertension,  Rhythm:regular Rate:Normal     Neuro/Psych Anxiety Depression    GI/Hepatic hiatal hernia, GERD  ,  Endo/Other  Hypothyroidism   Renal/GU      Musculoskeletal   Abdominal   Peds  Hematology   Anesthesia Other Findings   Reproductive/Obstetrics                             Anesthesia Physical  Anesthesia Plan  ASA: II  Anesthesia Plan: MAC   Post-op Pain Management:    Induction: Intravenous  Airway Management Planned:   Additional Equipment:   Intra-op Plan:   Post-operative Plan:   Informed Consent: I have reviewed the patients History and Physical, chart, labs and discussed the procedure including the risks, benefits and alternatives for the proposed anesthesia with the patient or authorized representative who has indicated his/her understanding and acceptance.   Dental advisory given  Plan Discussed with: CRNA  Anesthesia Plan Comments:         Anesthesia Quick Evaluation

## 2016-03-30 NOTE — Op Note (Signed)
[name], [name]               ACCOUNT NO.:  [id number]  MEDICAL RECORD NO.:  [id number]  LOCATION:                                 FACILITY:  PHYSICIAN:  [name], M.D. DATE OF BIRTH:  [birth year]  DATE OF PROCEDURE:  03/29/2016 DATE OF DISCHARGE:                              OPERATIVE REPORT   PREOPERATIVE DIAGNOSIS:  History of breast cancer.  POSTOPERATIVE DIAGNOSIS:  History of breast cancer.  PROCEDURES:  Port-A-Cath removal.  SURGEON:  [name], M.D.  ANESTHESIA:  1% lidocaine and monitored anesthesia care.  ESTIMATED BLOOD LOSS:  Minimal.  PROCEDURE IN DETAIL:  The patient was brought to the operating room, identified as Tracy Hart.  She was placed supine on the operating table, anesthesia was induced.  Her left chest was then prepped and draped in usual sterile fashion.  I anesthetized the skin at the previous scar over the Port-A-Cath with lidocaine.  I made incision with a scalpel.  I took this down to the port.  I then easily identify the Prolene sutures which I excised.  I then was able to easily excise the port from the pocket with the electrocautery.  I then removed the port and the catheter in its entirety.  I closed the catheter tract with a 3- 0 Vicryl figure-of-eight suture.  I then closed all the subcutaneous tissue with interrupted 3-0 Vicryl sutures, and closed the skin with a running 4-0 Monocryl.  Skin glue was then applied.  The patient tolerated the procedure well.  All the counts were correct at the end of the procedure.  The patient was then taken in a stable condition from the operating room to the recovery room.     [name], M.D.   ______________________________ [name], M.D.    DB/MEDQ  D:  03/29/2016  T:  03/29/2016  Job:  [id number]

## 2016-03-31 ENCOUNTER — Other Ambulatory Visit: Payer: BLUE CROSS/BLUE SHIELD

## 2016-03-31 ENCOUNTER — Ambulatory Visit (HOSPITAL_BASED_OUTPATIENT_CLINIC_OR_DEPARTMENT_OTHER): Payer: BLUE CROSS/BLUE SHIELD | Admitting: Genetic Counselor

## 2016-03-31 ENCOUNTER — Encounter (HOSPITAL_BASED_OUTPATIENT_CLINIC_OR_DEPARTMENT_OTHER): Payer: Self-pay | Admitting: Surgery

## 2016-03-31 DIAGNOSIS — Z315 Encounter for genetic counseling: Secondary | ICD-10-CM

## 2016-03-31 DIAGNOSIS — Z803 Family history of malignant neoplasm of breast: Secondary | ICD-10-CM | POA: Diagnosis not present

## 2016-03-31 DIAGNOSIS — Z8049 Family history of malignant neoplasm of other genital organs: Secondary | ICD-10-CM

## 2016-03-31 DIAGNOSIS — C50411 Malignant neoplasm of upper-outer quadrant of right female breast: Secondary | ICD-10-CM | POA: Diagnosis not present

## 2016-03-31 NOTE — Progress Notes (Signed)
REFERRING PROVIDER: Prince Solian, MD [place], [place] [address]   Lurline Del, MD  Shona Simpson, PA-C  PRIMARY PROVIDER:  [name], MD  PRIMARY REASON FOR VISIT:  1. Breast cancer of upper-outer quadrant of right female breast ([place])   2. Family history of breast cancer   3. Family history of uterine cancer      HISTORY OF PRESENT ILLNESS:   Tracy Hart, a 55 y.o. female, was seen for a [facility] cancer genetics consultation at the request of Dr. Doris Cheadle due to a personal and family history of cancer.  Ms. Valtierra presents to clinic today to discuss the possibility of a hereditary predisposition to cancer, genetic testing, and to further clarify her future cancer risks, as well as potential cancer risks for family members.   In 2017, at the age of 51, Ms. Sangalang was diagnosed with invasive ductal carcinoma of the right breast. This was treated with lumpectomy, four rounds of chemotherapy and radiation.  She was on her last treatment when her sister was diagnosed with breast cancer and it was brought to her attention that she met the criteria for testing.  Reportedly her sister tested negative, but it is not known what all she was tested for.   CANCER HISTORY:   No history exists.     HORMONAL RISK FACTORS:  Menarche was at age 48.  First live birth at age N/A.  OCP use for approximately 20 years.  Ovaries intact: yes.  Hysterectomy: no.  Menopausal status: perimenopausal.  HRT use: 0 years. Colonoscopy: yes; normal. Mammogram within the last year: yes. Number of breast biopsies: 1. Up to date with pelvic exams:  yes. Any excessive radiation exposure in the past:  no  Past Medical History:  Diagnosis Date  . Anxiety    takes Xanax daily as needed  . Breast cancer ([place]) 08/19/15   right breast  . Cancer ([place]) 2016   right breast  . Depression    takes Lexapro daily  . Family history of breast cancer   . Family history of uterine cancer    . GERD (gastroesophageal reflux disease)   . History of bronchitis 2000  . History of hiatal hernia   . Hyperlipidemia    takes Atorvastatin daily  . Hypertension    takes Benicar daily  . Hypothyroidism    takes Synthroid daily  . Insomnia    takes Trazodone nightly  . Seasonal allergies    takes Zyrtec and uses Flonase daily    Past Surgical History:  Procedure Laterality Date  . BREAST LUMPECTOMY WITH RADIOACTIVE SEED AND SENTINEL LYMPH NODE BIOPSY Right 09/01/2015   Procedure: RIGHT BREAST LUMPECTOMY WITH RADIOACTIVE SEED LOCALIZED AND SENTINEL LYMPH NODE BIOPSY;  Surgeon: [name], MD;  Location: [facility];  Service: General;  Laterality: Right;  . COLONOSCOPY    . ESOPHAGOGASTRODUODENOSCOPY    . HERNIA REPAIR Right 2009  . PORT-A-CATH REMOVAL Left 03/29/2016   Procedure: REMOVAL PORT-A-CATH;  Surgeon: [name], MD;  Location: [facility];  Service: General;  Laterality: Left;  . PORTACATH PLACEMENT Left 09/29/2015   Procedure: INSERTION PORT-A-CATH;  Surgeon: [name], MD;  Location: [facility];  Service: General;  Laterality: Left;  . TONSILLECTOMY  at age 71    Social History   Social History  . Marital status: Married    Spouse name: N/A  . Number of children: 0  . Years of education: N/A   Social History Main  Topics  . Smoking status: Former Smoker    Packs/day: 0.50    Types: Cigarettes    Quit date: 01/29/1989  . Smokeless tobacco: Never Used     Comment: quit smoking around 1990  . Alcohol use No  . Drug use: No  . Sexual activity: Yes    Birth control/ protection: Post-menopausal   Other Topics Concern  . None   Social History Narrative  . None     FAMILY HISTORY:  We obtained a detailed, 4-generation family history.  Significant diagnoses are listed below: Family History  Problem Relation Age of Onset  . Cancer Mother 73    breast  . Diabetes Mother   . Hypertension Mother   . Hypertension Father    . Cancer Sister 34    thyroid  . Seizures Sister   . Uterine cancer Maternal Grandmother 5  . Vaginal cancer Maternal Grandmother     dx in her 49s  . Heart disease Paternal Grandfather   . Breast cancer Sister 50    DCIS; negative genetic testing  . Colon cancer Paternal Aunt 82  . Breast cancer Cousin 58    paternal first cousin    The patient does not have children.  She has three sisters.  One sister was diagnosed with thyroid cancer at age 53, another sister was diagnosed with DCIS breast cancer at 74.  The patient's mother was diagnosed with breast cancer at age 67.  Her mother had one sister who never had cancer.  The patient's maternal grandparents are deceased.  Her grandfather committed suicide and her grandmother had uterine cancer at 57 and vaginal cancer in her 33's.  The patient's father is alive and 1.  He had 5 brothers and two sisters.  One sister had colon cancer at 23 and a brother had a daughter with breast cancer at 13.  The patient's paternal grandparents died of non cancer related issues.  Patient's maternal ancestors are of [place] descent, and paternal ancestors are of [place] descent. There is no reported Ashkenazi Jewish ancestry. There is no known consanguinity.  GENETIC COUNSELING ASSESSMENT: Malina Geers is a 55 y.o. female with a personal and family history of cancer which is somewhat suggestive of a hereditary cancer syndrome and predisposition to cancer. We, therefore, discussed and recommended the following at today's visit.   DISCUSSION:  We discussed that about 5-10% of breast cancer is hereditary, with most cases due to BRCA mutations.  Other inherited genes that can increase the risk for breast cancer, that we see relatively commonly, include PALB2, ATM and CHEK2.  Based on the early onset of uterine cancer in her grandmother we would want to look at the Lynch syndrome and PTEN genes.  We reviewed the characteristics, features and inheritance patterns of  hereditary cancer syndromes. We also discussed genetic testing, including the appropriate family members to test, the process of testing, insurance coverage and turn-around-time for results. We discussed the implications of a negative, positive and/or variant of uncertain significant result. We recommended Ms. Onofre pursue genetic testing for the Breast/ovarian cancer and Endometrial cancer gene panel - making this a custom panel.  The Custom gene panel offered by [facility] includes sequencing and rearrangement analysis for the following 22 genes:  ATM, BARD1, BRCA1, BRCA2, BRIP1, CDH1, CHEK2, EPCAM, FANCC, MLH1, MSH2, MSH6, MUTYH, NBN, PALB2, PMS2, PTEN, POLD1, RAD51C, RAD51D, TP53, and XRCC2.     Based on Ms. Huser's personal and family history of cancer, she meets medical criteria  for genetic testing. Despite that she meets criteria, she may still have an out of pocket cost. We discussed that if her out of pocket cost for testing is over $100, the laboratory will call and confirm whether she wants to proceed with testing.  If the out of pocket cost of testing is less than $100 she will be billed by the genetic testing laboratory.   During this conversation Ms. Bobby Rumpf' husband became quite upset.  He felt that we were "putting the cart before the horse" as we were going through risks and recommendations before we knew her test results.  I addressed his concerns and certainly understand where he is coming from.      PLAN: After considering the risks, benefits, and limitations, Ms. Mondesir  provided informed consent to pursue genetic testing and the blood sample was sent to [facility] for analysis of the Custom Gene panel. Results should be available within approximately 2-3 weeks' time, at which point they will be disclosed by telephone to Ms. Ottaway, as will any additional recommendations warranted by these results. Ms. Ates will receive a summary of her genetic counseling visit and a copy of her results  once available. This information will also be available in Epic. We encouraged Ms. Noble to remain in contact with cancer genetics annually so that we can continuously update the family history and inform her of any changes in cancer genetics and testing that may be of benefit for her family. Ms. Slingerland questions were answered to her satisfaction today. Our contact information was provided should additional questions or concerns arise.  Lastly, we encouraged Ms. Muenchow to remain in contact with cancer genetics annually so that we can continuously update the family history and inform her of any changes in cancer genetics and testing that may be of benefit for this family.   Ms.  Asante questions were answered to her satisfaction today. Our contact information was provided should additional questions or concerns arise. Thank you for the referral and allowing [place] to share in the care of your patient.   Lawayne Hartig P. Florene Glen, Damar, [facility] Certified Genetic Counselor [email or web address] phone: [phone]  The patient was seen for a total of 45 minutes in face-to-face genetic counseling.  This patient was discussed with Drs. Magrinat, Lindi Adie and/or Burr Medico who agrees with the above.    _______________________________________________________________________ For Office Staff:  Number of people involved in session: 2 Was an Intern/ student involved with case: no

## 2016-04-06 DIAGNOSIS — Z803 Family history of malignant neoplasm of breast: Secondary | ICD-10-CM | POA: Diagnosis not present

## 2016-04-06 DIAGNOSIS — C50411 Malignant neoplasm of upper-outer quadrant of right female breast: Secondary | ICD-10-CM | POA: Diagnosis not present

## 2016-04-06 DIAGNOSIS — Z8049 Family history of malignant neoplasm of other genital organs: Secondary | ICD-10-CM | POA: Diagnosis not present

## 2016-04-08 NOTE — Progress Notes (Signed)
°  Radiation Oncology         [phone] ________________________________  Name: Tracy Hart MRN: [id number]  Date: 03/12/2016  DOB: 18-Jan-1961  End of Treatment Note  Diagnosis:      ICD-9-CM ICD-10-CM   1. Breast cancer of upper-outer quadrant of right female breast ([place]) 174.4 C50.411        Indication for treatment:  Curative       Radiation treatment dates:   01/26/2016 to 03/12/2016  Site/dose:    1. The Right breast was treated to 50.4 Gy in 28 fractions at 1.8 Gy per fraction.  2. The Right breast was boosted to 10 Gy in 5 fractions at 2 Gy per fraction.  Beams/energy:    1. 3D // 10X, 6X 2. Isodose plan // 10X, 6X  Narrative: The patient tolerated radiation treatment relatively well.   The patient developed erythema and dryness under axilla and under inframammary fold with no moist desquamation. She had no major complaints or concerns with treatment.   Plan: The patient has completed radiation treatment. The patient will return to radiation oncology clinic for routine followup in one month. I advised them to call or return sooner if they have any questions or concerns related to their recovery or treatment.  ------------------------------------------------  Jodelle Gross, MD, PhD  This document serves as a record of services personally performed by [name], MD. It was created on his behalf by Arlyce Harman, a trained medical scribe. The creation of this record is based on the scribe's personal observations and the provider's statements to them. This document has been checked and approved by the attending provider.

## 2016-04-20 ENCOUNTER — Encounter: Payer: Self-pay | Admitting: Genetic Counselor

## 2016-04-20 ENCOUNTER — Ambulatory Visit: Payer: Self-pay | Admitting: Genetic Counselor

## 2016-04-20 ENCOUNTER — Telehealth: Payer: Self-pay | Admitting: Genetic Counselor

## 2016-04-20 ENCOUNTER — Ambulatory Visit: Payer: Self-pay | Admitting: Radiation Oncology

## 2016-04-20 DIAGNOSIS — Z8049 Family history of malignant neoplasm of other genital organs: Secondary | ICD-10-CM

## 2016-04-20 DIAGNOSIS — Z1379 Encounter for other screening for genetic and chromosomal anomalies: Secondary | ICD-10-CM | POA: Insufficient documentation

## 2016-04-20 DIAGNOSIS — C50411 Malignant neoplasm of upper-outer quadrant of right female breast: Secondary | ICD-10-CM

## 2016-04-20 DIAGNOSIS — Z803 Family history of malignant neoplasm of breast: Secondary | ICD-10-CM

## 2016-04-20 NOTE — Progress Notes (Signed)
HPI: Ms. Willis was previously seen in the [facility] clinic due to a personal and family history of cancer and concerns regarding a hereditary predisposition to cancer. Please refer to our prior cancer genetics clinic note for more information regarding Ms. Mavis's medical, social and family histories, and our assessment and recommendations, at the time. Ms. Kiper recent genetic test results were disclosed to her, as were recommendations warranted by these results. These results and recommendations are discussed in more detail below.  FAMILY HISTORY:  We obtained a detailed, 4-generation family history.  Significant diagnoses are listed below: Family History  Problem Relation Age of Onset  . Cancer Mother 90    breast  . Diabetes Mother   . Hypertension Mother   . Hypertension Father   . Cancer Sister 18    thyroid  . Seizures Sister   . Uterine cancer Maternal Grandmother 34  . Vaginal cancer Maternal Grandmother     dx in her 88s  . Heart disease Paternal Grandfather   . Breast cancer Sister 56    DCIS; negative genetic testing  . Colon cancer Paternal Aunt 82  . Breast cancer Cousin 44    paternal first cousin    The patient does not have children.  She has three sisters.  One sister was diagnosed with thyroid cancer at age 37, another sister was diagnosed with DCIS breast cancer at 47.  The patient's mother was diagnosed with breast cancer at age 11.  Her mother had one sister who never had cancer.  The patient's maternal grandparents are deceased.  Her grandfather committed suicide and her grandmother had uterine cancer at 22 and vaginal cancer in her 88's.  The patient's father is alive and 74.  He had 5 brothers and two sisters.  One sister had colon cancer at 33 and a brother had a daughter with breast cancer at 23.  The patient's paternal grandparents died of non cancer related issues.  Patient's maternal ancestors are of [place] descent, and paternal ancestors  are of [place] descent. There is no reported Ashkenazi Jewish ancestry. There is no known consanguinity.  GENETIC TEST RESULTS: At the time of Ms. Grafton visit, we recommended she pursue genetic testing of the Custom gene panel. The Custom gene panel offered by [facility] includes sequencing and rearrangement analysis for the following 22 genes:  ATM, BARD1, BRCA1, BRCA2, BRIP1, CDH1, CHEK2, EPCAM, FANCC, MLH1, MSH2, MSH6, MUTYH, NBN, PALB2, PMS2, POLD1, PTEN, RAD51C, RAD51D, TP53, and XRCC2.   The report date is April 16, 2016.  Genetic testing was normal, and did not reveal a deleterious mutation in these genes. The test report has been scanned into EPIC and is located under the Molecular Pathology section of the Results Review tab.   We discussed with Ms. Bray that since the current genetic testing is not perfect, it is possible there may be a gene mutation in one of these genes that current testing cannot detect, but that chance is small. We also discussed, that it is possible that another gene that has not yet been discovered, or that we have not yet tested, is responsible for the cancer diagnoses in the family, and it is, therefore, important to remain in touch with cancer genetics in the future so that we can continue to offer Ms. Konigsberg the most up to date genetic testing.   CANCER SCREENING RECOMMENDATIONS: This result is reassuring and indicates that Ms. Frigon likely does not have an increased risk for a future  cancer due to a mutation in one of these genes. This normal test also suggests that Ms. Bless's cancer was most likely not due to an inherited predisposition associated with one of these genes.  Most cancers happen by chance and this negative test suggests that her cancer falls into this category.  We, therefore, recommended she continue to follow the cancer management and screening guidelines provided by her oncology and primary healthcare provider.   RECOMMENDATIONS FOR FAMILY MEMBERS:  Women in this family might be at some increased risk of developing cancer, over the general population risk, simply due to the family history of cancer. We recommended women in this family have a yearly mammogram beginning at age 20, or 27 years younger than the earliest onset of cancer, an an annual clinical breast exam, and perform monthly breast self-exams. Women in this family should also have a gynecological exam as recommended by their primary provider. All family members should have a colonoscopy by age 53.  FOLLOW-UP: Lastly, we discussed with Ms. Dougher that cancer genetics is a rapidly advancing field and it is possible that new genetic tests will be appropriate for her and/or her family members in the future. We encouraged her to remain in contact with cancer genetics on an annual basis so we can update her personal and family histories and let her know of advances in cancer genetics that may benefit this family.   Our contact number was provided. Ms. Kerchner questions were answered to her satisfaction, and she knows she is welcome to call us at anytime with additional questions or concerns.   Roma Kayser, MS, [facility] Certified Genetic Counselor [email or web address]

## 2016-04-20 NOTE — Telephone Encounter (Signed)
Revealed negative genetic testing on a custom gene panel.  Explained that this does not explain why she had developed breast cancer or why there is cancer in the family.  Discussed that our technology may not be able to identify changes or possibly there is a gene we did not test for.  We will recontact her if there is a change or update in testing that she would benefit from.

## 2016-04-28 ENCOUNTER — Encounter: Payer: Self-pay | Admitting: Radiation Oncology

## 2016-04-28 ENCOUNTER — Ambulatory Visit
Admission: RE | Admit: 2016-04-28 | Discharge: 2016-04-28 | Disposition: A | Discharge status: Home / Self Care | Payer: BLUE CROSS/BLUE SHIELD | Source: Ambulatory Visit | Attending: Radiation Oncology | Admitting: Radiation Oncology

## 2016-04-28 DIAGNOSIS — C50911 Malignant neoplasm of unspecified site of right female breast: Secondary | ICD-10-CM | POA: Insufficient documentation

## 2016-04-28 DIAGNOSIS — C50411 Malignant neoplasm of upper-outer quadrant of right female breast: Secondary | ICD-10-CM

## 2016-04-28 DIAGNOSIS — Z170 Estrogen receptor positive status [ER+]: Secondary | ICD-10-CM | POA: Insufficient documentation

## 2016-04-28 NOTE — Progress Notes (Signed)
Tracy Hart is here for a one month follow up visit fo right breast cancer.  Skin looks good to right breast color normal pink, using aveena lotion daily.  Able to raise right arm without difficulty.  Started Tamoxifen 03-23-16.  Due for her mammogram in November.  Survivorship appointment scheduled for 06-21-16 with Mike Craze, PA-C and appointment with Dr. Jana Hakim the same date. Appetite is good.  Denies pain or fatigue. Wt Readings from Last 3 Encounters:  04/28/16 214 lb 3.2 oz (97.2 kg)  03/29/16 212 lb (96.2 kg)  03/12/16 218 lb 14.4 oz (99.3 kg)  BP 120/79 (BP Location: Left Arm, Patient Position: Sitting, Cuff Size: Normal)   Pulse 79   Temp 99 F (37.2 C) (Oral)   Resp 18   Ht 5\' 8"  (1.727 m)   Wt 214 lb 3.2 oz (97.2 kg)   SpO2 100%   BMI 32.57 kg/m

## 2016-04-28 NOTE — Progress Notes (Signed)
Radiation Oncology         [phone]) [phone] ________________________________  Name: Tracy Hart MRN: [id number]  Date: 04/28/2016  DOB: March 30, 1961  Post Treatment Note  CC: [name], MD  [name], MD  Diagnosis:  Stage IA, T1b, N0, M0 ER/PR positive, invasive ductal carcinoma of the right breast  Interval Since Last Radiation:  7 weeks   01/26/2016 to 03/12/2016: 1. The Right breast was treated to 50.4 Gy in 28 fractions at 1.8 Gy per fraction.  2. The Right breast was boosted to 10 Gy in 5 fractions at 2 Gy per fraction.   Narrative:  The patient returns today for routine follow-up.  She tolerated radiotherapy well and had mild hyperpigmentation at the conclusion of her treatment. She met with genetic counseling and fortunately her testing was negative for genetic predisposition to cancer.                            On review of systems, the patient states that she is doing well overall. She denies any chest pain, shortness of breath, fevers, chills, or concerns with skin changes. No other complaints are noted.  ALLERGIES:  is allergic to codeine and sulfa antibiotics.  Meds: Current Outpatient Prescriptions  Medication Sig Dispense Refill  . aspirin 81 MG tablet Take 81 mg by mouth daily. Reported on 02/20/2016    . atorvastatin (LIPITOR) 40 MG tablet Take 40 mg by mouth daily.    . cetirizine (ZYRTEC) 10 MG tablet Take 10 mg by mouth daily.    . cholecalciferol (VITAMIN D) 400 UNITS TABS tablet Take 800 Units by mouth 2 (two) times daily.    Marland Kitchen escitalopram (LEXAPRO) 10 MG tablet Take 10 mg by mouth daily.    . fluticasone (FLONASE) 50 MCG/ACT nasal spray Place 1 spray into both nostrils daily. Reported on 11/25/2015    . gabapentin (NEURONTIN) 300 MG capsule Take 1 capsule (300 mg total) by mouth 3 (three) times daily. 30 capsule 3  . levothyroxine (SYNTHROID, LEVOTHROID) 137 MCG tablet Take 137 mcg by mouth daily before breakfast.    . Multiple Vitamin (MULTIVITAMIN  WITH MINERALS) TABS tablet Take 1 tablet by mouth daily.    Marland Kitchen olmesartan-hydrochlorothiazide (BENICAR HCT) 40-25 MG per tablet Take 1 tablet by mouth daily. Reported on 02/13/2016    . tamoxifen (NOLVADEX) 20 MG tablet Take 1 tablet (20 mg total) by mouth daily. 30 tablet 3  . traZODone (DESYREL) 50 MG tablet Take 50 mg by mouth at bedtime. Reported on 12/02/2015    . ALPRAZolam (XANAX) 0.5 MG tablet Take 0.25 mg by mouth at bedtime as needed for anxiety. Reported on 02/04/2016    . Omega-3 Fatty Acids (FISH OIL) 1200 MG CAPS Take by mouth 2 (two) times daily. Reported on 02/13/2016     No current facility-administered medications for this encounter.     Physical Findings:  height is 5' 8"  (1.727 m) and weight is 214 lb 3.2 oz (97.2 kg). Her oral temperature is 99 F (37.2 C). Her blood pressure is 120/79 and her pulse is 79. Her respiration is 18 and oxygen saturation is 100%.  In general this is a well appearing caucasian female in no acute distress. She's alert and oriented x4 and appropriate throughout the examination. Cardiopulmonary assessment is negative for acute distress and she exhibits normal effort. The right breast is examined and is negative for hyperpigmentation without desquamation.  Lab Findings: Lab Results  Component Value Date   WBC 6.0 03/11/2016   HGB 13.5 03/11/2016   HCT 39.0 03/11/2016   MCV 98.2 03/11/2016   PLT 217 03/11/2016     Radiographic Findings: No results found.  Impression/Plan: 1. Stage IA, T1b, N0, M0 ER/PR positive, invasive ductal carcinoma of the right breast. The patient appears to be doing well clinically since she completed treatment. We would be happy to see her back in the future if she has questions or concerns about her previous treatment. She will continue Tamoxifen under the care of Dr. Jana Hakim. 2. Survivorship. The patient will be following up with Mike Craze, NP for survivorship evaluation in November. We reviewed the importance of  this appointment.     Carola Rhine, PAC

## 2016-05-18 DIAGNOSIS — E038 Other specified hypothyroidism: Secondary | ICD-10-CM | POA: Diagnosis not present

## 2016-05-18 DIAGNOSIS — I10 Essential (primary) hypertension: Secondary | ICD-10-CM | POA: Diagnosis not present

## 2016-05-18 DIAGNOSIS — E784 Other hyperlipidemia: Secondary | ICD-10-CM | POA: Diagnosis not present

## 2016-05-18 DIAGNOSIS — R7301 Impaired fasting glucose: Secondary | ICD-10-CM | POA: Diagnosis not present

## 2016-05-25 DIAGNOSIS — F418 Other specified anxiety disorders: Secondary | ICD-10-CM | POA: Diagnosis not present

## 2016-05-25 DIAGNOSIS — E038 Other specified hypothyroidism: Secondary | ICD-10-CM | POA: Diagnosis not present

## 2016-05-25 DIAGNOSIS — Z6832 Body mass index (BMI) 32.0-32.9, adult: Secondary | ICD-10-CM | POA: Diagnosis not present

## 2016-05-25 DIAGNOSIS — Z0000 Encounter for general adult medical examination without abnormal findings: Secondary | ICD-10-CM | POA: Diagnosis not present

## 2016-05-25 DIAGNOSIS — R7301 Impaired fasting glucose: Secondary | ICD-10-CM | POA: Diagnosis not present

## 2016-05-25 DIAGNOSIS — Z1389 Encounter for screening for other disorder: Secondary | ICD-10-CM | POA: Diagnosis not present

## 2016-05-25 DIAGNOSIS — I10 Essential (primary) hypertension: Secondary | ICD-10-CM | POA: Diagnosis not present

## 2016-05-25 DIAGNOSIS — Z1212 Encounter for screening for malignant neoplasm of rectum: Secondary | ICD-10-CM | POA: Diagnosis not present

## 2016-06-21 ENCOUNTER — Other Ambulatory Visit: Payer: Self-pay

## 2016-06-21 ENCOUNTER — Ambulatory Visit (HOSPITAL_BASED_OUTPATIENT_CLINIC_OR_DEPARTMENT_OTHER): Payer: Self-pay | Admitting: Adult Health

## 2016-06-21 ENCOUNTER — Other Ambulatory Visit (HOSPITAL_BASED_OUTPATIENT_CLINIC_OR_DEPARTMENT_OTHER): Payer: BLUE CROSS/BLUE SHIELD

## 2016-06-21 ENCOUNTER — Ambulatory Visit (HOSPITAL_BASED_OUTPATIENT_CLINIC_OR_DEPARTMENT_OTHER): Payer: BLUE CROSS/BLUE SHIELD | Admitting: Oncology

## 2016-06-21 VITALS — BP 147/75 | HR 88 | Temp 98.1°F | Resp 17 | Wt 214.8 lb

## 2016-06-21 DIAGNOSIS — C50411 Malignant neoplasm of upper-outer quadrant of right female breast: Secondary | ICD-10-CM

## 2016-06-21 DIAGNOSIS — Z170 Estrogen receptor positive status [ER+]: Secondary | ICD-10-CM | POA: Diagnosis not present

## 2016-06-21 LAB — COMPREHENSIVE METABOLIC PANEL
ALBUMIN: 3.8 g/dL (ref 3.5–5.0)
ALK PHOS: 102 U/L (ref 40–150)
ALT: 29 U/L (ref 0–55)
AST: 29 U/L (ref 5–34)
Anion Gap: 7 mEq/L (ref 3–11)
BUN: 15.5 mg/dL (ref 7.0–26.0)
CALCIUM: 9.5 mg/dL (ref 8.4–10.4)
CHLORIDE: 106 meq/L (ref 98–109)
CO2: 27 mEq/L (ref 22–29)
CREATININE: 0.8 mg/dL (ref 0.6–1.1)
EGFR: 82 mL/min/{1.73_m2} — ABNORMAL LOW (ref >90)
GLUCOSE: 105 mg/dL (ref 70–140)
Potassium: 4.5 mEq/L (ref 3.5–5.1)
Sodium: 140 mEq/L (ref 136–145)
Total Bilirubin: 0.33 mg/dL (ref 0.20–1.20)
Total Protein: 7.5 g/dL (ref 6.4–8.3)

## 2016-06-21 LAB — CBC WITH DIFFERENTIAL/PLATELET
BASO%: 1 % (ref 0.0–2.0)
Basophils Absolute: 0.1 10*3/uL (ref 0.0–0.1)
EOS%: 2.6 % (ref 0.0–7.0)
Eosinophils Absolute: 0.1 10*3/uL (ref 0.0–0.5)
HEMATOCRIT: 38.7 % (ref 34.8–46.6)
HEMOGLOBIN: 13.3 g/dL (ref 11.6–15.9)
LYMPH#: 1.5 10*3/uL (ref 0.9–3.3)
LYMPH%: 27 % (ref 14.0–49.7)
MCH: 34.8 pg — ABNORMAL HIGH (ref 25.1–34.0)
MCHC: 34.3 g/dL (ref 31.5–36.0)
MCV: 101.3 fL — ABNORMAL HIGH (ref 79.5–101.0)
MONO#: 0.5 10*3/uL (ref 0.1–0.9)
MONO%: 8.2 % (ref 0.0–14.0)
NEUT#: 3.5 10*3/uL (ref 1.5–6.5)
NEUT%: 61.2 % (ref 38.4–76.8)
Platelets: 213 10*3/uL (ref 145–400)
RBC: 3.82 10*6/uL (ref 3.70–5.45)
RDW: 12.7 % (ref 11.2–14.5)
WBC: 5.7 10*3/uL (ref 3.9–10.3)

## 2016-06-21 NOTE — Progress Notes (Signed)
[facility]  [phone] Fax:([phone]) [phone]   ID: Tracy Hart DOB: 12/02/1960  MR#: [id number]  SWF#:[id number]  Patient Care Team: Prince Solian, MD as PCP - General (Internal Medicine) [name] [name], MD as Consulting Physician (Obstetrics and Gynecology) [name], MD as Consulting Physician (Oncology) [name], MD as Consulting Physician (General Surgery) [name], MD as Consulting Physician (Radiation Oncology) PCP: [name], MD OTHER MD:  CHIEF COMPLAINT: Estrogen receptor positive breast cancer  CURRENT TREATMENT:  BREAST CANCER HISTORY: From the original intake note:  "Tracy Hart" had routine screening mammography at Dr. [name] office late November, showing a possible mass in the right breast. On 08/04/2015 she underwent right diagnostic mammography with tomosynthesis and right breast ultrasonography at the breast Center. The breast density was category C. In the upper outer quadrant of the right breast there was a 0.8 cm area of asymmetry which was not palpable by exam. Ultrasound showed no suspicious mass or calcifications.  Biopsy of the area of asymmetry was performed 08/19/2015, and showed ([id number]) invasive and in situ ductal carcinoma, E-cadherin positive, estrogen receptor 90% positive, progesterone receptor 90% positive, both with strong staining intensity, with an MIB-1 of 5%, and no HER-2 amplification, the signals ratio being 1.44 and the number per cell 1.80.  The patient was then referred to surgery and after appropriate discussion underwent right lumpectomy and sentinel lymph node sampling 09/01/2015. The pathology from this procedure ([id number]) confirmed an invasive ductal carcinoma, grade 2, measuring 1.7 cm, with some low-grade ductal carcinoma in situ. Margins were negative. All 5 sentinel lymph nodes were clear.  The patient's subsequent history is as detailed below.  INTERVAL HISTORY: Tracy Hart returns  today for follow-up of her breast cancer. She is tolerating tamoxifen well, with her hot flashes actually decreased by that medication. She has no vaginal discharge. She obtains a drug currently at no cost  REVIEW OF SYSTEMS: Tracy Hart is not exercising as regularly as he would like but she is planning to start a walking program. She does belong to a gym although she doesn't go regularly. A detailed review of systems today was otherwise entirely stable  PAST MEDICAL HISTORY: Past Medical History:  Diagnosis Date  . Anxiety    takes Xanax daily as needed  . Breast cancer ([place]) 08/19/15   right breast  . Cancer ([place]) 2016   right breast  . Depression    takes Lexapro daily  . Family history of breast cancer   . Family history of uterine cancer   . GERD (gastroesophageal reflux disease)   . History of bronchitis 2000  . History of hiatal hernia   . Hyperlipidemia    takes Atorvastatin daily  . Hypertension    takes Benicar daily  . Hypothyroidism    takes Synthroid daily  . Insomnia    takes Trazodone nightly  . Seasonal allergies    takes Zyrtec and uses Flonase daily    PAST SURGICAL HISTORY: Past Surgical History:  Procedure Laterality Date  . BREAST LUMPECTOMY WITH RADIOACTIVE SEED AND SENTINEL LYMPH NODE BIOPSY Right 09/01/2015   Procedure: RIGHT BREAST LUMPECTOMY WITH RADIOACTIVE SEED LOCALIZED AND SENTINEL LYMPH NODE BIOPSY;  Surgeon: [name], MD;  Location: [facility];  Service: General;  Laterality: Right;  . COLONOSCOPY    . ESOPHAGOGASTRODUODENOSCOPY    . HERNIA REPAIR Right 2009  . PORT-A-CATH REMOVAL Left 03/29/2016   Procedure: REMOVAL PORT-A-CATH;  Surgeon: [name], MD;  Location: [facility];  Service: General;  Laterality: Left;  . PORTACATH PLACEMENT Left 09/29/2015   Procedure: INSERTION PORT-A-CATH;  Surgeon: [name], MD;  Location: [facility];  Service: General;  Laterality: Left;  . TONSILLECTOMY  at age 55     FAMILY HISTORY Family History  Problem Relation Age of Onset  . Cancer Mother 30    breast  . Diabetes Mother   . Hypertension Mother   . Hypertension Father   . Cancer Sister 34    thyroid  . Seizures Sister   . Uterine cancer Maternal Grandmother 5  . Vaginal cancer Maternal Grandmother     dx in her 33s  . Heart disease Paternal Grandfather   . Breast cancer Sister 55    DCIS; negative genetic testing  . Colon cancer Paternal Aunt 82  . Breast cancer Cousin 55    paternal first cousin   The patient's parents are in their early 63s as of January 2017. The patient had no brothers, 3 sisters. The patient's mother was diagnosed with breast cancer at age 23. The patient's sister was diagnosed with thyroid cancer at age 76. There is no history of ovarian cancer in the family.   GYNECOLOGIC HISTORY:  No LMP recorded. Patient is postmenopausal. Menarche age 1, the patient is GX P0. She was on oral contraceptives between 1996 and 2016, and at the time of her diagnosis had been on hormone replacement for 2 months.   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 Her husband Tracy Hart") works in Occupational hygienist for a Technical sales engineer. At home is just the 2 of them, with no pets. The patient at tends [place]    ADVANCED DIRECTIVES: In place. The patient's husband is her healthcare power of attorney with her sister Tracy Hart listed as secondary   HEALTH MAINTENANCE: Social History  Substance Use Topics  . Smoking status: Former Smoker    Packs/day: 0.50    Types: Cigarettes    Quit date: 01/29/1989  . Smokeless tobacco: Never Used     Comment: quit smoking around 1990  . Alcohol use No     Colonoscopy: 2010/[facility]  PAP: November 2016  Bone density: Due/ at Dr [name]  Lipid panel:  Allergies  Allergen Reactions  . Codeine     No percocet, makes patient weird  . Sulfa Antibiotics Other (See Comments)     Caused mouth sores    Current Outpatient Prescriptions  Medication Sig Dispense Refill  . ALPRAZolam (XANAX) 0.5 MG tablet Take 0.25 mg by mouth at bedtime as needed for anxiety. Reported on 02/04/2016    . aspirin 81 MG tablet Take 81 mg by mouth daily. Reported on 02/20/2016    . atorvastatin (LIPITOR) 40 MG tablet Take 40 mg by mouth daily.    . cetirizine (ZYRTEC) 10 MG tablet Take 10 mg by mouth daily.    . cholecalciferol (VITAMIN D) 400 UNITS TABS tablet Take 800 Units by mouth 2 (two) times daily.    Marland Kitchen escitalopram (LEXAPRO) 10 MG tablet Take 10 mg by mouth daily.    . fluticasone (FLONASE) 50 MCG/ACT nasal spray Place 1 spray into both nostrils daily. Reported on 11/25/2015    . gabapentin (NEURONTIN) 300 MG capsule Take 1 capsule (300 mg total) by mouth 3 (three) times daily. 30 capsule 3  . levothyroxine (SYNTHROID, LEVOTHROID) 137 MCG tablet Take 137 mcg by mouth daily before breakfast.    .  Multiple Vitamin (MULTIVITAMIN WITH MINERALS) TABS tablet Take 1 tablet by mouth daily.    Marland Kitchen olmesartan-hydrochlorothiazide (BENICAR HCT) 40-25 MG per tablet Take 1 tablet by mouth daily. Reported on 02/13/2016    . Omega-3 Fatty Acids (FISH OIL) 1200 MG CAPS Take by mouth 2 (two) times daily. Reported on 02/13/2016    . tamoxifen (NOLVADEX) 20 MG tablet Take 1 tablet (20 mg total) by mouth daily. 30 tablet 3  . traZODone (DESYREL) 50 MG tablet Take 50 mg by mouth at bedtime. Reported on 12/02/2015     No current facility-administered medications for this visit.     OBJECTIVE: Middle-aged white woman In no acute distress  Vitals:   06/21/16 1100  BP: (!) 147/75  Pulse: 88  Resp: 17  Temp: 98.1 F (36.7 C)     Body mass index is 32.66 kg/m.    ECOG FS:0 - Asymptomatic  Hair has fully recovered, thick, salt-and-pepper, currently Sclerae unicteric, pupils round and equal Oropharynx clear and moist-- no thrush or other lesions No cervical or supraclavicular adenopathy Lungs no rales or  rhonchi Heart regular rate and rhythm Abd soft, nontender, positive bowel sounds MSK no focal spinal tenderness, no upper extremity lymphedema Neuro: nonfocal, well oriented, appropriate affect Breasts: The right breast is status post lumpectomy and radiation. There is no evidence of local recurrence. The right axilla is benign. Left breast is unremarkable   LAB RESULTS:  CMP     Component Value Date/Time   NA 140 06/21/2016 1103   K 4.5 06/21/2016 1103   CL 105 09/26/2015 0836   CO2 27 06/21/2016 1103   GLUCOSE 105 06/21/2016 1103   BUN 15.5 06/21/2016 1103   CREATININE 0.8 06/21/2016 1103   CALCIUM 9.5 06/21/2016 1103   PROT 7.5 06/21/2016 1103   ALBUMIN 3.8 06/21/2016 1103   AST 29 06/21/2016 1103   ALT 29 06/21/2016 1103   ALKPHOS 102 06/21/2016 1103   BILITOT 0.33 06/21/2016 1103   GFRNONAA >60 09/26/2015 0836   GFRAA >60 09/26/2015 0836    INo results found for: SPEP, UPEP  Lab Results  Component Value Date   WBC 5.7 06/21/2016   NEUTROABS 3.5 06/21/2016   HGB 13.3 06/21/2016   HCT 38.7 06/21/2016   MCV 101.3 (H) 06/21/2016   PLT 213 06/21/2016      Chemistry      Component Value Date/Time   NA 140 06/21/2016 1103   K 4.5 06/21/2016 1103   CL 105 09/26/2015 0836   CO2 27 06/21/2016 1103   BUN 15.5 06/21/2016 1103   CREATININE 0.8 06/21/2016 1103      Component Value Date/Time   CALCIUM 9.5 06/21/2016 1103   ALKPHOS 102 06/21/2016 1103   AST 29 06/21/2016 1103   ALT 29 06/21/2016 1103   BILITOT 0.33 06/21/2016 1103       No results found for: LABCA2  No components found for: LABCA125  No results for input(s): INR in the last 168 hours.  Urinalysis    Component Value Date/Time   LABSPEC 1.005 10/14/2015 0933   PHURINE 6.0 10/14/2015 0933   GLUCOSEU Negative 10/14/2015 0933   HGBUR Negative 10/14/2015 0933   BILIRUBINUR Negative 10/14/2015 0933   KETONESUR Negative 10/14/2015 0933   PROTEINUR Negative 10/14/2015 0933   UROBILINOGEN 0.2  10/14/2015 0933   NITRITE Negative 10/14/2015 0933   LEUKOCYTESUR Negative 10/14/2015 0933    STUDIES: No results found.   ASSESSMENT: 55 y.o. status post right breast upper outer  quadrant biopsy 08/19/2015 for a clinical T1b N0, stage IA invasive ductal carcinoma, estrogen and progesterone receptor positive, HER-2 not amplified, with an MIB-1 of 5%  (1) status post right lumpectomy and sentinel lymph node sampling 09/01/2015 for a pT1c pN0, stage IA invasive ductal carcinoma, grade 2, with negative margins; repeat HER-2 again negative  (2) Oncotype DX score of 23 falls in the intermediate range, and predicts a risk of recurrence outside the breast within 10 years of 15%, if the patient's only systemic therapy is tamoxifen for 5 years. It also predicts an absolute risk reduction of 4% with added chemotherapy (11% risk)  (3) cyclophosphamide and docetaxel x 4, given every 21 days with neulasta onpro started 10/06/15, completed 12/16/2015  (4) adjuvant radiation  01/26/2016 to 03/12/2016 1. The Right breast was treated to 50.4 Gy in 28 fractions at 1.8 Gy per fraction.  2. The Right breast was boosted to 10 Gy in 5 fractions at 2 Gy per fraction.  (5) tamoxifen started 03/23/2016  (a) bone density pending   (6) genetics testing 04/16/2016 through the Custom gene panel offered by [facility]  Found no deleterious mutations in ATM, BARD1, BRCA1, BRCA2, BRIP1, CDH1, CHEK2, EPCAM, FANCC, MLH1, MSH2, MSH6, MUTYH, NBN, PALB2, PMS2, POLD1, PTEN, RAD51C, RAD51D, TP53, and XRCC2.     PLAN: Tracy Hart is tolerating the tamoxifen remarkably well. The plan will be most likely to continue that to a total of 5 years, although she will have the option of switching to an aromatase inhibitor at some point in the future.  She understands that while on tamoxifen it is safe for her to use vaginal estrogen preparations if vaginal dryness or atrophy becomes an issue.  She is going to have her repeat mammography late  November and she will see Dr. Nori Riis shortly after that. She will see me again in April. She will follow-up with Dr. Dagmar Hait next fall  She knows to call for any problems that may develop before her next visit here. At this point I am delighted at how well she is doing both physically and emotionally.  Marland Kitchen   [name], MD   06/21/2016 6:33 PM [facility]

## 2016-07-02 NOTE — Progress Notes (Signed)
CLINIC:  Survivorship   REASON FOR VISIT:  Routine follow-up post-treatment for a recent history of breast cancer.  BRIEF ONCOLOGIC HISTORY:    Breast cancer of upper-outer quadrant of right female breast (Tracy Hart)   08/19/2015 Initial Biopsy    Right breast needle biopsy ([facility]): IDC, DCIS. ER+ (90%), PR+ (90%), Ki67 5%, HER2 neg (ratio 1.44).       09/01/2015 Surgery    (R) lumpectomy with SLNB Ninfa Linden): IDC, grade 2, spanning 1.7 cm, assoc DCIS, low-grade.  0/5 SLN. HER2 repeated and remains neg (ratio 1.04).   pT1c,pN0: Stage IA       09/01/2015 Oncotype testing    Recurrence score: 23; 15% ROR (intermediate-risk)      10/14/2015 - 12/16/2015 Adjuvant Chemotherapy    Taxotere/Cytoxan x 4 cycles      01/26/2016 - 03/12/2016 Radiation Therapy    Breast irradiation ([place]). The Right breast was treated to 50.4 Gy in 28 fractions at 1.8 Gy per fraction. The Right breast was boosted to 10 Gy in 5 fractions at 2 Gy per fraction.      03/31/2016 Procedure    Genetic testing: Negative. Genes analyzed: ATM, BARD1, BRCA1, BRCA2, BRIP1, CDH1, CHEK2, EPCAM, FANCC, MLH1, MSH2, MSH6, MUTYH, NBN, PALB2, PMS2, POLD1, PTEN, RAD51C, RAD51D, TP53, and XRCC2.       03/2016 -  Anti-estrogen oral therapy    Tamoxifen 20 mg daily       INTERVAL HISTORY:  Tracy Hart presents to the [facility] today for our initial meeting to review her survivorship care plan detailing her treatment course for breast cancer, as well as monitoring long-term side effects of that treatment, education regarding health maintenance, screening, and overall wellness and health promotion.     Overall, Tracy Hart reports feeling quite well since completing her radiation therapy approximately 3 months ago.  Tracy Hart Started the tamoxifen in 03/2016 with good tolerance. Tracy Hart reports occasional mild hot flashes, which are not concerning for her. Tracy Hart is taking gabapentin at night is helping with her night sweats. Tracy Hart endorses some left  arm/shoulder pain.  Tracy Hart denies vaginal dryness or discharge.  Tracy Hart is not exercising consistently, but does tell me Tracy Hart is planning on restarting her walking regimen.   REVIEW OF SYSTEMS:  Review of Systems  Constitutional: Negative.   HENT:  Negative.   Eyes: Negative.   Respiratory: Negative.   Cardiovascular: Negative.   Gastrointestinal: Negative.   Endocrine: Positive for hot flashes.  Musculoskeletal: Positive for arthralgias.  Neurological: Negative.   Hematological: Negative.   Psychiatric/Behavioral: Negative.   Breast: Denies any new nodularity, masses, tenderness, nipple changes, or nipple discharge.    A 14-point review of systems was completed and was negative, except as noted above.   ONCOLOGY TREATMENT TEAM:  1. Surgeon:  Dr. Ninfa Linden at [facility] 2. Medical Oncologist: Dr. Jana Hakim  3. Radiation Oncologist: Dr. Lisbeth Renshaw    PAST MEDICAL/SURGICAL HISTORY:  Past Medical History:  Diagnosis Date  . Anxiety    takes Xanax daily as needed  . Breast cancer ([place]) 08/19/15   right breast  . Cancer ([place]) 2016   right breast  . Depression    takes Lexapro daily  . Family history of breast cancer   . Family history of uterine cancer   . GERD (gastroesophageal reflux disease)   . History of bronchitis 2000  . History of hiatal hernia   . Hyperlipidemia    takes Atorvastatin daily  . Hypertension    takes  Benicar daily  . Hypothyroidism    takes Synthroid daily  . Insomnia    takes Trazodone nightly  . Seasonal allergies    takes Zyrtec and uses Flonase daily   Past Surgical History:  Procedure Laterality Date  . BREAST LUMPECTOMY WITH RADIOACTIVE SEED AND SENTINEL LYMPH NODE BIOPSY Right 09/01/2015   Procedure: RIGHT BREAST LUMPECTOMY WITH RADIOACTIVE SEED LOCALIZED AND SENTINEL LYMPH NODE BIOPSY;  Surgeon: [name], MD;  Location: [facility];  Service: General;  Laterality: Right;  . COLONOSCOPY    .  ESOPHAGOGASTRODUODENOSCOPY    . HERNIA REPAIR Right 2009  . PORT-A-CATH REMOVAL Left 03/29/2016   Procedure: REMOVAL PORT-A-CATH;  Surgeon: [name], MD;  Location: [facility];  Service: General;  Laterality: Left;  . PORTACATH PLACEMENT Left 09/29/2015   Procedure: INSERTION PORT-A-CATH;  Surgeon: [name], MD;  Location: [facility];  Service: General;  Laterality: Left;  . TONSILLECTOMY  at age 40     ALLERGIES:  Allergies  Allergen Reactions  . Codeine     No percocet, makes patient weird  . Sulfa Antibiotics Other (See Comments)    Caused mouth sores     CURRENT MEDICATIONS:  Outpatient Encounter Prescriptions as of 06/21/2016  Medication Sig Note  . aspirin 81 MG tablet Take 81 mg by mouth daily. Reported on 02/20/2016   . atorvastatin (LIPITOR) 40 MG tablet Take 40 mg by mouth daily.   . cetirizine (ZYRTEC) 10 MG tablet Take 10 mg by mouth daily.   . cholecalciferol (VITAMIN D) 400 UNITS TABS tablet Take 800 Units by mouth 2 (two) times daily.   Marland Kitchen escitalopram (LEXAPRO) 10 MG tablet Take 10 mg by mouth daily.   . fluticasone (FLONASE) 50 MCG/ACT nasal spray Place 1 spray into both nostrils daily. Reported on 11/25/2015   . gabapentin (NEURONTIN) 300 MG capsule Take 1 capsule (300 mg total) by mouth 3 (three) times daily.   Marland Kitchen levothyroxine (SYNTHROID, LEVOTHROID) 137 MCG tablet Take 137 mcg by mouth daily before breakfast.   . Multiple Vitamin (MULTIVITAMIN WITH MINERALS) TABS tablet Take 1 tablet by mouth daily.   Marland Kitchen olmesartan-hydrochlorothiazide (BENICAR HCT) 40-25 MG per tablet Take 1 tablet by mouth daily. Reported on 02/13/2016   . Omega-3 Fatty Acids (FISH OIL) 1200 MG CAPS Take by mouth 2 (two) times daily. Reported on 02/13/2016 09/25/2015: Takes when Tracy Hart thinks about it  . tamoxifen (NOLVADEX) 20 MG tablet Take 1 tablet (20 mg total) by mouth daily.   . traZODone (DESYREL) 50 MG tablet Take 50 mg by mouth at bedtime. Reported on 12/02/2015   . ALPRAZolam  (XANAX) 0.5 MG tablet Take 0.25 mg by mouth at bedtime as needed for anxiety. Reported on 02/04/2016 09/25/2015: .   No facility-administered encounter medications on file as of 06/21/2016.      ONCOLOGIC FAMILY HISTORY:  Family History  Problem Relation Age of Onset  . Cancer Mother 55    breast  . Diabetes Mother   . Hypertension Mother   . Hypertension Father   . Cancer Sister 45    thyroid  . Seizures Sister   . Uterine cancer Maternal Grandmother 73  . Vaginal cancer Maternal Grandmother     dx in her 21s  . Heart disease Paternal Grandfather   . Breast cancer Sister 19    DCIS; negative genetic testing  . Colon cancer Paternal Aunt 82  . Breast cancer Cousin 39    paternal first cousin  GENETIC COUNSELING/TESTING: 03/31/16-Genetic testing: Negative. Genes analyzed: ATM, BARD1, BRCA1, BRCA2, BRIP1, CDH1, CHEK2, EPCAM, FANCC, MLH1, MSH2, MSH6, MUTYH, NBN, PALB2, PMS2, POLD1, PTEN, RAD51C, RAD51D, TP53, and XRCC2.    SOCIAL HISTORY:  Tracy Hart is married and lives with her husband in [place], [place]. They do not have any children. 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  Tracy Hart denies any current tobacco, alcohol, or illicit drug use.     PHYSICAL EXAMINATION:  General: Well-nourished, well-appearing female in no acute distress.  Tracy Hart is unaccompanied today.   HEENT: Head is normocephalic. Conjunctivae clear without exudate.  Sclerae anicteric.   Cardiovascular: Regular rate and rhythm.Marland Kitchen Respiratory: Clear to auscultation bilaterally. Chest expansion symmetric; breathing non-labored.  GI: Abdomen soft and round; non-tender, non-distended. Bowel sounds normoactive.  GU: Deferred.  Neuro: No focal deficits. Steady gait.  Psych: Mood and affect normal and appropriate for situation.  Extremities: No edema. Skin: Warm and dry.  LABORATORY DATA:  None for this visit.  DIAGNOSTIC IMAGING:  None for this visit.      ASSESSMENT AND  PLAN:  Ms.. Hart is a pleasant 55 y.o. female with Stage IA right breast invasive ductal carcinoma, ER+/PR+/HER2-, diagnosed in 07/2015;  treated with lumpectomy, adjuvant chemotherapy with Taxotere/Cytoxan x 4 cycles, last chemo 12/16/15. Tracy Hart went on to have adjuvant radiation therapy, which Tracy Hart completed on 03/12/16. Tracy Hart began anti-estrogen therapy with Tamoxifen in 03/2016, with plans to continue for 5-10 years.  Tracy Hart presents to the Survivorship Clinic for our initial meeting and routine follow-up post-completion of treatment for breast cancer.    1. Stage IA right breast cancer:  Tracy Hart is continuing to recover from definitive treatment for breast cancer. Tracy Hart will follow-up with her medical oncologist, Dr. Jana Hakim today, and then at his discretion for continued surveillance.   Tracy Hart will continue her anti-estrogen therapy with tamoxifen. Thus far, Tracy Hart is tolerating the medication well, with minimal side effects. Though the incidence is low, there is an associated risk of endometrial cancer with anti-estrogen therapies like Tamoxifen.  Tracy Hart was encouraged to contact Dr. Jana Hakim or myself with any vaginal bleeding while taking Tamoxifen. Common side effects of Tamoxifen were again reviewed with her as well. Today, a comprehensive survivorship care plan and treatment summary was reviewed with the patient today detailing her breast cancer diagnosis, treatment course, potential late/long-term effects of treatment, appropriate follow-up care with recommendations for the future, and patient education resources.  A copy of this summary, along with a letter will be sent to the patient's primary care provider via mail/fax/In Basket message after today's visit.    2. Bone health:  Given Tracy Hart's age & history of breast cancer, Tracy Hart is at risk for bone demineralization.  We do not have any records of recent DEXA scan. We did review that tamoxifen often offers the positive side effect of increased bone density,  when compared to aromatase inhibitors. Therefore I will defer any future DEXA scan imaging to her medical oncologist or PCP, as clinically indicated. In the meantime, Tracy Hart was encouraged to increase her consumption of foods rich in calcium, as well as increase her weight-bearing activities.  Tracy Hart was given education on specific activities to promote bone health.  3. Cancer screening:  Due to Tracy Hart's history and her age, Tracy Hart should receive screening for skin cancers, colon cancer, and gynecologic cancers.  The information and recommendations are listed on the patient's comprehensive care plan/treatment summary and were reviewed in  detail with the patient.    4. Health maintenance and wellness promotion: Tracy Hart was encouraged to consume 5-7 servings of fruits and vegetables per day. We reviewed the "Nutrition Rainbow" handout, as well as the handout "Take Control of Your Health and Reduce Your Cancer Risk" from the [employer].  Tracy Hart was also encouraged to engage in moderate to vigorous exercise for 30 minutes per day most days of the week. We discussed the [employer] fitness program, which is designed for cancer survivors to help them become more physically fit after cancer treatments.  Tracy Hart was instructed to limit her alcohol consumption and continue to abstain from tobacco use.   5. Support services/counseling: It is not uncommon for this period of the patient's cancer care trajectory to be one of many emotions and stressors.  We discussed an opportunity for her to participate in the next session of [facility] ("Finding Your New Normal") support group series designed for patients after they have completed treatment.   Tracy Hart was encouraged to take advantage of our many other support services programs, support groups, and/or counseling in coping with her new life as a cancer survivor after completing anti-cancer treatment.  Tracy Hart was offered support today through active listening and expressive  supportive counseling.  Tracy Hart was given information regarding our available services and encouraged to contact me with any questions or for help enrolling in any of our support group/programs.    Dispo:   -Tracy Hart will see Dr. Jana Hakim today after her survivorship visit.  -Return to cancer center to see Dr. Jana Hakim at his discretion.   -Tracy Hart is welcome to return back to the Survivorship Clinic at any time; no additional follow-up needed at this time.  -Consider referral back to survivorship as a long-term survivor for continued surveillance   A total of 30 minutes of face-to-face time was spent with this patient with greater than 50% of that time in counseling and care-coordination.   (Coding/Billing notation: This patient is a no charge. Tracy Hart is seeing Dr. Jana Hakim today as well and he will bill for his visit.)   Mike Craze, NP [facility] [place] [phone]   Note: [name], [place] [phone]

## 2016-07-19 ENCOUNTER — Other Ambulatory Visit: Payer: Self-pay

## 2016-07-19 MED ORDER — TAMOXIFEN CITRATE 20 MG PO TABS: 20.0000 mg | ORAL_TABLET | Freq: Every day | ORAL | 5 refills | Status: DC

## 2016-07-21 ENCOUNTER — Ambulatory Visit
Admission: RE | Admit: 2016-07-21 | Discharge: 2016-07-21 | Disposition: A | Discharge status: Home / Self Care | Payer: BLUE CROSS/BLUE SHIELD | Source: Ambulatory Visit | Attending: Oncology | Admitting: Oncology

## 2016-07-21 DIAGNOSIS — R922 Inconclusive mammogram: Secondary | ICD-10-CM | POA: Diagnosis not present

## 2016-07-21 DIAGNOSIS — C50411 Malignant neoplasm of upper-outer quadrant of right female breast: Secondary | ICD-10-CM

## 2016-07-21 DIAGNOSIS — Z170 Estrogen receptor positive status [ER+]: Principal | ICD-10-CM

## 2016-07-26 DIAGNOSIS — Z01419 Encounter for gynecological examination (general) (routine) without abnormal findings: Secondary | ICD-10-CM | POA: Diagnosis not present

## 2016-07-26 DIAGNOSIS — Z6833 Body mass index (BMI) 33.0-33.9, adult: Secondary | ICD-10-CM | POA: Diagnosis not present

## 2016-07-27 ENCOUNTER — Other Ambulatory Visit: Payer: Self-pay | Admitting: *Deleted

## 2016-07-27 MED ORDER — GABAPENTIN 300 MG PO CAPS: 300.0000 mg | ORAL_CAPSULE | Freq: Three times a day (TID) | ORAL | 4 refills | Status: DC

## 2016-07-27 NOTE — Telephone Encounter (Signed)
Facsimile refill request for gabapentin received.

## 2016-11-23 DIAGNOSIS — C50911 Malignant neoplasm of unspecified site of right female breast: Secondary | ICD-10-CM | POA: Diagnosis not present

## 2016-11-23 DIAGNOSIS — Z23 Encounter for immunization: Secondary | ICD-10-CM | POA: Diagnosis not present

## 2016-11-23 DIAGNOSIS — R7301 Impaired fasting glucose: Secondary | ICD-10-CM | POA: Diagnosis not present

## 2016-11-23 DIAGNOSIS — F418 Other specified anxiety disorders: Secondary | ICD-10-CM | POA: Diagnosis not present

## 2016-11-23 DIAGNOSIS — I10 Essential (primary) hypertension: Secondary | ICD-10-CM | POA: Diagnosis not present

## 2016-12-03 ENCOUNTER — Other Ambulatory Visit: Payer: Self-pay

## 2016-12-03 DIAGNOSIS — C50411 Malignant neoplasm of upper-outer quadrant of right female breast: Secondary | ICD-10-CM

## 2016-12-03 DIAGNOSIS — Z170 Estrogen receptor positive status [ER+]: Principal | ICD-10-CM

## 2016-12-06 ENCOUNTER — Ambulatory Visit (HOSPITAL_BASED_OUTPATIENT_CLINIC_OR_DEPARTMENT_OTHER): Payer: BLUE CROSS/BLUE SHIELD | Admitting: Oncology

## 2016-12-06 ENCOUNTER — Encounter: Payer: Self-pay | Admitting: Pharmacist

## 2016-12-06 ENCOUNTER — Other Ambulatory Visit (HOSPITAL_BASED_OUTPATIENT_CLINIC_OR_DEPARTMENT_OTHER): Payer: BLUE CROSS/BLUE SHIELD

## 2016-12-06 ENCOUNTER — Other Ambulatory Visit: Payer: Self-pay | Admitting: *Deleted

## 2016-12-06 VITALS — BP 135/74 | HR 77 | Temp 98.0°F | Wt 209.0 lb

## 2016-12-06 DIAGNOSIS — C50411 Malignant neoplasm of upper-outer quadrant of right female breast: Secondary | ICD-10-CM

## 2016-12-06 DIAGNOSIS — Z170 Estrogen receptor positive status [ER+]: Secondary | ICD-10-CM

## 2016-12-06 LAB — COMPREHENSIVE METABOLIC PANEL
ALBUMIN: 4.3 g/dL (ref 3.5–5.0)
ALK PHOS: 71 U/L (ref 40–150)
ALT: 20 U/L (ref 0–55)
AST: 20 U/L (ref 5–34)
Anion Gap: 9 mEq/L (ref 3–11)
BILIRUBIN TOTAL: 0.32 mg/dL (ref 0.20–1.20)
BUN: 12.7 mg/dL (ref 7.0–26.0)
CALCIUM: 9.8 mg/dL (ref 8.4–10.4)
CO2: 27 mEq/L (ref 22–29)
CREATININE: 0.8 mg/dL (ref 0.6–1.1)
Chloride: 103 mEq/L (ref 98–109)
EGFR: 85 mL/min/{1.73_m2} — ABNORMAL LOW (ref >90)
GLUCOSE: 97 mg/dL (ref 70–140)
POTASSIUM: 4.6 meq/L (ref 3.5–5.1)
SODIUM: 139 meq/L (ref 136–145)
TOTAL PROTEIN: 7.5 g/dL (ref 6.4–8.3)

## 2016-12-06 LAB — CBC WITH DIFFERENTIAL/PLATELET
BASO%: 0.7 % (ref 0.0–2.0)
Basophils Absolute: 0 10*3/uL (ref 0.0–0.1)
EOS%: 2.6 % (ref 0.0–7.0)
Eosinophils Absolute: 0.2 10*3/uL (ref 0.0–0.5)
HEMATOCRIT: 37.4 % (ref 34.8–46.6)
HEMOGLOBIN: 13.4 g/dL (ref 11.6–15.9)
LYMPH#: 2 10*3/uL (ref 0.9–3.3)
LYMPH%: 31.1 % (ref 14.0–49.7)
MCH: 35.6 pg — ABNORMAL HIGH (ref 25.1–34.0)
MCHC: 35.8 g/dL (ref 31.5–36.0)
MCV: 99.4 fL (ref 79.5–101.0)
MONO#: 0.6 10*3/uL (ref 0.1–0.9)
MONO%: 9.5 % (ref 0.0–14.0)
NEUT%: 56.1 % (ref 38.4–76.8)
NEUTROS ABS: 3.6 10*3/uL (ref 1.5–6.5)
Platelets: 207 10*3/uL (ref 145–400)
RBC: 3.77 10*6/uL (ref 3.70–5.45)
RDW: 12.2 % (ref 11.2–14.5)
WBC: 6.4 10*3/uL (ref 3.9–10.3)

## 2016-12-06 MED ORDER — TAMOXIFEN CITRATE 20 MG PO TABS: 20.0000 mg | ORAL_TABLET | Freq: Every day | ORAL | 5 refills | Status: DC

## 2016-12-06 MED ORDER — GABAPENTIN 300 MG PO CAPS: 300.0000 mg | ORAL_CAPSULE | Freq: Every day | ORAL | 4 refills | Status: DC

## 2016-12-06 MED ORDER — GABAPENTIN 300 MG PO CAPS: 300.0000 mg | ORAL_CAPSULE | Freq: Every day | ORAL | 3 refills | Status: DC

## 2016-12-06 NOTE — Progress Notes (Signed)
Consent documentation  Study code: [id number]  Met with patient following the patients provider visit on 12/06/16 at 10:30. Provided an overview of the "Pharmacogenetic analysis of toxicities related to administration of taxanes in breast cancer patients" study. Patient was alone for this visit. Consent form was reviewed with the patient (reviewed the study purpose, patient's role, possible side effects, cost, risk, information protection) All of the patient's questions were answered and she agreed to participate in the study. A signed copy of the consent form was given to the patient. All eligibility criteria have been met and patient has been enrolled in the study.  Study sample was obtain via buccal swab after consent was obtained. Patient was informed that the sample would be sent to the lab for processing after samples were collected from 25 patients and that it would take approximately 2 weeks for the lab to process that sample.   Met with the patient for 15 minutes.  Darl Pikes, PharmD, [place] Clinical Pharmacist- Oncology Pharmacy Resident

## 2016-12-06 NOTE — Progress Notes (Signed)
[facility]  [phone] [phone]) [phone]   ID: Tracy Hart DOB: October 21, 1960  MR#: [id number]  WNI#:[id number]  Patient Care Team: Prince Solian, MD as PCP - General (Internal Medicine) [name] [name], MD as Consulting Physician (Obstetrics and Gynecology) [name], MD as Consulting Physician (Oncology) [name], MD as Consulting Physician (General Surgery) [name], MD as Consulting Physician (Radiation Oncology) PCP: [name], MD OTHER MD:  CHIEF COMPLAINT: Estrogen receptor positive breast cancer  CURRENT TREATMENT: Tamoxifen  BREAST CANCER HISTORY: From the original intake note:  "Tracy Hart" had routine screening mammography at Dr. [name] office late November, showing a possible mass in the right breast. On 08/04/2015 she underwent right diagnostic mammography with tomosynthesis and right breast ultrasonography at the breast Center. The breast density was category C. In the upper outer quadrant of the right breast there was a 0.8 cm area of asymmetry which was not palpable by exam. Ultrasound showed no suspicious mass or calcifications.  Biopsy of the area of asymmetry was performed 08/19/2015, and showed ([id number]) invasive and in situ ductal carcinoma, E-cadherin positive, estrogen receptor 90% positive, progesterone receptor 90% positive, both with strong staining intensity, with an MIB-1 of 5%, and no HER-2 amplification, the signals ratio being 1.44 and the number per cell 1.80.  The patient was then referred to surgery and after appropriate discussion underwent right lumpectomy and sentinel lymph node sampling 09/01/2015. The pathology from this procedure ([id number]) confirmed an invasive ductal carcinoma, grade 2, measuring 1.7 cm, with some low-grade ductal carcinoma in situ. Margins were negative. All 5 sentinel lymph nodes were clear.  The patient's subsequent history is as detailed below.  INTERVAL HISTORY: Tracy Hart  returns today for follow-up of her estrogen receptor positive breast cancer. She continues on tamoxifen, with excellent tolerance. Hot flashes are mild to moderate. Vaginal dryness or wetness is not a big issue. She obtains a drug at a very good price.  REVIEW OF SYSTEMS: Tracy Hart is doing weight watchers and is losing about a pound a week. Her husband has been diagnosed with diabetes and so she has change the diet for both of them. Her on a once is 5.4. She is walking about 45 minutes most days and then sometimes in the evening as well. In the weekends she goes to the gym. A detailed review of systems today was otherwise noncontributory  PAST MEDICAL HISTORY: Past Medical History:  Diagnosis Date  . Anxiety    takes Xanax daily as needed  . Breast cancer ([facility]) 08/19/15   right breast  . Cancer ([place]) 2016   right breast  . Depression    takes Lexapro daily  . Family history of breast cancer   . Family history of uterine cancer   . GERD (gastroesophageal reflux disease)   . History of bronchitis 2000  . History of hiatal hernia   . Hyperlipidemia    takes Atorvastatin daily  . Hypertension    takes Benicar daily  . Hypothyroidism    takes Synthroid daily  . Insomnia    takes Trazodone nightly  . Seasonal allergies    takes Zyrtec and uses Flonase daily    PAST SURGICAL HISTORY: Past Surgical History:  Procedure Laterality Date  . BREAST LUMPECTOMY WITH RADIOACTIVE SEED AND SENTINEL LYMPH NODE BIOPSY Right 09/01/2015   Procedure: RIGHT BREAST LUMPECTOMY WITH RADIOACTIVE SEED LOCALIZED AND SENTINEL LYMPH NODE BIOPSY;  Surgeon: [name], MD;  Location: [facility];  Service: General;  Laterality:  Right;  Marland Kitchen COLONOSCOPY    . ESOPHAGOGASTRODUODENOSCOPY    . HERNIA REPAIR Right 2009  . PORT-A-CATH REMOVAL Left 03/29/2016   Procedure: REMOVAL PORT-A-CATH;  Surgeon: [name], MD;  Location: [facility];  Service: General;  Laterality: Left;  .  PORTACATH PLACEMENT Left 09/29/2015   Procedure: INSERTION PORT-A-CATH;  Surgeon: [name], MD;  Location: [facility];  Service: General;  Laterality: Left;  . TONSILLECTOMY  at age 1    FAMILY HISTORY Family History  Problem Relation Age of Onset  . Cancer Mother 63    breast  . Diabetes Mother   . Hypertension Mother   . Hypertension Father   . Cancer Sister 106    thyroid  . Seizures Sister   . Uterine cancer Maternal Grandmother 72  . Vaginal cancer Maternal Grandmother     dx in her 13s  . Heart disease Paternal Grandfather   . Breast cancer Sister 73    DCIS; negative genetic testing  . Colon cancer Paternal Aunt 82  . Breast cancer Cousin 34    paternal first cousin   The patient's parents are in their early 50s as of January 2017. The patient had no brothers, 3 sisters. The patient's mother was diagnosed with breast cancer at age 89. The patient's sister was diagnosed with thyroid cancer at age 56. There is no history of ovarian cancer in the family.   GYNECOLOGIC HISTORY:  No LMP recorded. Patient is postmenopausal. Menarche age 28, the patient is GX P0. She was on oral contraceptives between 1996 and 2016, and at the time of her diagnosis had been on hormone replacement for 2 months.   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 Her husband Tracy Hart") works in Occupational hygienist for a Technical sales engineer. At home is just the 2 of them, with no pets. The patient at tends [place]    ADVANCED DIRECTIVES: In place. The patient's husband is her healthcare power of attorney with her sister Tracy Hart listed as secondary   HEALTH MAINTENANCE: Social History  Substance Use Topics  . Smoking status: Former Smoker    Packs/day: 0.50    Types: Cigarettes    Quit date: 01/29/1989  . Smokeless tobacco: Never Used     Comment: quit smoking around 1990  . Alcohol use No     Colonoscopy: 2010/[facility]  PAP: November  2016  Bone density: Due/ at Dr [name]  Lipid panel:  Allergies  Allergen Reactions  . Codeine     No percocet, makes patient weird  . Sulfa Antibiotics Other (See Comments)    Caused mouth sores    Current Outpatient Prescriptions  Medication Sig Dispense Refill  . ALPRAZolam (XANAX) 0.5 MG tablet Take 0.25 mg by mouth at bedtime as needed for anxiety. Reported on 02/04/2016    . aspirin 81 MG tablet Take 81 mg by mouth daily. Reported on 02/20/2016    . atorvastatin (LIPITOR) 40 MG tablet Take 40 mg by mouth daily.    . cetirizine (ZYRTEC) 10 MG tablet Take 10 mg by mouth daily.    . cholecalciferol (VITAMIN D) 400 UNITS TABS tablet Take 800 Units by mouth 2 (two) times daily.    Marland Kitchen escitalopram (LEXAPRO) 10 MG tablet Take 10 mg by mouth daily.    . fluticasone (FLONASE) 50 MCG/ACT nasal spray Place 1 spray into both nostrils daily. Reported on 11/25/2015    . gabapentin (  NEURONTIN) 300 MG capsule Take 1 capsule (300 mg total) by mouth 3 (three) times daily. 90 capsule 4  . levothyroxine (SYNTHROID, LEVOTHROID) 137 MCG tablet Take 137 mcg by mouth daily before breakfast.    . Multiple Vitamin (MULTIVITAMIN WITH MINERALS) TABS tablet Take 1 tablet by mouth daily.    Marland Kitchen olmesartan-hydrochlorothiazide (BENICAR HCT) 40-25 MG per tablet Take 1 tablet by mouth daily. Reported on 02/13/2016    . Omega-3 Fatty Acids (FISH OIL) 1200 MG CAPS Take by mouth 2 (two) times daily. Reported on 02/13/2016    . tamoxifen (NOLVADEX) 20 MG tablet Take 1 tablet (20 mg total) by mouth daily. 30 tablet 5  . traZODone (DESYREL) 50 MG tablet Take 50 mg by mouth at bedtime. Reported on 12/02/2015     No current facility-administered medications for this visit.     OBJECTIVE: Middle-aged white woman we'll appears stated age  20:   12/06/16 1048  BP: 135/74  Pulse: 77  Temp: 98 F (36.7 C)     Body mass index is 31.78 kg/m.    ECOG FS:0 - Asymptomatic  Sclerae unicteric, pupils round and equal Oropharynx  clear and moist No cervical or supraclavicular adenopathy Lungs no rales or rhonchi Heart regular rate and rhythm Abd soft, nontender, positive bowel sounds MSK no focal spinal tenderness, no upper extremity lymphedema Neuro: nonfocal, well oriented, appropriate affect Breasts: The right breast has undergone lumpectomy followed by radiation with no evidence of local recurrence. The left breast is unremarkable. Both axillae are benign.    LAB RESULTS:  CMP     Component Value Date/Time   NA 140 06/21/2016 1103   K 4.5 06/21/2016 1103   CL 105 09/26/2015 0836   CO2 27 06/21/2016 1103   GLUCOSE 105 06/21/2016 1103   BUN 15.5 06/21/2016 1103   CREATININE 0.8 06/21/2016 1103   CALCIUM 9.5 06/21/2016 1103   PROT 7.5 06/21/2016 1103   ALBUMIN 3.8 06/21/2016 1103   AST 29 06/21/2016 1103   ALT 29 06/21/2016 1103   ALKPHOS 102 06/21/2016 1103   BILITOT 0.33 06/21/2016 1103   GFRNONAA >60 09/26/2015 0836   GFRAA >60 09/26/2015 0836    INo results found for: SPEP, UPEP  Lab Results  Component Value Date   WBC 6.4 12/06/2016   NEUTROABS 3.6 12/06/2016   HGB 13.4 12/06/2016   HCT 37.4 12/06/2016   MCV 99.4 12/06/2016   PLT 207 12/06/2016      Chemistry      Component Value Date/Time   NA 140 06/21/2016 1103   K 4.5 06/21/2016 1103   CL 105 09/26/2015 0836   CO2 27 06/21/2016 1103   BUN 15.5 06/21/2016 1103   CREATININE 0.8 06/21/2016 1103      Component Value Date/Time   CALCIUM 9.5 06/21/2016 1103   ALKPHOS 102 06/21/2016 1103   AST 29 06/21/2016 1103   ALT 29 06/21/2016 1103   BILITOT 0.33 06/21/2016 1103       No results found for: LABCA2  No components found for: LABCA125  No results for input(s): INR in the last 168 hours.  Urinalysis    Component Value Date/Time   LABSPEC 1.005 10/14/2015 0933   PHURINE 6.0 10/14/2015 0933   GLUCOSEU Negative 10/14/2015 0933   HGBUR Negative 10/14/2015 0933   BILIRUBINUR Negative 10/14/2015 0933   KETONESUR  Negative 10/14/2015 0933   PROTEINUR Negative 10/14/2015 0933   UROBILINOGEN 0.2 10/14/2015 0933   NITRITE Negative 10/14/2015 0933  LEUKOCYTESUR Negative 10/14/2015 0933    STUDIES:  next mammogram due November 2018.   RESEARCH: signed up for neuropathy study 12/06/2016  ASSESSMENT: 56 y.o. status post right breast upper outer quadrant biopsy 08/19/2015 for a clinical T1b N0, stage IA invasive ductal carcinoma, estrogen and progesterone receptor positive, HER-2 not amplified, with an MIB-1 of 5%  (1) status post right lumpectomy and sentinel lymph node sampling 09/01/2015 for a pT1c pN0, stage IA invasive ductal carcinoma, grade 2, with negative margins; repeat HER-2 again negative  (2) Oncotype DX score of 23 falls in the intermediate range, and predicts a risk of recurrence outside the breast within 10 years of 15%, if the patient's only systemic therapy is tamoxifen for 5 years. It also predicts an absolute risk reduction of 4% with added chemotherapy (to a final 11% risk)  (3) cyclophosphamide and docetaxel x 4, given every 21 days with neulasta onpro started 10/06/15, completed 12/16/2015  (4) adjuvant radiation  01/26/2016 to 03/12/2016 1. The Right breast was treated to 50.4 Gy in 28 fractions at 1.8 Gy per fraction.  2. The Right breast was boosted to 10 Gy in 5 fractions at 2 Gy per fraction.  (5) tamoxifen started 03/23/2016  (a) bone density pending   (6) genetics testing 04/16/2016 through the Custom gene panel offered by [facility]  found no deleterious mutations in ATM, BARD1, BRCA1, BRCA2, BRIP1, CDH1, CHEK2, EPCAM, FANCC, MLH1, MSH2, MSH6, MUTYH, NBN, PALB2, PMS2, POLD1, PTEN, RAD51C, RAD51D, TP53, and XRCC2.     PLAN: Tracy Hart is now a little over a year out from definitive surgery for her breast cancer with no evidence of disease recurrence. This is very favorable.  She is tolerating tamoxifen with no significant side effects. The plan will be to continue that the minimum of  5 years.  She will have her next mammogram in November. If she sees Dr. Ninfa Linden the first week in December then I will see her again next May and we will continue to "tag team her" in that fashion until she completes her 5 years of follow-up  Today we discussed the neuropathy study and she agreed to participate.  She knows to call for any problems that may develop before her next visit here. Marland Kitchen   [name], MD   12/06/2016 11:01 AM [facility]

## 2017-01-18 ENCOUNTER — Encounter (HOSPITAL_COMMUNITY): Payer: Self-pay

## 2017-05-06 ENCOUNTER — Encounter: Payer: Self-pay | Admitting: Pharmacist

## 2017-05-06 DIAGNOSIS — Z170 Estrogen receptor positive status [ER+]: Secondary | ICD-10-CM

## 2017-05-06 DIAGNOSIS — C50411 Malignant neoplasm of upper-outer quadrant of right female breast: Secondary | ICD-10-CM

## 2017-05-06 NOTE — Progress Notes (Signed)
Telephone documentation  Study code: [id number]  Left VM for patient informing her that her buccal swab for "Pharmacogenetic analysis of toxicities related to administration of taxanes in breast cancer patients" study did NOT contain enough DNA to yield results. Provide my office number to the patient if she had additional questions.  Darl Pikes, PharmD, BCPS Hematology/Oncology Clinical Pharmacist [facility] [phone]

## 2017-05-30 ENCOUNTER — Other Ambulatory Visit: Payer: Self-pay | Admitting: Oncology

## 2017-05-30 DIAGNOSIS — Z853 Personal history of malignant neoplasm of breast: Secondary | ICD-10-CM

## 2017-06-07 ENCOUNTER — Other Ambulatory Visit: Payer: Self-pay | Admitting: Oncology

## 2017-06-10 DIAGNOSIS — R8290 Unspecified abnormal findings in urine: Secondary | ICD-10-CM | POA: Diagnosis not present

## 2017-06-10 DIAGNOSIS — Z0000 Encounter for general adult medical examination without abnormal findings: Secondary | ICD-10-CM | POA: Diagnosis not present

## 2017-06-10 DIAGNOSIS — E038 Other specified hypothyroidism: Secondary | ICD-10-CM | POA: Diagnosis not present

## 2017-06-10 DIAGNOSIS — R7301 Impaired fasting glucose: Secondary | ICD-10-CM | POA: Diagnosis not present

## 2017-06-10 DIAGNOSIS — I10 Essential (primary) hypertension: Secondary | ICD-10-CM | POA: Diagnosis not present

## 2017-06-17 DIAGNOSIS — Z1389 Encounter for screening for other disorder: Secondary | ICD-10-CM | POA: Diagnosis not present

## 2017-06-17 DIAGNOSIS — R7301 Impaired fasting glucose: Secondary | ICD-10-CM | POA: Diagnosis not present

## 2017-06-17 DIAGNOSIS — I10 Essential (primary) hypertension: Secondary | ICD-10-CM | POA: Diagnosis not present

## 2017-06-17 DIAGNOSIS — E038 Other specified hypothyroidism: Secondary | ICD-10-CM | POA: Diagnosis not present

## 2017-06-17 DIAGNOSIS — Z6833 Body mass index (BMI) 33.0-33.9, adult: Secondary | ICD-10-CM | POA: Diagnosis not present

## 2017-06-17 DIAGNOSIS — Z0000 Encounter for general adult medical examination without abnormal findings: Secondary | ICD-10-CM | POA: Diagnosis not present

## 2017-06-17 DIAGNOSIS — E7849 Other hyperlipidemia: Secondary | ICD-10-CM | POA: Diagnosis not present

## 2017-06-20 DIAGNOSIS — Z1212 Encounter for screening for malignant neoplasm of rectum: Secondary | ICD-10-CM | POA: Diagnosis not present

## 2017-07-22 ENCOUNTER — Ambulatory Visit
Admission: RE | Admit: 2017-07-22 | Discharge: 2017-07-22 | Disposition: A | Discharge status: Home / Self Care | Payer: BLUE CROSS/BLUE SHIELD | Source: Ambulatory Visit | Attending: Oncology | Admitting: Oncology

## 2017-07-22 DIAGNOSIS — Z853 Personal history of malignant neoplasm of breast: Secondary | ICD-10-CM

## 2017-07-22 HISTORY — DX: Personal history of antineoplastic chemotherapy: Z92.21

## 2017-07-22 HISTORY — DX: Personal history of irradiation: Z92.3

## 2017-07-27 DIAGNOSIS — Z01419 Encounter for gynecological examination (general) (routine) without abnormal findings: Secondary | ICD-10-CM | POA: Diagnosis not present

## 2017-07-27 DIAGNOSIS — Z6833 Body mass index (BMI) 33.0-33.9, adult: Secondary | ICD-10-CM | POA: Diagnosis not present

## 2017-07-27 DIAGNOSIS — Z13820 Encounter for screening for osteoporosis: Secondary | ICD-10-CM | POA: Diagnosis not present

## 2017-10-10 DIAGNOSIS — R42 Dizziness and giddiness: Secondary | ICD-10-CM | POA: Diagnosis not present

## 2017-10-14 ENCOUNTER — Telehealth: Payer: Self-pay | Admitting: Oncology

## 2017-10-14 NOTE — Telephone Encounter (Signed)
Returned patients call to r/s appointment. Patient will be out of town.

## 2017-11-14 ENCOUNTER — Other Ambulatory Visit: Payer: Self-pay | Admitting: Oncology

## 2017-12-06 IMAGING — MG MM BREAST BX W/ LOC DEV 1ST LESION IMAGE BX SPEC STEREO GUIDE*R*
5 series · 6 of 13 positions shown · non-contrast
Comparison: Previous exams.

ADDENDUM:
Pathology reveals INVASIVE AND IN SITU MAMMARY CARCINOMA of the
upper outer quadrant of the Right breast. This was found to be
concordant by Dr. Keyur Dorsett. Pathology results were discussed
with the patient via telephone. She reported tenderness at the
biopsy site and is doing well otherwise. Post biopsy care and
instructions were reviewed and questions were answered. She was
encouraged to contact The [[email or web address]] with
any additional questions and or concerns. A surgical referral was
arranged with Dr. Sofiyya Ganancial of [REDACTED] on
August 21, 2015.

Pathology results reported by Ugwoke Bhadmus RN on August 20, 2015.
CLINICAL DATA: Patient with right breast asymmetry/distortion
presents today for stereotactic-guided biopsy with tomosynthesis
guidance.
EXAM:
RIGHT BREAST STEREOTACTIC CORE NEEDLE BIOPSY

[R LM (1 of 3)]
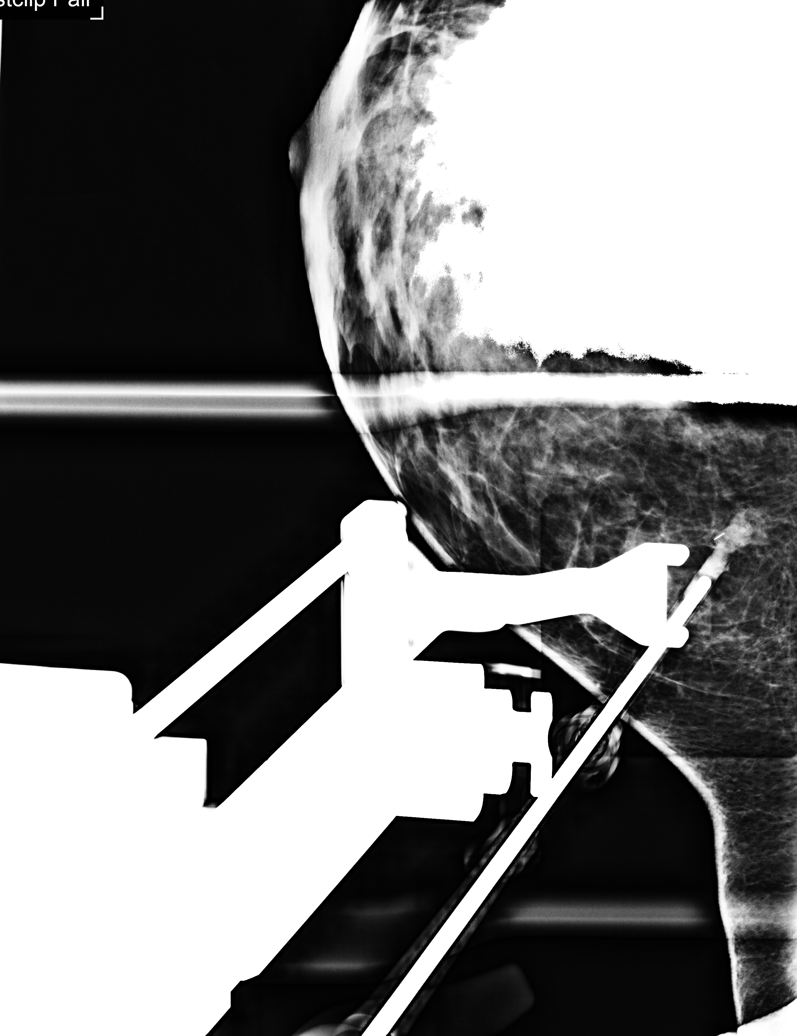

[R LM (2 of 3)]
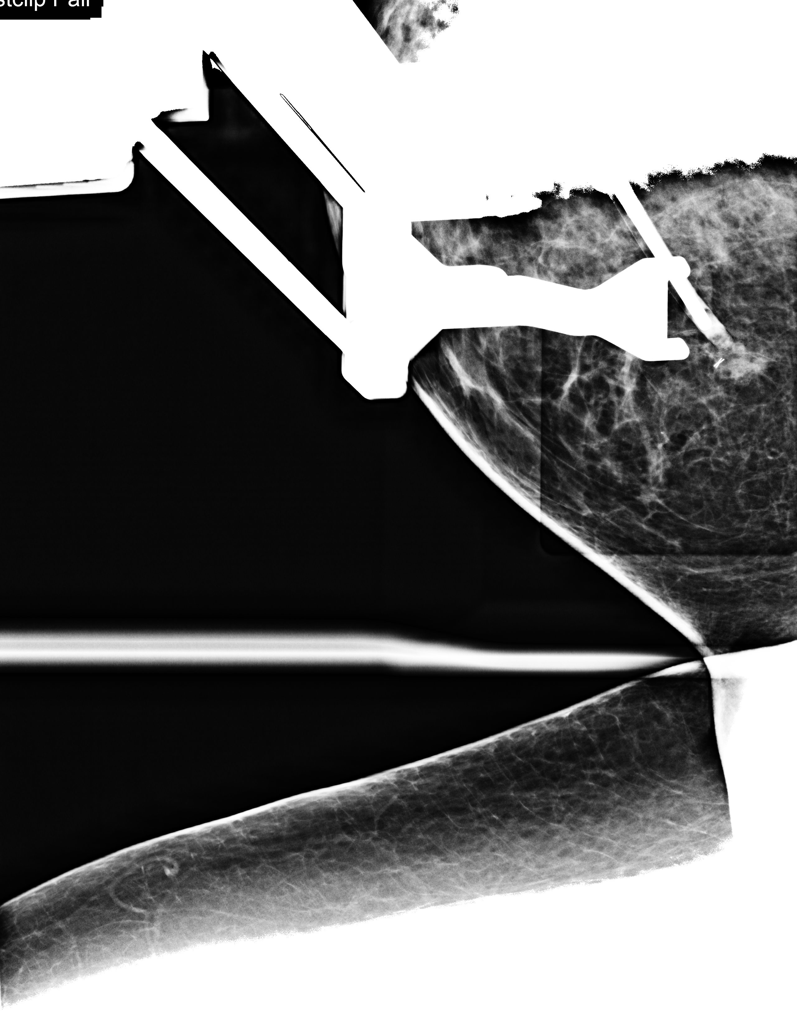

[R LM (3 of 3)]
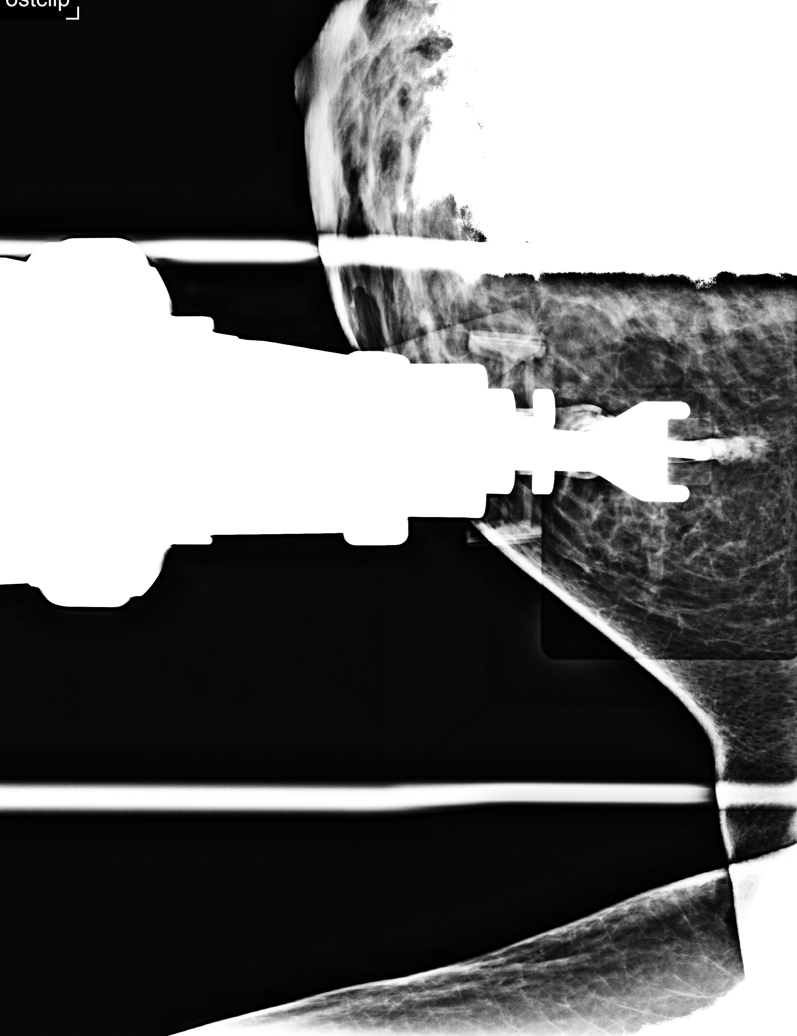

[R LM tomo · 2 of 57 frames shown (1 of 2)]
[frame 19/57]
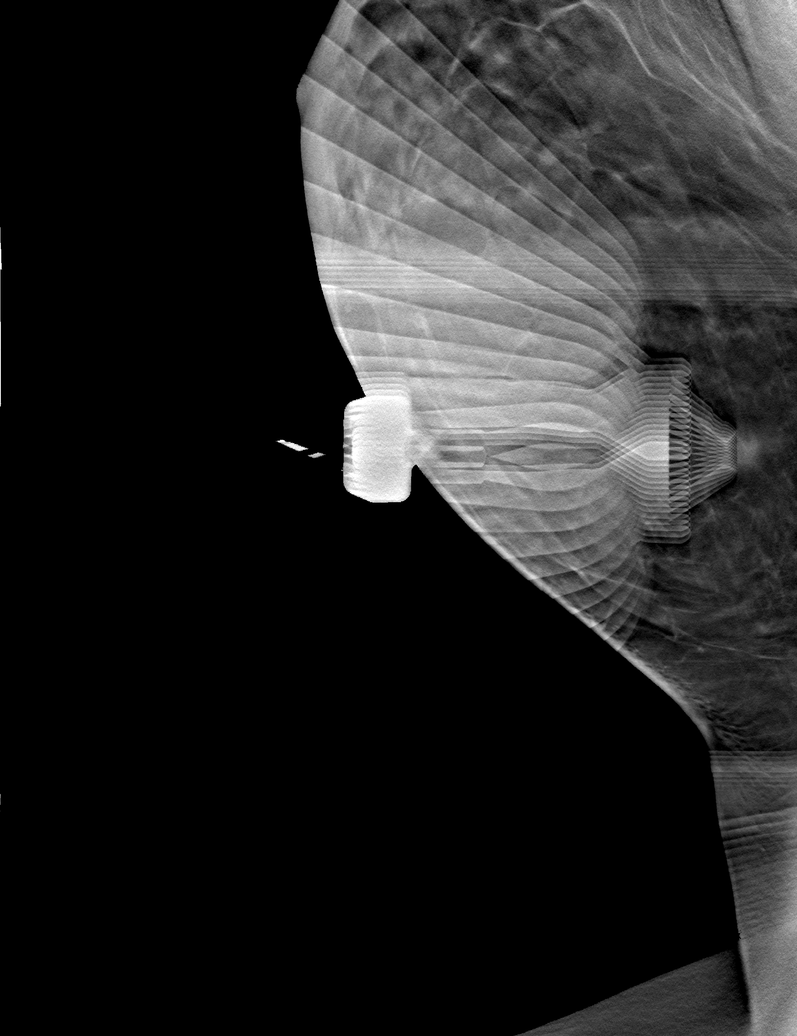
[frame 29/57]
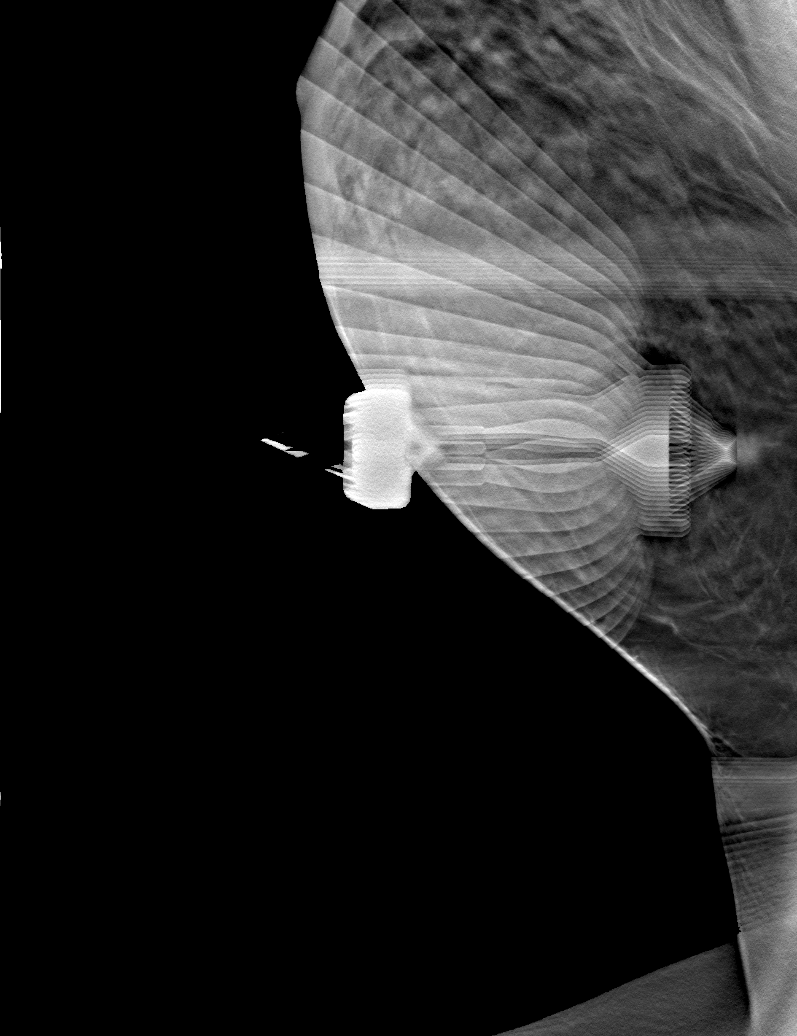

[R LM tomo (2 of 2) · tomo slice 29/57.0]
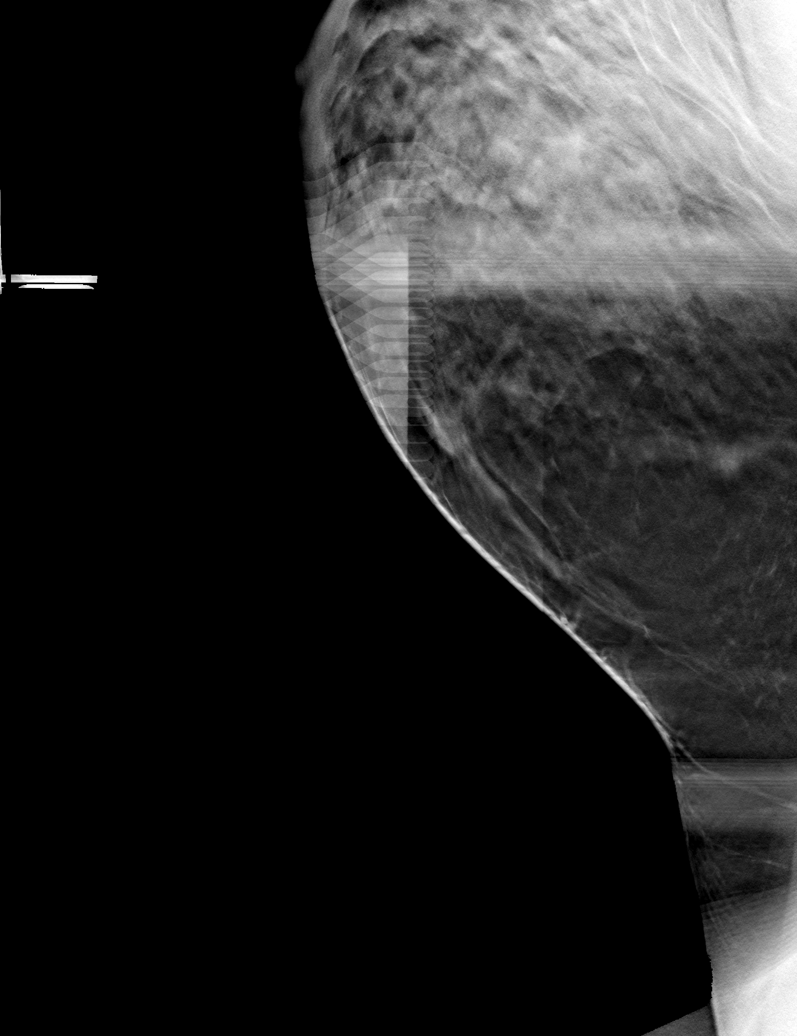

[6 of 13 positions shown; findings below may reference images not displayed]



Using sterile technique and 2% Lidocaine as local anesthetic, under
stereotactic guidance, a 9 gauge vacuum assisted device was used to
perform core needle biopsy of the area of asymmetry/distortion
upper-outer quadrant of the right breast, at posterior depth, using
a lateral approach.

At the conclusion of the procedure, a X shaped tissue marker clip
was deployed into the biopsy cavity. Follow-up 2-view mammogram was
performed and dictated separately.
IMPRESSION: Stereotactic-guided biopsy of the right breast asymmetry/distortion
in the upper-outer quadrant, at posterior depth, using a lateral
approach. No apparent complications.

## 2017-12-06 IMAGING — MG MM DIAG BREAST TOMO UNI RIGHT
4 series · 4 of 12 positions shown · non-contrast
Comparison: Previous exam(s).

CLINICAL DATA: Status post stereotactic-guided biopsy for right
breast asymmetry/distortion performed earlier today.

EXAM:
DIAGNOSTIC RIGHT MAMMOGRAM POST STEREOTACTIC BIOPSY

[R XCCL]
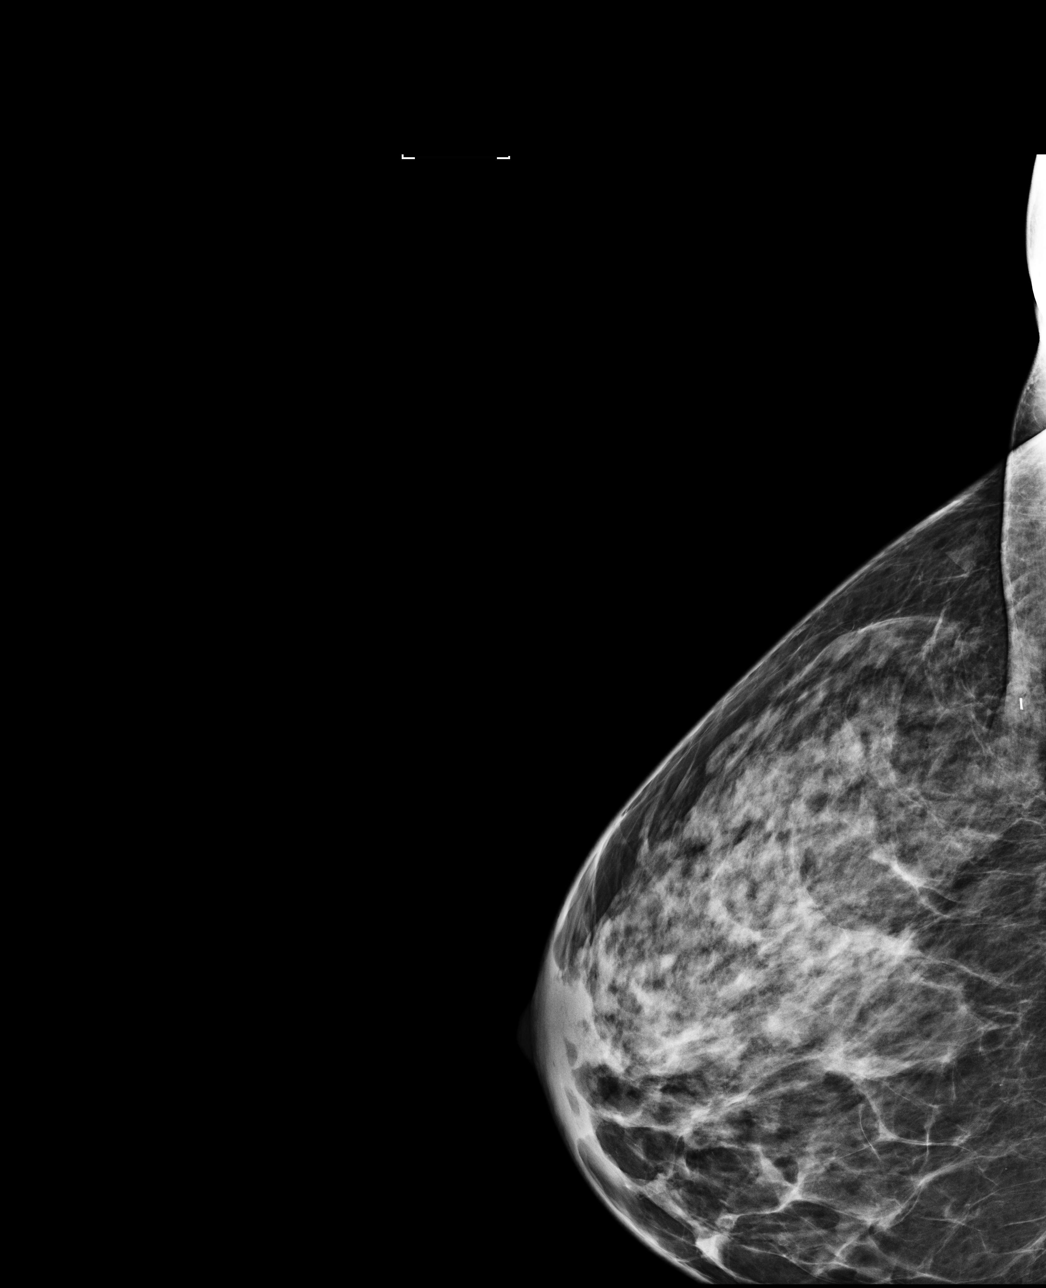

[R LM]
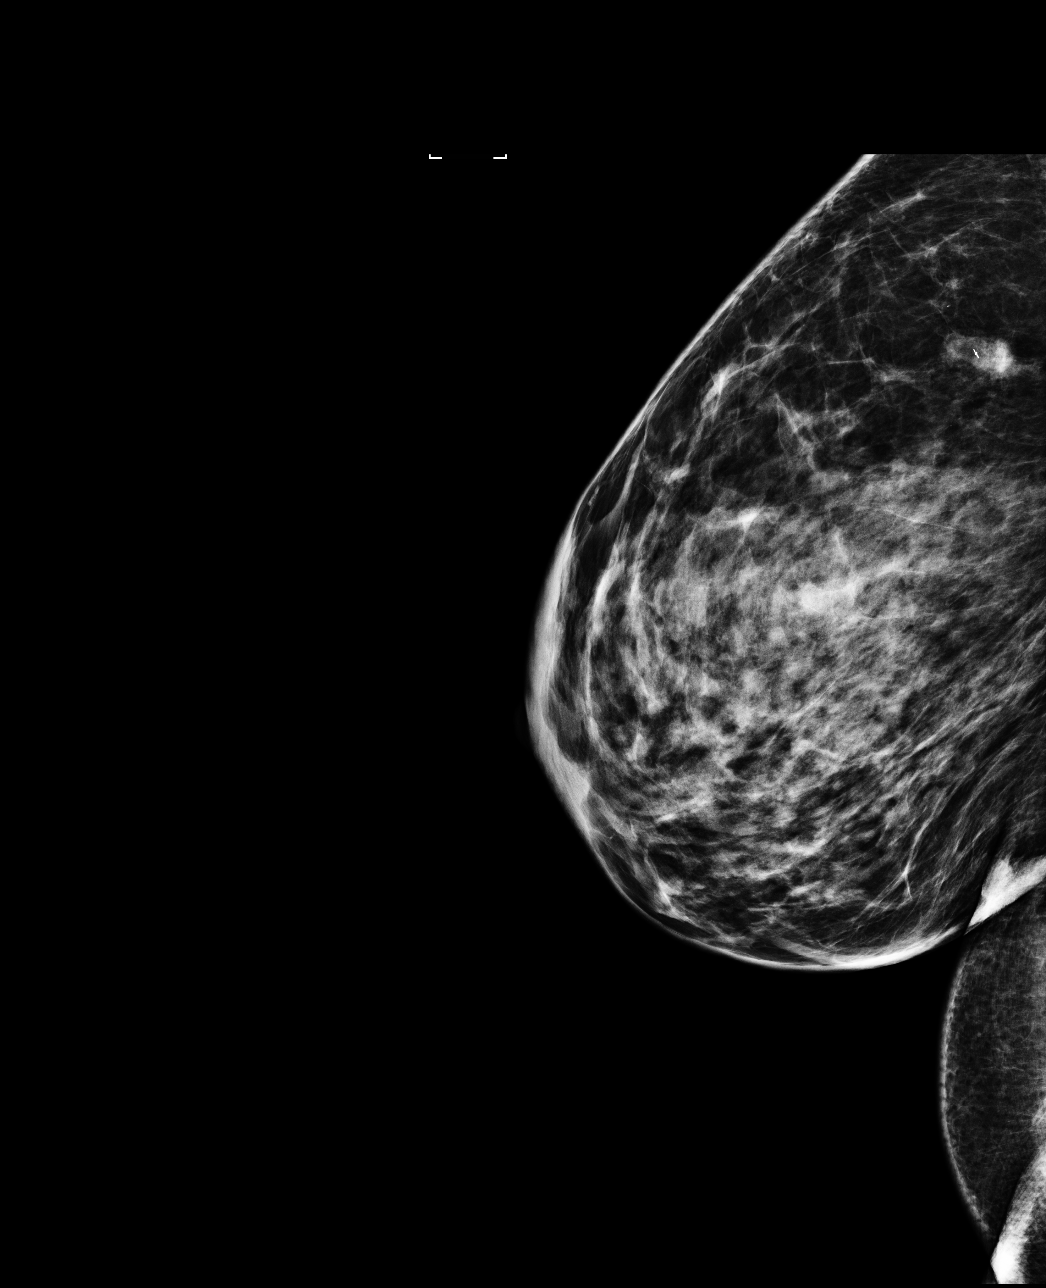

[R LM tomo · tomo slice 33/65.0]
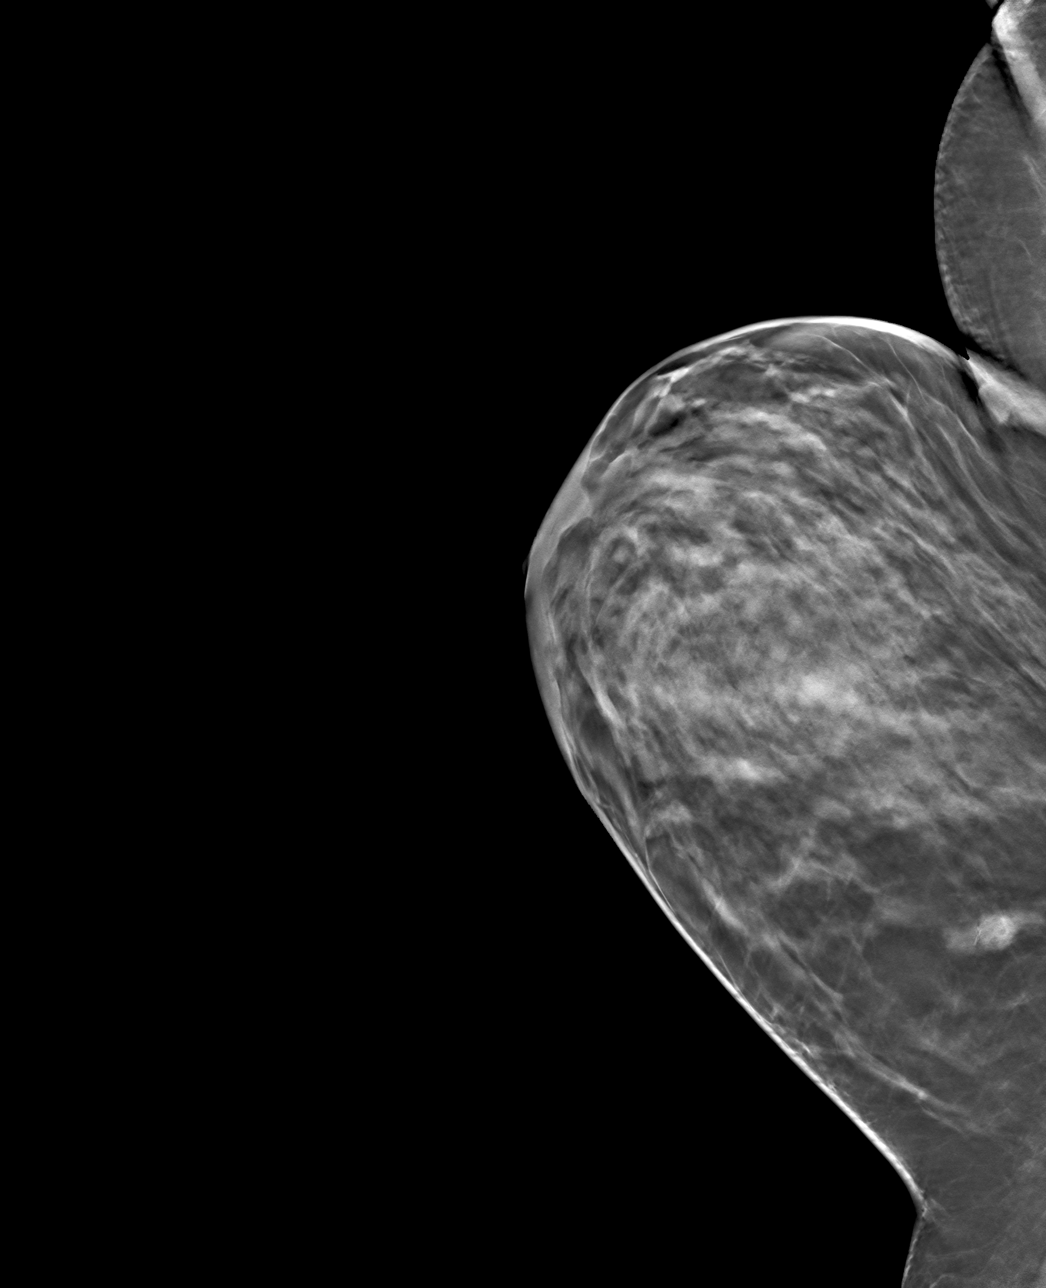

[R XCCL tomo · tomo slice 35/68.0]
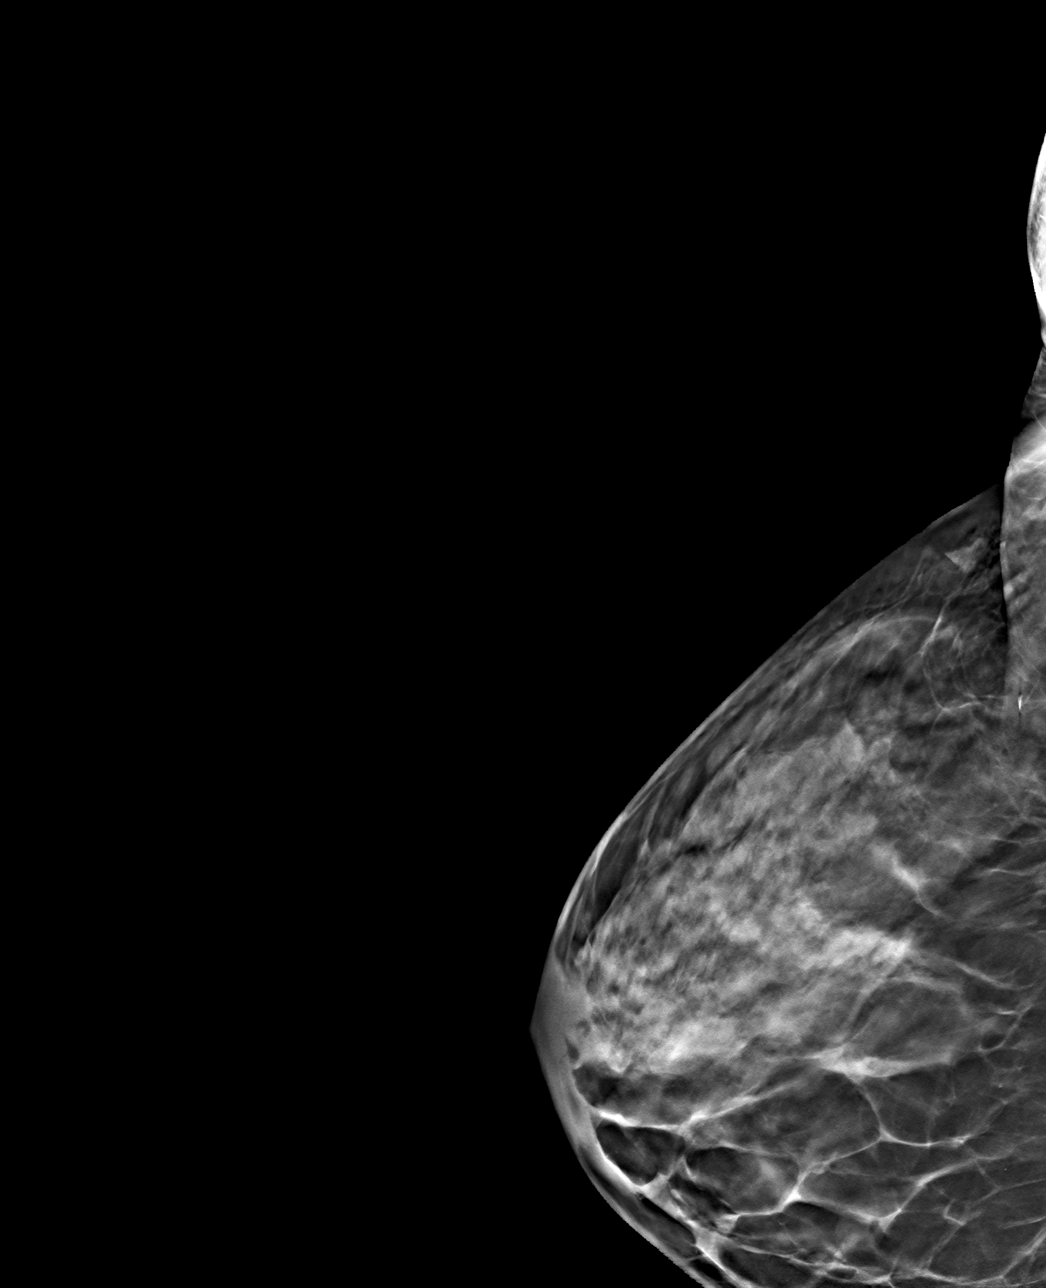

[4 of 12 positions shown; findings below may reference images not displayed]

FINDINGS: Mammographic images were obtained following stereotactic guided
biopsy of the right breast asymmetry/ distortion within the upper
outer quadrant, at posterior depth, using a lateral approach. At the
conclusion of the procedure, an X shaped tissue marker was placed at
the biopsy site. The biopsy clip is well positioned at the site of
asymmetry/distortion.
IMPRESSION: Postprocedure mammogram for clip placement. Biopsy clip is well
positioned at the targeted site of asymmetry/distortion within the
upper-outer quadrant at posterior depth.

Final Assessment: Post Procedure Mammograms for Marker Placement

## 2017-12-08 DIAGNOSIS — C50919 Malignant neoplasm of unspecified site of unspecified female breast: Secondary | ICD-10-CM | POA: Diagnosis not present

## 2017-12-08 DIAGNOSIS — I10 Essential (primary) hypertension: Secondary | ICD-10-CM | POA: Diagnosis not present

## 2017-12-08 DIAGNOSIS — E038 Other specified hypothyroidism: Secondary | ICD-10-CM | POA: Diagnosis not present

## 2017-12-08 DIAGNOSIS — R7301 Impaired fasting glucose: Secondary | ICD-10-CM | POA: Diagnosis not present

## 2017-12-08 DIAGNOSIS — Z1389 Encounter for screening for other disorder: Secondary | ICD-10-CM | POA: Diagnosis not present

## 2017-12-19 ENCOUNTER — Other Ambulatory Visit: Payer: Self-pay | Admitting: Oncology

## 2017-12-22 ENCOUNTER — Other Ambulatory Visit: Payer: BLUE CROSS/BLUE SHIELD

## 2017-12-22 ENCOUNTER — Ambulatory Visit: Payer: BLUE CROSS/BLUE SHIELD | Admitting: Oncology

## 2018-01-05 ENCOUNTER — Inpatient Hospital Stay: Payer: BLUE CROSS/BLUE SHIELD | Attending: Oncology

## 2018-01-05 ENCOUNTER — Inpatient Hospital Stay: Payer: BLUE CROSS/BLUE SHIELD | Admitting: Oncology

## 2018-01-05 ENCOUNTER — Telehealth: Payer: Self-pay | Admitting: Oncology

## 2018-01-05 VITALS — BP 127/70 | HR 81 | Temp 98.3°F | Resp 18 | Ht 68.0 in | Wt 222.7 lb

## 2018-01-05 DIAGNOSIS — Z9221 Personal history of antineoplastic chemotherapy: Secondary | ICD-10-CM | POA: Insufficient documentation

## 2018-01-05 DIAGNOSIS — Z79810 Long term (current) use of selective estrogen receptor modulators (SERMs): Secondary | ICD-10-CM | POA: Insufficient documentation

## 2018-01-05 DIAGNOSIS — Z170 Estrogen receptor positive status [ER+]: Secondary | ICD-10-CM | POA: Insufficient documentation

## 2018-01-05 DIAGNOSIS — Z8049 Family history of malignant neoplasm of other genital organs: Secondary | ICD-10-CM | POA: Diagnosis not present

## 2018-01-05 DIAGNOSIS — E039 Hypothyroidism, unspecified: Secondary | ICD-10-CM

## 2018-01-05 DIAGNOSIS — Z803 Family history of malignant neoplasm of breast: Secondary | ICD-10-CM | POA: Diagnosis not present

## 2018-01-05 DIAGNOSIS — Z800 Family history of malignant neoplasm of digestive organs: Secondary | ICD-10-CM | POA: Insufficient documentation

## 2018-01-05 DIAGNOSIS — C50411 Malignant neoplasm of upper-outer quadrant of right female breast: Secondary | ICD-10-CM | POA: Diagnosis not present

## 2018-01-05 DIAGNOSIS — E785 Hyperlipidemia, unspecified: Secondary | ICD-10-CM | POA: Diagnosis not present

## 2018-01-05 DIAGNOSIS — R232 Flushing: Secondary | ICD-10-CM

## 2018-01-05 DIAGNOSIS — Z87891 Personal history of nicotine dependence: Secondary | ICD-10-CM

## 2018-01-05 DIAGNOSIS — F418 Other specified anxiety disorders: Secondary | ICD-10-CM | POA: Insufficient documentation

## 2018-01-05 DIAGNOSIS — Z808 Family history of malignant neoplasm of other organs or systems: Secondary | ICD-10-CM | POA: Insufficient documentation

## 2018-01-05 DIAGNOSIS — K219 Gastro-esophageal reflux disease without esophagitis: Secondary | ICD-10-CM | POA: Diagnosis not present

## 2018-01-05 DIAGNOSIS — Z79899 Other long term (current) drug therapy: Secondary | ICD-10-CM

## 2018-01-05 DIAGNOSIS — I10 Essential (primary) hypertension: Secondary | ICD-10-CM

## 2018-01-05 DIAGNOSIS — Z7982 Long term (current) use of aspirin: Secondary | ICD-10-CM

## 2018-01-05 DIAGNOSIS — Z923 Personal history of irradiation: Secondary | ICD-10-CM | POA: Insufficient documentation

## 2018-01-05 DIAGNOSIS — Z780 Asymptomatic menopausal state: Secondary | ICD-10-CM

## 2018-01-05 LAB — COMPREHENSIVE METABOLIC PANEL
ALT: 27 U/L (ref 0–55)
AST: 26 U/L (ref 5–34)
Albumin: 4.1 g/dL (ref 3.5–5.0)
Alkaline Phosphatase: 71 U/L (ref 40–150)
Anion gap: 9 (ref 3–11)
BUN: 16 mg/dL (ref 7–26)
CALCIUM: 9.2 mg/dL (ref 8.4–10.4)
CHLORIDE: 102 mmol/L (ref 98–109)
CO2: 25 mmol/L (ref 22–29)
Creatinine, Ser: 0.83 mg/dL (ref 0.60–1.10)
GFR calc non Af Amer: >60 mL/min (ref >60)
Glucose, Bld: 139 mg/dL (ref 70–140)
POTASSIUM: 4.1 mmol/L (ref 3.5–5.1)
SODIUM: 136 mmol/L (ref 136–145)
Total Bilirubin: 0.3 mg/dL (ref 0.2–1.2)
Total Protein: 7.7 g/dL (ref 6.4–8.3)

## 2018-01-05 LAB — CBC WITH DIFFERENTIAL/PLATELET
Basophils Absolute: 0.1 10*3/uL (ref 0.0–0.1)
Basophils Relative: 1 %
EOS ABS: 0.1 10*3/uL (ref 0.0–0.5)
Eosinophils Relative: 2 %
HEMATOCRIT: 37.5 % (ref 34.8–46.6)
HEMOGLOBIN: 13 g/dL (ref 11.6–15.9)
LYMPHS ABS: 2.1 10*3/uL (ref 0.9–3.3)
LYMPHS PCT: 35 %
MCH: 34.9 pg — AB (ref 25.1–34.0)
MCHC: 34.7 g/dL (ref 31.5–36.0)
MCV: 100.6 fL (ref 79.5–101.0)
MONOS PCT: 7 %
Monocytes Absolute: 0.4 10*3/uL (ref 0.1–0.9)
NEUTROS ABS: 3.2 10*3/uL (ref 1.5–6.5)
NEUTROS PCT: 55 %
Platelets: 202 10*3/uL (ref 145–400)
RBC: 3.72 MIL/uL (ref 3.70–5.45)
RDW: 12.3 % (ref 11.2–14.5)
WBC: 5.9 10*3/uL (ref 3.9–10.3)

## 2018-01-05 NOTE — Progress Notes (Signed)
[place]  [phone] [phone]   ID: Tracy Hart DOB: 1961-03-18  MR#: [id number]  XBD#:[id number]  Patient Care Team: Prince Solian, MD as PCP - General (Internal Medicine) Maisie Fus, MD as Consulting Physician (Obstetrics and Gynecology) Olaf Mesa, Virgie Dad, MD as Consulting Physician (Oncology) [name], MD as Consulting Physician (General Surgery) [name], MD as Consulting Physician (Radiation Oncology) PCP: Prince Solian, MD OTHER MD:  CHIEF COMPLAINT: Estrogen receptor positive breast cancer  CURRENT TREATMENT: Tamoxifen  BREAST CANCER HISTORY: From the original intake note:  "Tracy Hart" had routine screening mammography at Dr. [name] office late November, showing a possible mass in the right breast. On 08/04/2015 she underwent right diagnostic mammography with tomosynthesis and right breast ultrasonography at the breast Center. The breast density was category C. In the upper outer quadrant of the right breast there was a 0.8 cm area of asymmetry which was not palpable by exam. Ultrasound showed no suspicious mass or calcifications.  Biopsy of the area of asymmetry was performed 08/19/2015, and showed ([id number]) invasive and in situ ductal carcinoma, E-cadherin positive, estrogen receptor 90% positive, progesterone receptor 90% positive, both with strong staining intensity, with an MIB-1 of 5%, and no HER-2 amplification, the signals ratio being 1.44 and the number per cell 1.80.  The patient was then referred to surgery and after appropriate discussion underwent right lumpectomy and sentinel lymph node sampling 09/01/2015. The pathology from this procedure ([id number]) confirmed an invasive ductal carcinoma, grade 2, measuring 1.7 cm, with some low-grade ductal carcinoma in situ. Margins were negative. All 5 sentinel lymph nodes were clear.  The patient's subsequent history is as detailed below.  INTERVAL  HISTORY: Tracy Hart returns today for follow-up of her estrogen receptor positive breast cancer.   She continues on tamoxifen, which she tolerates relatively well. She does experience hot flashes, which were worse when she was in [place] due to the heat and humidity. She became dehydrated at first, before realizing she needed to drink more water. She denies vaginal wetness or discharge.  Since her last visit here on 12/06/2016, she underwent a diagnostic bilateral breast mammogram with TOMO, 07/22/2017, breast density category C, showing no evidence of malignancy in either breast, status post right lumpectomy.   REVIEW OF SYSTEMS: Tracy Hart is doing well overall. However, she reports her mother passed away in 11-17-2017, which has been hard for her. Her family went on vacation in [place]. She is focusing on her health and weight. When she is in a routine it is easy, but when she gets out of her routine it is hard for her to continue eating well and exercising. She does walk during the week and exercises on the weekend. Her biggest issue is portion control, adding she will normally eat protein, fruits and vegetables. Occasionally she will have ice cream, but adds her husband is diabetic too and that they try to avoid it. She denies unusual headaches, visual changes, nausea, vomiting, or dizziness. There has been no unusual cough, phlegm production, or pleurisy. This been no change in bowel or bladder habits. She denies unexplained fatigue or unexplained weight loss, bleeding, rash, or fever. A detailed review of systems was otherwise noncontributory.   PAST MEDICAL HISTORY: Past Medical History:  Diagnosis Date  . Anxiety    takes Xanax daily as needed  . Breast cancer ([place]) 08/19/15   right breast  . Cancer ([place]) 2016   right breast  . Depression    takes Lexapro daily  .  Family history of breast cancer   . Family history of uterine cancer   . GERD (gastroesophageal reflux disease)   . History of  bronchitis 2000  . History of hiatal hernia   . Hyperlipidemia    takes Atorvastatin daily  . Hypertension    takes Benicar daily  . Hypothyroidism    takes Synthroid daily  . Insomnia    takes Trazodone nightly  . Personal history of chemotherapy   . Personal history of radiation therapy   . Seasonal allergies    takes Zyrtec and uses Flonase daily    PAST SURGICAL HISTORY: Past Surgical History:  Procedure Laterality Date  . BREAST LUMPECTOMY WITH RADIOACTIVE SEED AND SENTINEL LYMPH NODE BIOPSY Right 09/01/2015   Procedure: RIGHT BREAST LUMPECTOMY WITH RADIOACTIVE SEED LOCALIZED AND SENTINEL LYMPH NODE BIOPSY;  Surgeon: [name], MD;  Location: [facility];  Service: General;  Laterality: Right;  . COLONOSCOPY    . ESOPHAGOGASTRODUODENOSCOPY    . HERNIA REPAIR Right 2009  . PORT-A-CATH REMOVAL Left 03/29/2016   Procedure: REMOVAL PORT-A-CATH;  Surgeon: [name], MD;  Location: [facility];  Service: General;  Laterality: Left;  . PORTACATH PLACEMENT Left 09/29/2015   Procedure: INSERTION PORT-A-CATH;  Surgeon: [name], MD;  Location: [facility];  Service: General;  Laterality: Left;  . TONSILLECTOMY  at age 64    FAMILY HISTORY Family History  Problem Relation Age of Onset  . Cancer Mother 43       breast  . Diabetes Mother   . Hypertension Mother   . Hypertension Father   . Cancer Sister 74       thyroid  . Seizures Sister   . Uterine cancer Maternal Grandmother 67  . Vaginal cancer Maternal Grandmother        dx in her 72s  . Heart disease Paternal Grandfather   . Breast cancer Sister 31       DCIS; negative genetic testing  . Colon cancer Paternal Aunt 82  . Breast cancer Cousin 80       paternal first cousin   The patient's parents are in their early 23s as of January 2017. The patient's mother was 36 when she died in Dec 10, 2017. The patient had no brothers, 3 sisters. The patient's mother was diagnosed with breast  cancer at age 31. The patient's sister was diagnosed with thyroid cancer at age 26. There is no history of ovarian cancer in the family.   GYNECOLOGIC HISTORY:  No LMP recorded. Patient is postmenopausal. Menarche age 26, the patient is GX P0. She was on oral contraceptives between 1996 and 2016, and at the time of her diagnosis had been on hormone replacement for 2 months.   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 Her husband [name]") works in Occupational hygienist for a Technical sales engineer. At home is just the 2 of them, with no pets. The patient at tends [place]    ADVANCED DIRECTIVES: In place. The patient's husband is her healthcare power of attorney with her sister [name] listed as secondary   HEALTH MAINTENANCE: Social History   Tobacco Use  . Smoking status: Former Smoker    Packs/day: 0.50    Types: Cigarettes    Last attempt to quit: 01/29/1989    Years since quitting: 28.9  . Smokeless tobacco: Never Used  . Tobacco comment: quit smoking around 1990  Substance Use Topics  .  Alcohol use: No  . Drug use: No     Colonoscopy: 2010/[facility]  PAP: November 2016  Bone density: Due/ at Dr [name]  Lipid panel:  Allergies  Allergen Reactions  . Codeine     No percocet, makes patient weird  . Sulfa Antibiotics Other (See Comments)    Caused mouth sores    Current Outpatient Medications  Medication Sig Dispense Refill  . aspirin 81 MG tablet Take 81 mg by mouth daily. Reported on 02/20/2016    . atorvastatin (LIPITOR) 40 MG tablet Take 40 mg by mouth daily.    . cetirizine (ZYRTEC) 10 MG tablet Take 10 mg by mouth daily.    . cholecalciferol (VITAMIN D) 400 UNITS TABS tablet Take 800 Units by mouth 2 (two) times daily.    Marland Kitchen escitalopram (LEXAPRO) 10 MG tablet Take 10 mg by mouth daily.    . fluticasone (FLONASE) 50 MCG/ACT nasal spray Place 1 spray into both nostrils daily. Reported on 11/25/2015    . gabapentin  (NEURONTIN) 300 MG capsule TAKE 1 CAPSULE BY MOUTH AT BEDTIME 90 capsule 0  . levothyroxine (SYNTHROID, LEVOTHROID) 137 MCG tablet Take 137 mcg by mouth daily before breakfast.    . Multiple Vitamin (MULTIVITAMIN WITH MINERALS) TABS tablet Take 1 tablet by mouth daily.    Marland Kitchen olmesartan-hydrochlorothiazide (BENICAR HCT) 40-25 MG per tablet Take 1 tablet by mouth daily. Reported on 02/13/2016    . Omega-3 Fatty Acids (FISH OIL) 1200 MG CAPS Take by mouth 2 (two) times daily. Reported on 02/13/2016    . tamoxifen (NOLVADEX) 20 MG tablet TAKE 1 TABLET BY MOUTH ONCE DAILY 30 tablet 0  . traZODone (DESYREL) 50 MG tablet Take 50 mg by mouth at bedtime. Reported on 12/02/2015     No current facility-administered medications for this visit.     OBJECTIVE: Middle-aged white woman in no acute distress  Vitals:   01/05/18 0859  BP: 127/70  Pulse: 81  Resp: 18  Temp: 98.3 F (36.8 C)  SpO2: 99%     Body mass index is 33.86 kg/m.    ECOG FS:0 - Asymptomatic  Sclerae unicteric, EOMs intact Oropharynx clear and moist No cervical or supraclavicular adenopathy Lungs no rales or rhonchi Heart regular rate and rhythm Abd soft, nontender, positive bowel sounds MSK no focal spinal tenderness, no upper extremity lymphedema Neuro: nonfocal, well oriented, appropriate affect Breasts: Right breast is status post lumpectomy and radiation.  There is no evidence of local recurrence.  The left breast is benign.  Both axillae are benign.   LAB RESULTS:  CMP     Component Value Date/Time   NA 139 12/06/2016 1029   K 4.6 12/06/2016 1029   CL 105 09/26/2015 0836   CO2 27 12/06/2016 1029   GLUCOSE 97 12/06/2016 1029   BUN 12.7 12/06/2016 1029   CREATININE 0.8 12/06/2016 1029   CALCIUM 9.8 12/06/2016 1029   PROT 7.5 12/06/2016 1029   ALBUMIN 4.3 12/06/2016 1029   AST 20 12/06/2016 1029   ALT 20 12/06/2016 1029   ALKPHOS 71 12/06/2016 1029   BILITOT 0.32 12/06/2016 1029   GFRNONAA >60 09/26/2015 0836    GFRAA >60 09/26/2015 0836    INo results found for: SPEP, UPEP  Lab Results  Component Value Date   WBC 5.9 01/05/2018   NEUTROABS 3.2 01/05/2018   HGB 13.0 01/05/2018   HCT 37.5 01/05/2018   MCV 100.6 01/05/2018   PLT 202 01/05/2018      Chemistry  Component Value Date/Time   NA 139 12/06/2016 1029   K 4.6 12/06/2016 1029   CL 105 09/26/2015 0836   CO2 27 12/06/2016 1029   BUN 12.7 12/06/2016 1029   CREATININE 0.8 12/06/2016 1029      Component Value Date/Time   CALCIUM 9.8 12/06/2016 1029   ALKPHOS 71 12/06/2016 1029   AST 20 12/06/2016 1029   ALT 20 12/06/2016 1029   BILITOT 0.32 12/06/2016 1029       No results found for: LABCA2  No components found for: LABCA125  No results for input(s): INR in the last 168 hours.  Urinalysis    Component Value Date/Time   LABSPEC 1.005 10/14/2015 0933   PHURINE 6.0 10/14/2015 0933   GLUCOSEU Negative 10/14/2015 0933   HGBUR Negative 10/14/2015 0933   BILIRUBINUR Negative 10/14/2015 0933   KETONESUR Negative 10/14/2015 0933   PROTEINUR Negative 10/14/2015 0933   UROBILINOGEN 0.2 10/14/2015 0933   NITRITE Negative 10/14/2015 0933   LEUKOCYTESUR Negative 10/14/2015 0933    STUDIES: She underwent a diagnostic bilateral breast mammogram with TOMO, 07/22/2017, breast density category C, showing no evidence of malignancy in either breast, status post right lumpectomy.  ASSESSMENT: 57 y.o. status post right breast upper outer quadrant biopsy 08/19/2015 for a clinical T1b N0, stage IA invasive ductal carcinoma, estrogen and progesterone receptor positive, HER-2 not amplified, with an MIB-1 of 5%  (1) status post right lumpectomy and sentinel lymph node sampling 09/01/2015 for a pT1c pN0, stage IA invasive ductal carcinoma, grade 2, with negative margins; repeat HER-2 again negative  (2) Oncotype DX score of 23 falls in the intermediate range, and predicts a risk of recurrence outside the breast within 10 years of 15%,  if the patient's only systemic therapy is tamoxifen for 5 years. It also predicts an absolute risk reduction of 4% with added chemotherapy (to a final 11% risk)  (3) cyclophosphamide and docetaxel x 4, given every 21 days with neulasta onpro started 10/06/15, completed 12/16/2015  (4) adjuvant radiation  01/26/2016 to 03/12/2016 1. The Right breast was treated to 50.4 Gy in 28 fractions at 1.8 Gy per fraction.  2. The Right breast was boosted to 10 Gy in 5 fractions at 2 Gy per fraction.  (5) tamoxifen started 03/23/2016  (a) bone density pending   (6) genetics testing 04/16/2016 through the Custom gene panel offered by [facility]  found no deleterious mutations in ATM, BARD1, BRCA1, BRCA2, BRIP1, CDH1, CHEK2, EPCAM, FANCC, MLH1, MSH2, MSH6, MUTYH, NBN, PALB2, PMS2, POLD1, PTEN, RAD51C, RAD51D, TP53, and XRCC2.     PLAN: Tracy Hart is now a little over 2 years out from definitive surgery for her breast cancer with no evidence of disease recurrence.  This is very favorable.  She is tolerating tamoxifen generally well and the plan will be to continue that for a total of 5 years.  Today we went into diet issues and I encouraged her to avoid the carbohydrates, which will also be helpful to her husband as we discussed today  We will see him again in 1 year.  She knows to call for any issues that may develop before that visit.  Levy Wellman, Virgie Dad, MD  01/05/18 9:33 AM Medical Oncology and Hematology [facility] [address] [place], [place] [address] Tel. [phone]    Fax. [phone]  This document serves as a record of services personally performed by [name], MD. It was created on his behalf by Margit Banda, a trained medical  scribe. The creation of this record is based on the scribe's personal observations and the provider's statements to them.   I have reviewed the above documentation for accuracy and completeness, and I agree with the above.

## 2018-01-05 NOTE — Telephone Encounter (Signed)
Gave patient avs report and appointments for May 2020. °

## 2018-01-23 ENCOUNTER — Other Ambulatory Visit: Payer: Self-pay | Admitting: Oncology

## 2018-02-10 ENCOUNTER — Other Ambulatory Visit: Payer: Self-pay | Admitting: Oncology

## 2018-02-21 ENCOUNTER — Other Ambulatory Visit: Payer: Self-pay | Admitting: Oncology

## 2018-03-23 ENCOUNTER — Other Ambulatory Visit: Payer: Self-pay | Admitting: Oncology

## 2018-04-25 ENCOUNTER — Other Ambulatory Visit: Payer: Self-pay | Admitting: Oncology

## 2018-05-16 ENCOUNTER — Other Ambulatory Visit: Payer: Self-pay | Admitting: Oncology

## 2018-05-24 ENCOUNTER — Other Ambulatory Visit: Payer: Self-pay | Admitting: Oncology

## 2018-05-31 ENCOUNTER — Other Ambulatory Visit: Payer: Self-pay | Admitting: Oncology

## 2018-05-31 DIAGNOSIS — Z853 Personal history of malignant neoplasm of breast: Secondary | ICD-10-CM

## 2018-06-23 ENCOUNTER — Other Ambulatory Visit: Payer: Self-pay | Admitting: Oncology

## 2018-07-03 DIAGNOSIS — Z0000 Encounter for general adult medical examination without abnormal findings: Secondary | ICD-10-CM | POA: Diagnosis not present

## 2018-07-03 DIAGNOSIS — R7301 Impaired fasting glucose: Secondary | ICD-10-CM | POA: Diagnosis not present

## 2018-07-03 DIAGNOSIS — M859 Disorder of bone density and structure, unspecified: Secondary | ICD-10-CM | POA: Diagnosis not present

## 2018-07-03 DIAGNOSIS — R82998 Other abnormal findings in urine: Secondary | ICD-10-CM | POA: Diagnosis not present

## 2018-07-03 DIAGNOSIS — E038 Other specified hypothyroidism: Secondary | ICD-10-CM | POA: Diagnosis not present

## 2018-07-03 DIAGNOSIS — I10 Essential (primary) hypertension: Secondary | ICD-10-CM | POA: Diagnosis not present

## 2018-07-10 DIAGNOSIS — E7849 Other hyperlipidemia: Secondary | ICD-10-CM | POA: Diagnosis not present

## 2018-07-10 DIAGNOSIS — R7301 Impaired fasting glucose: Secondary | ICD-10-CM | POA: Diagnosis not present

## 2018-07-10 DIAGNOSIS — I10 Essential (primary) hypertension: Secondary | ICD-10-CM | POA: Diagnosis not present

## 2018-07-10 DIAGNOSIS — Z1389 Encounter for screening for other disorder: Secondary | ICD-10-CM | POA: Diagnosis not present

## 2018-07-10 DIAGNOSIS — E038 Other specified hypothyroidism: Secondary | ICD-10-CM | POA: Diagnosis not present

## 2018-07-10 DIAGNOSIS — Z0000 Encounter for general adult medical examination without abnormal findings: Secondary | ICD-10-CM | POA: Diagnosis not present

## 2018-07-11 DIAGNOSIS — Z1212 Encounter for screening for malignant neoplasm of rectum: Secondary | ICD-10-CM | POA: Diagnosis not present

## 2018-07-23 ENCOUNTER — Other Ambulatory Visit: Payer: Self-pay | Admitting: Oncology

## 2018-07-24 ENCOUNTER — Ambulatory Visit
Admission: RE | Admit: 2018-07-24 | Discharge: 2018-07-24 | Disposition: A | Discharge status: Home / Self Care | Payer: BLUE CROSS/BLUE SHIELD | Source: Ambulatory Visit | Attending: Oncology | Admitting: Oncology

## 2018-07-24 DIAGNOSIS — Z803 Family history of malignant neoplasm of breast: Secondary | ICD-10-CM | POA: Diagnosis not present

## 2018-07-24 DIAGNOSIS — R922 Inconclusive mammogram: Secondary | ICD-10-CM | POA: Diagnosis not present

## 2018-07-24 DIAGNOSIS — Z853 Personal history of malignant neoplasm of breast: Secondary | ICD-10-CM

## 2018-07-28 DIAGNOSIS — C50911 Malignant neoplasm of unspecified site of right female breast: Secondary | ICD-10-CM | POA: Diagnosis not present

## 2018-07-31 DIAGNOSIS — Z01419 Encounter for gynecological examination (general) (routine) without abnormal findings: Secondary | ICD-10-CM | POA: Diagnosis not present

## 2018-07-31 DIAGNOSIS — Z6835 Body mass index (BMI) 35.0-35.9, adult: Secondary | ICD-10-CM | POA: Diagnosis not present

## 2018-08-18 ENCOUNTER — Other Ambulatory Visit: Payer: Self-pay | Admitting: Oncology

## 2018-08-20 ENCOUNTER — Other Ambulatory Visit: Payer: Self-pay | Admitting: Oncology

## 2018-08-23 HISTORY — PX: BREAST BIOPSY: SHX20

## 2018-09-05 DIAGNOSIS — F418 Other specified anxiety disorders: Secondary | ICD-10-CM | POA: Diagnosis not present

## 2018-09-05 DIAGNOSIS — Z6835 Body mass index (BMI) 35.0-35.9, adult: Secondary | ICD-10-CM | POA: Diagnosis not present

## 2018-09-05 DIAGNOSIS — I10 Essential (primary) hypertension: Secondary | ICD-10-CM | POA: Diagnosis not present

## 2018-10-17 DIAGNOSIS — F418 Other specified anxiety disorders: Secondary | ICD-10-CM | POA: Diagnosis not present

## 2018-10-17 DIAGNOSIS — I10 Essential (primary) hypertension: Secondary | ICD-10-CM | POA: Diagnosis not present

## 2018-10-17 DIAGNOSIS — Z6833 Body mass index (BMI) 33.0-33.9, adult: Secondary | ICD-10-CM | POA: Diagnosis not present

## 2018-11-21 ENCOUNTER — Other Ambulatory Visit: Payer: Self-pay | Admitting: Oncology

## 2018-11-27 DIAGNOSIS — H5789 Other specified disorders of eye and adnexa: Secondary | ICD-10-CM | POA: Diagnosis not present

## 2018-11-27 DIAGNOSIS — I10 Essential (primary) hypertension: Secondary | ICD-10-CM | POA: Diagnosis not present

## 2018-11-27 DIAGNOSIS — H0013 Chalazion right eye, unspecified eyelid: Secondary | ICD-10-CM | POA: Diagnosis not present

## 2018-12-04 DIAGNOSIS — H00031 Abscess of right upper eyelid: Secondary | ICD-10-CM | POA: Diagnosis not present

## 2018-12-04 DIAGNOSIS — H05011 Cellulitis of right orbit: Secondary | ICD-10-CM | POA: Diagnosis not present

## 2018-12-04 DIAGNOSIS — H01132 Eczematous dermatitis of right lower eyelid: Secondary | ICD-10-CM | POA: Diagnosis not present

## 2018-12-04 DIAGNOSIS — H5789 Other specified disorders of eye and adnexa: Secondary | ICD-10-CM | POA: Diagnosis not present

## 2018-12-06 DIAGNOSIS — H00031 Abscess of right upper eyelid: Secondary | ICD-10-CM | POA: Diagnosis not present

## 2018-12-06 DIAGNOSIS — H01132 Eczematous dermatitis of right lower eyelid: Secondary | ICD-10-CM | POA: Diagnosis not present

## 2018-12-13 DIAGNOSIS — H00031 Abscess of right upper eyelid: Secondary | ICD-10-CM | POA: Diagnosis not present

## 2018-12-13 DIAGNOSIS — H01132 Eczematous dermatitis of right lower eyelid: Secondary | ICD-10-CM | POA: Diagnosis not present

## 2019-01-01 ENCOUNTER — Other Ambulatory Visit: Payer: Self-pay | Admitting: Oncology

## 2019-01-03 ENCOUNTER — Telehealth: Payer: Self-pay | Admitting: Oncology

## 2019-01-03 NOTE — Telephone Encounter (Signed)
Called patient regarding upcoming Webex appointment, patient is notified and e-mail has been sent. °

## 2019-01-03 NOTE — Telephone Encounter (Signed)
Called regarding upcoming Webex appointment, patient is notified and e-mail has been sent.

## 2019-01-03 NOTE — Progress Notes (Signed)
[place]  [phone] [phone]   ID: Tracy Hart DOB: [birth year]  MR#: [id number]  CLE#:[id number]  Patient Care Team: Prince Solian, MD as PCP - General (Internal Medicine) Maisie Fus, MD as Consulting Physician (Obstetrics and Gynecology) Magrinat, Virgie Dad, MD as Consulting Physician (Oncology) [name], MD as Consulting Physician (General Surgery) [name], MD as Consulting Physician (Radiation Oncology) PCP: Prince Solian, MD OTHER MD:  I connected with Tracy Hart on 01/04/19 at  1:30 PM EDT by video enabled telemedicine visit and verified that I am speaking with the correct person using two identifiers.   I discussed the limitations, risks, security and privacy concerns of performing an evaluation and management service by telemedicine and the availability of in-person appointments. I also discussed with the patient that there may be a patient responsible charge related to this service. The patient expressed understanding and agreed to proceed.   Other persons participating in the visit and their role in the encounter: Wilburn Mylar, scribe   Patient's location: home  Provider's location: [place]    CHIEF COMPLAINT: Estrogen receptor positive breast cancer  CURRENT TREATMENT: Tamoxifen   INTERVAL HISTORY: Tracy Hart returns today for follow-up of her estrogen receptor positive breast cancer.   She continues on tamoxifen, which she tolerates relatively well. She reports hot flashes, sometimes worse than others, but they do not wake her up. She denies vaginal wetness.   Since her last visit, she underwent bilateral diagnostic mammography with tomography at [facility] on 07/24/2018 showing: breast density category C; no evidence of malignancy in either breast.   REVIEW OF SYSTEMS: Tracy Hart reports doing well overall. She states she is working from home, noting she works more hours at  home than she did at the office. She walks daily for exercise; she enjoys getting outside and away from her computer for breaks. She notes she has lost some weight recently with [employer]. Most recent annual physical on 07/10/2018. Her mother-in-law passed away in late July 14, 2018.   The patient denies unusual headaches, visual changes, nausea, vomiting, stiff neck, dizziness, or gait imbalance. There has been no cough, phlegm production, or pleurisy, no chest pain or pressure, and no change in bowel or bladder habits. The patient denies fever, rash, bleeding, unexplained fatigue or unexplained weight loss. A detailed review of systems was otherwise entirely negative.   BREAST CANCER HISTORY: From the original intake note:  "Tracy Hart" had routine screening mammography at Dr. [name] office late November, showing a possible mass in the right breast. On 08/04/2015 she underwent right diagnostic mammography with tomosynthesis and right breast ultrasonography at the breast Center. The breast density was category C. In the upper outer quadrant of the right breast there was a 0.8 cm area of asymmetry which was not palpable by exam. Ultrasound showed no suspicious mass or calcifications.  Biopsy of the area of asymmetry was performed 08/19/2015, and showed ([id number]) invasive and in situ ductal carcinoma, E-cadherin positive, estrogen receptor 90% positive, progesterone receptor 90% positive, both with strong staining intensity, with an MIB-1 of 5%, and no HER-2 amplification, the signals ratio being 1.44 and the number per cell 1.80.  The patient was then referred to surgery and after appropriate discussion underwent right lumpectomy and sentinel lymph node sampling 09/01/2015. The pathology from this procedure ([id number]) confirmed an invasive ductal carcinoma, grade 2, measuring 1.7 cm, with some low-grade ductal carcinoma in situ. Margins were negative. All 5 sentinel lymph  nodes were clear.  The  patient's subsequent history is as detailed below.   PAST MEDICAL HISTORY: Past Medical History:  Diagnosis Date  . Anxiety    takes Xanax daily as needed  . Breast cancer ([place]) 08/19/15   right breast  . Cancer ([place]) 2016   right breast  . Depression    takes Lexapro daily  . Family history of breast cancer   . Family history of uterine cancer   . GERD (gastroesophageal reflux disease)   . History of bronchitis 2000  . History of hiatal hernia   . Hyperlipidemia    takes Atorvastatin daily  . Hypertension    takes Benicar daily  . Hypothyroidism    takes Synthroid daily  . Insomnia    takes Trazodone nightly  . Personal history of chemotherapy   . Personal history of radiation therapy   . Seasonal allergies    takes Zyrtec and uses Flonase daily    PAST SURGICAL HISTORY: Past Surgical History:  Procedure Laterality Date  . BREAST BIOPSY    . BREAST LUMPECTOMY Right 2017  . BREAST LUMPECTOMY WITH RADIOACTIVE SEED AND SENTINEL LYMPH NODE BIOPSY Right 09/01/2015   Procedure: RIGHT BREAST LUMPECTOMY WITH RADIOACTIVE SEED LOCALIZED AND SENTINEL LYMPH NODE BIOPSY;  Surgeon: [name], MD;  Location: [facility];  Service: General;  Laterality: Right;  . COLONOSCOPY    . ESOPHAGOGASTRODUODENOSCOPY    . HERNIA REPAIR Right 2009  . PORT-A-CATH REMOVAL Left 03/29/2016   Procedure: REMOVAL PORT-A-CATH;  Surgeon: [name], MD;  Location: [facility];  Service: General;  Laterality: Left;  . PORTACATH PLACEMENT Left 09/29/2015   Procedure: INSERTION PORT-A-CATH;  Surgeon: [name], MD;  Location: [facility];  Service: General;  Laterality: Left;  . TONSILLECTOMY  at age 23    FAMILY HISTORY Family History  Problem Relation Age of Onset  . Cancer Mother 19       breast  . Diabetes Mother   . Hypertension Mother   . Breast cancer Mother   . Hypertension Father   . Cancer Sister 62       thyroid  . Seizures Sister   . Uterine  cancer Maternal Grandmother 31  . Vaginal cancer Maternal Grandmother        dx in her 62s  . Heart disease Paternal Grandfather   . Breast cancer Sister 10       DCIS; negative genetic testing  . Colon cancer Paternal Aunt 82  . Breast cancer Cousin 3       paternal first cousin   The patient's parents are in their early 63s as of January 2017. The patient's mother was 2 when she died in December 13, 2017. The patient had no brothers, 3 sisters. The patient's mother was diagnosed with breast cancer at age 31. The patient's sister was diagnosed with thyroid cancer at age 50. There is no history of ovarian cancer in the family.   GYNECOLOGIC HISTORY:  No LMP recorded. Patient is postmenopausal. Menarche age 34, the patient is GX P0. She was on oral contraceptives between 1996 and 2016, and at the time of her diagnosis had been on hormone replacement for 2 months.   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 Her husband [name]") works in Occupational hygienist for a Technical sales engineer. At home is just the 2 of them, with no pets. The patient at tends [place]  ADVANCED DIRECTIVES: In place. The patient's husband is her healthcare power of attorney with her sister [name] listed as secondary   HEALTH MAINTENANCE: Social History   Tobacco Use  . Smoking status: Former Smoker    Packs/day: 0.50    Types: Cigarettes    Last attempt to quit: 01/29/1989    Years since quitting: 29.9  . Smokeless tobacco: Never Used  . Tobacco comment: quit smoking around 1990  Substance Use Topics  . Alcohol use: No  . Drug use: No     Colonoscopy: 2010/[facility]  PAP: 07/10/2018  Bone density: Due/ at Dr [name]  Lipid panel:  Allergies  Allergen Reactions  . Codeine     No percocet, makes patient weird  . Sulfa Antibiotics Other (See Comments)    Caused mouth sores    Current Outpatient Medications  Medication Sig Dispense Refill  .  aspirin 81 MG tablet Take 81 mg by mouth daily. Reported on 02/20/2016    . atorvastatin (LIPITOR) 40 MG tablet Take 40 mg by mouth daily.    . cetirizine (ZYRTEC) 10 MG tablet Take 10 mg by mouth daily.    . cholecalciferol (VITAMIN D) 400 UNITS TABS tablet Take 800 Units by mouth 2 (two) times daily.    Marland Kitchen escitalopram (LEXAPRO) 10 MG tablet Take 10 mg by mouth daily.    . fluticasone (FLONASE) 50 MCG/ACT nasal spray Place 1 spray into both nostrils daily. Reported on 11/25/2015    . gabapentin (NEURONTIN) 300 MG capsule TAKE 1 CAPSULE BY MOUTH AT BEDTIME 90 capsule 0  . levothyroxine (SYNTHROID, LEVOTHROID) 137 MCG tablet Take 137 mcg by mouth daily before breakfast.    . Multiple Vitamin (MULTIVITAMIN WITH MINERALS) TABS tablet Take 1 tablet by mouth daily.    Marland Kitchen olmesartan-hydrochlorothiazide (BENICAR HCT) 40-25 MG per tablet Take 1 tablet by mouth daily. Reported on 02/13/2016    . Omega-3 Fatty Acids (FISH OIL) 1200 MG CAPS Take by mouth 2 (two) times daily. Reported on 02/13/2016    . tamoxifen (NOLVADEX) 20 MG tablet TAKE 1 TABLET BY MOUTH EVERY DAY 30 tablet 2  . traZODone (DESYREL) 50 MG tablet Take 50 mg by mouth at bedtime. Reported on 12/02/2015     No current facility-administered medications for this visit.     OBJECTIVE: Middle-aged white woman who appears well  Vitals:   01/04/19 1341  BP: 112/74  Pulse: 76     Body mass index is 30.84 kg/m.    ECOG FS:0 - Asymptomatic   LAB RESULTS:  CMP     Component Value Date/Time   NA 136 01/05/2018 0845   NA 139 12/06/2016 1029   K 4.1 01/05/2018 0845   K 4.6 12/06/2016 1029   CL 102 01/05/2018 0845   CO2 25 01/05/2018 0845   CO2 27 12/06/2016 1029   GLUCOSE 139 01/05/2018 0845   GLUCOSE 97 12/06/2016 1029   BUN 16 01/05/2018 0845   BUN 12.7 12/06/2016 1029   CREATININE 0.83 01/05/2018 0845   CREATININE 0.8 12/06/2016 1029   CALCIUM 9.2 01/05/2018 0845   CALCIUM 9.8 12/06/2016 1029   PROT 7.7 01/05/2018 0845   PROT 7.5  12/06/2016 1029   ALBUMIN 4.1 01/05/2018 0845   ALBUMIN 4.3 12/06/2016 1029   AST 26 01/05/2018 0845   AST 20 12/06/2016 1029   ALT 27 01/05/2018 0845   ALT 20 12/06/2016 1029   ALKPHOS 71 01/05/2018 0845   ALKPHOS 71 12/06/2016 1029   BILITOT 0.3  01/05/2018 0845   BILITOT 0.32 12/06/2016 1029   GFRNONAA >60 01/05/2018 0845   GFRAA >60 01/05/2018 0845    INo results found for: SPEP, UPEP  Lab Results  Component Value Date   WBC 5.9 01/05/2018   NEUTROABS 3.2 01/05/2018   HGB 13.0 01/05/2018   HCT 37.5 01/05/2018   MCV 100.6 01/05/2018   PLT 202 01/05/2018      Chemistry      Component Value Date/Time   NA 136 01/05/2018 0845   NA 139 12/06/2016 1029   K 4.1 01/05/2018 0845   K 4.6 12/06/2016 1029   CL 102 01/05/2018 0845   CO2 25 01/05/2018 0845   CO2 27 12/06/2016 1029   BUN 16 01/05/2018 0845   BUN 12.7 12/06/2016 1029   CREATININE 0.83 01/05/2018 0845   CREATININE 0.8 12/06/2016 1029      Component Value Date/Time   CALCIUM 9.2 01/05/2018 0845   CALCIUM 9.8 12/06/2016 1029   ALKPHOS 71 01/05/2018 0845   ALKPHOS 71 12/06/2016 1029   AST 26 01/05/2018 0845   AST 20 12/06/2016 1029   ALT 27 01/05/2018 0845   ALT 20 12/06/2016 1029   BILITOT 0.3 01/05/2018 0845   BILITOT 0.32 12/06/2016 1029       No results found for: LABCA2  No components found for: LABCA125  No results for input(s): INR in the last 168 hours.  Urinalysis    Component Value Date/Time   LABSPEC 1.005 10/14/2015 0933   PHURINE 6.0 10/14/2015 0933   GLUCOSEU Negative 10/14/2015 0933   HGBUR Negative 10/14/2015 0933   BILIRUBINUR Negative 10/14/2015 0933   KETONESUR Negative 10/14/2015 0933   PROTEINUR Negative 10/14/2015 0933   UROBILINOGEN 0.2 10/14/2015 0933   NITRITE Negative 10/14/2015 0933   LEUKOCYTESUR Negative 10/14/2015 0933    STUDIES: She underwent a diagnostic bilateral breast mammogram with TOMO, 07/22/2017, breast density category C, showing no evidence of  malignancy in either breast, status post right lumpectomy.  ASSESSMENT: 58 y.o. status post right breast upper outer quadrant biopsy 08/19/2015 for a clinical T1b N0, stage IA invasive ductal carcinoma, estrogen and progesterone receptor positive, HER-2 not amplified, with an MIB-1 of 5%  (1) status post right lumpectomy and sentinel lymph node sampling 09/01/2015 for a pT1c pN0, stage IA invasive ductal carcinoma, grade 2, with negative margins; repeat HER-2 again negative  (2) Oncotype DX score of 23 falls in the intermediate range, and predicts a risk of recurrence outside the breast within 10 years of 15%, if the patient's only systemic therapy is tamoxifen for 5 years. It also predicts an absolute risk reduction of 4% with added chemotherapy (to a final 11% risk)  (3) cyclophosphamide and docetaxel x 4, given every 21 days with neulasta onpro started 10/06/15, completed 12/16/2015  (4) adjuvant radiation  01/26/2016 to 03/12/2016 1. The Right breast was treated to 50.4 Gy in 28 fractions at 1.8 Gy per fraction.  2. The Right breast was boosted to 10 Gy in 5 fractions at 2 Gy per fraction.  (5) tamoxifen started 03/23/2016  (a) bone density   (6) genetics testing 04/16/2016 through the Custom gene panel offered by [facility]  found no deleterious mutations in ATM, BARD1, BRCA1, BRCA2, BRIP1, CDH1, CHEK2, EPCAM, FANCC, MLH1, MSH2, MSH6, MUTYH, NBN, PALB2, PMS2, POLD1, PTEN, RAD51C, RAD51D, TP53, and XRCC2.     PLAN: Tracy Hart is now a little over 3 years out from definitive surgery for her breast cancer with no evidence of disease  recurrence.  This is very favorable.  She is tolerating tamoxifen well and the plan will be to continue that a minimum of 5 years.  She is doing a great job of losing weight and becoming more fit.  She has had a "plateau".  She wants to lose more weight she will have to either diet more strictly or increase her exercise or develop  She will have mammography the first week  in December and she will see Dr. Ninfa Linden shortly after that.  She will return to see me in 1 year  She knows to call for any other issues that may develop before that visit.   Magrinat, Virgie Dad, MD  01/04/19 1:57 PM Medical Oncology and Hematology [facility] [address] [place],  [address] Tel. [phone]    Fax. [phone]   I, Wilburn Mylar, am acting as scribe for Dr. Virgie Dad. Magrinat.  I, Lurline Del MD, have reviewed the above documentation for accuracy and completeness, and I agree with the above.

## 2019-01-04 ENCOUNTER — Other Ambulatory Visit: Payer: BLUE CROSS/BLUE SHIELD

## 2019-01-04 ENCOUNTER — Inpatient Hospital Stay: Payer: BLUE CROSS/BLUE SHIELD | Attending: Oncology | Admitting: Oncology

## 2019-01-04 VITALS — BP 112/74 | HR 76 | Wt 202.8 lb

## 2019-01-04 DIAGNOSIS — I10 Essential (primary) hypertension: Secondary | ICD-10-CM | POA: Diagnosis not present

## 2019-01-04 DIAGNOSIS — Z170 Estrogen receptor positive status [ER+]: Secondary | ICD-10-CM | POA: Diagnosis not present

## 2019-01-04 DIAGNOSIS — Z79810 Long term (current) use of selective estrogen receptor modulators (SERMs): Secondary | ICD-10-CM

## 2019-01-04 DIAGNOSIS — E039 Hypothyroidism, unspecified: Secondary | ICD-10-CM

## 2019-01-04 DIAGNOSIS — Z79899 Other long term (current) drug therapy: Secondary | ICD-10-CM

## 2019-01-04 DIAGNOSIS — Z923 Personal history of irradiation: Secondary | ICD-10-CM

## 2019-01-04 DIAGNOSIS — C50411 Malignant neoplasm of upper-outer quadrant of right female breast: Secondary | ICD-10-CM | POA: Diagnosis not present

## 2019-01-04 DIAGNOSIS — Z7982 Long term (current) use of aspirin: Secondary | ICD-10-CM

## 2019-01-04 DIAGNOSIS — F17210 Nicotine dependence, cigarettes, uncomplicated: Secondary | ICD-10-CM

## 2019-01-04 DIAGNOSIS — Z9221 Personal history of antineoplastic chemotherapy: Secondary | ICD-10-CM

## 2019-01-09 DIAGNOSIS — C50919 Malignant neoplasm of unspecified site of unspecified female breast: Secondary | ICD-10-CM | POA: Diagnosis not present

## 2019-01-09 DIAGNOSIS — I10 Essential (primary) hypertension: Secondary | ICD-10-CM | POA: Diagnosis not present

## 2019-01-09 DIAGNOSIS — H05011 Cellulitis of right orbit: Secondary | ICD-10-CM | POA: Diagnosis not present

## 2019-01-09 DIAGNOSIS — R7301 Impaired fasting glucose: Secondary | ICD-10-CM | POA: Diagnosis not present

## 2019-02-16 ENCOUNTER — Other Ambulatory Visit: Payer: Self-pay | Admitting: Oncology

## 2019-04-03 ENCOUNTER — Other Ambulatory Visit: Payer: Self-pay | Admitting: Oncology

## 2019-05-15 ENCOUNTER — Other Ambulatory Visit: Payer: Self-pay | Admitting: Oncology

## 2019-06-29 ENCOUNTER — Other Ambulatory Visit: Payer: Self-pay | Admitting: Oncology

## 2019-06-29 ENCOUNTER — Other Ambulatory Visit: Payer: Self-pay | Admitting: Surgery

## 2019-06-29 DIAGNOSIS — Z853 Personal history of malignant neoplasm of breast: Secondary | ICD-10-CM

## 2019-07-25 DIAGNOSIS — E038 Other specified hypothyroidism: Secondary | ICD-10-CM | POA: Diagnosis not present

## 2019-07-25 DIAGNOSIS — I10 Essential (primary) hypertension: Secondary | ICD-10-CM | POA: Diagnosis not present

## 2019-07-25 DIAGNOSIS — R7301 Impaired fasting glucose: Secondary | ICD-10-CM | POA: Diagnosis not present

## 2019-07-25 DIAGNOSIS — M859 Disorder of bone density and structure, unspecified: Secondary | ICD-10-CM | POA: Diagnosis not present

## 2019-07-27 ENCOUNTER — Other Ambulatory Visit: Payer: Self-pay

## 2019-07-27 ENCOUNTER — Ambulatory Visit
Admission: RE | Admit: 2019-07-27 | Discharge: 2019-07-27 | Disposition: A | Discharge status: Home / Self Care | Payer: BLUE CROSS/BLUE SHIELD | Source: Ambulatory Visit | Attending: Surgery | Admitting: Surgery

## 2019-07-27 ENCOUNTER — Other Ambulatory Visit: Payer: Self-pay | Admitting: Surgery

## 2019-07-27 DIAGNOSIS — Z853 Personal history of malignant neoplasm of breast: Secondary | ICD-10-CM

## 2019-07-27 DIAGNOSIS — R921 Mammographic calcification found on diagnostic imaging of breast: Secondary | ICD-10-CM | POA: Diagnosis not present

## 2019-07-30 ENCOUNTER — Ambulatory Visit
Admission: RE | Admit: 2019-07-30 | Discharge: 2019-07-30 | Disposition: A | Discharge status: Home / Self Care | Payer: BC Managed Care – PPO | Source: Ambulatory Visit | Attending: Surgery | Admitting: Surgery

## 2019-07-30 ENCOUNTER — Other Ambulatory Visit: Payer: Self-pay

## 2019-07-30 DIAGNOSIS — R921 Mammographic calcification found on diagnostic imaging of breast: Secondary | ICD-10-CM

## 2019-07-30 DIAGNOSIS — N6489 Other specified disorders of breast: Secondary | ICD-10-CM | POA: Diagnosis not present

## 2019-07-31 DIAGNOSIS — C50911 Malignant neoplasm of unspecified site of right female breast: Secondary | ICD-10-CM | POA: Diagnosis not present

## 2019-08-02 DIAGNOSIS — E785 Hyperlipidemia, unspecified: Secondary | ICD-10-CM | POA: Diagnosis not present

## 2019-08-02 DIAGNOSIS — I10 Essential (primary) hypertension: Secondary | ICD-10-CM | POA: Diagnosis not present

## 2019-08-02 DIAGNOSIS — E039 Hypothyroidism, unspecified: Secondary | ICD-10-CM | POA: Diagnosis not present

## 2019-08-02 DIAGNOSIS — R7301 Impaired fasting glucose: Secondary | ICD-10-CM | POA: Diagnosis not present

## 2019-08-02 DIAGNOSIS — Z1331 Encounter for screening for depression: Secondary | ICD-10-CM | POA: Diagnosis not present

## 2019-08-02 DIAGNOSIS — Z0000 Encounter for general adult medical examination without abnormal findings: Secondary | ICD-10-CM | POA: Diagnosis not present

## 2019-08-06 DIAGNOSIS — Z6833 Body mass index (BMI) 33.0-33.9, adult: Secondary | ICD-10-CM | POA: Diagnosis not present

## 2019-08-06 DIAGNOSIS — Z01419 Encounter for gynecological examination (general) (routine) without abnormal findings: Secondary | ICD-10-CM | POA: Diagnosis not present

## 2019-08-10 ENCOUNTER — Other Ambulatory Visit: Payer: Self-pay | Admitting: Oncology

## 2019-09-27 ENCOUNTER — Other Ambulatory Visit: Payer: Self-pay | Admitting: Oncology

## 2019-10-22 ENCOUNTER — Other Ambulatory Visit: Payer: Self-pay | Admitting: Oncology

## 2019-12-03 ENCOUNTER — Other Ambulatory Visit: Payer: Self-pay | Admitting: Oncology

## 2019-12-21 ENCOUNTER — Other Ambulatory Visit: Payer: Self-pay | Admitting: Oncology

## 2020-01-06 NOTE — Progress Notes (Signed)
[facility]  [phone] [phone]   ID: Tracy Hart DOB: 1960-11-22  MR#: [id number]  NGE#:[id number]  Patient Care Team: Prince Solian, MD as PCP - General (Internal Medicine) Maisie Fus, MD as Consulting Physician (Obstetrics and Gynecology) Rogue Pautler, Virgie Dad, MD as Consulting Physician (Oncology) [name], MD as Consulting Physician (General Surgery) [name], MD as Consulting Physician (Radiation Oncology) OTHER MD:  CHIEF COMPLAINT: Estrogen receptor positive breast cancer  CURRENT TREATMENT: Tamoxifen   INTERVAL HISTORY: Tracy Hart returns today for follow-up of her estrogen receptor positive breast cancer.   She continues on tamoxifen.  She still has hot flashes or even more a feeling of being warm.  Gabapentin does help.  They did just come back from a trip to [place] and of course there was a little bit more of an issue there since it was warmer and more humid  Since her last visit, she underwent bilateral diagnostic mammography with tomography at [facility] on 07/27/2019 showing: breast density category C; indeterminate 1.1 cm calcifications in the upper-outer left breast.  She proceeded to biopsy of the left breast calcifications on 07/30/2019. Pathology from the procedure ([id number]) showed: fibroadenomatoid change with calcifications.   REVIEW OF SYSTEMS: Tracy Hart walks or tries to walk about 5 days a week.  She had managed to lose quite a bit of weight prior to the Covid problem but then gained some of it back.  She is mostly working from home right now and is very productive at it.  Agustina Caroli her employer however is asking everyone to get back into the office by next month.  Aside from these issues the detailed review of systems was unremarkable.   BREAST CANCER HISTORY: From the original intake note:  "Tracy Hart" had routine screening mammography at Dr. [name] office late November, showing a possible mass in  the right breast. On 08/04/2015 she underwent right diagnostic mammography with tomosynthesis and right breast ultrasonography at the [facility] Center. The breast density was category C. In the upper outer quadrant of the right breast there was a 0.8 cm area of asymmetry which was not palpable by exam. Ultrasound showed no suspicious mass or calcifications.  Biopsy of the area of asymmetry was performed 08/19/2015, and showed ([id number]) invasive and in situ ductal carcinoma, E-cadherin positive, estrogen receptor 90% positive, progesterone receptor 90% positive, both with strong staining intensity, with an MIB-1 of 5%, and no HER-2 amplification, the signals ratio being 1.44 and the number per cell 1.80.  The patient was then referred to surgery and after appropriate discussion underwent right lumpectomy and sentinel lymph node sampling 09/01/2015. The pathology from this procedure ([id number]) confirmed an invasive ductal carcinoma, grade 2, measuring 1.7 cm, with some low-grade ductal carcinoma in situ. Margins were negative. All 5 sentinel lymph nodes were clear.  The patient's subsequent history is as detailed below.   PAST MEDICAL HISTORY: Past Medical History:  Diagnosis Date  . Anxiety    takes Xanax daily as needed  . Breast cancer ([facility]) 08/19/15   right breast  . Cancer ([facility]) 2016   right breast  . Depression    takes Lexapro daily  . Family history of breast cancer   . Family history of uterine cancer   . GERD (gastroesophageal reflux disease)   . History of bronchitis 2000  . History of hiatal hernia   . Hyperlipidemia    takes Atorvastatin daily  . Hypertension    takes Benicar daily  .  Hypothyroidism    takes Synthroid daily  . Insomnia    takes Trazodone nightly  . Personal history of chemotherapy   . Personal history of radiation therapy   . Seasonal allergies    takes Zyrtec and uses Flonase daily    PAST SURGICAL HISTORY: Past Surgical History:  Procedure  Laterality Date  . BREAST BIOPSY    . BREAST LUMPECTOMY Right 2017  . BREAST LUMPECTOMY WITH RADIOACTIVE SEED AND SENTINEL LYMPH NODE BIOPSY Right 09/01/2015   Procedure: RIGHT BREAST LUMPECTOMY WITH RADIOACTIVE SEED LOCALIZED AND SENTINEL LYMPH NODE BIOPSY;  Surgeon: [name], MD;  Location: [facility];  Service: General;  Laterality: Right;  . COLONOSCOPY    . ESOPHAGOGASTRODUODENOSCOPY    . HERNIA REPAIR Right 2009  . PORT-A-CATH REMOVAL Left 03/29/2016   Procedure: REMOVAL PORT-A-CATH;  Surgeon: [name], MD;  Location: [facility];  Service: General;  Laterality: Left;  . PORTACATH PLACEMENT Left 09/29/2015   Procedure: INSERTION PORT-A-CATH;  Surgeon: [name], MD;  Location: [facility];  Service: General;  Laterality: Left;  . TONSILLECTOMY  at age 36    FAMILY HISTORY Family History  Problem Relation Age of Onset  . Cancer Mother 48       breast  . Diabetes Mother   . Hypertension Mother   . Breast cancer Mother   . Hypertension Father   . Cancer Sister 2       thyroid  . Seizures Sister   . Uterine cancer Maternal Grandmother 71  . Vaginal cancer Maternal Grandmother        dx in her 32s  . Heart disease Paternal Grandfather   . Breast cancer Sister 61       DCIS; negative genetic testing  . Colon cancer Paternal Aunt 82  . Breast cancer Cousin 43       paternal first cousin   The patient's parents are in their early 5s as of January 2017. The patient's mother was 44 when she died in 11-11-2017. The patient had no brothers, 3 sisters. The patient's mother was diagnosed with breast cancer at age 29. The patient's sister was diagnosed with thyroid cancer at age 90. There is no history of ovarian cancer in the family.    GYNECOLOGIC HISTORY:  No LMP recorded. Patient is postmenopausal. Menarche age 69, the patient is GX P0. She was on oral contraceptives between 1996 and 2016, and at the time of her diagnosis had been on  hormone replacement for 2 months.    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 Her husband Tracy Hart") works in Occupational hygienist for a Technical sales engineer. At home is just the 2 of them, with no pets. The patient at tends [place]    ADVANCED DIRECTIVES: In place. The patient's husband is her healthcare power of attorney with her sister [name] listed as secondary   HEALTH MAINTENANCE: Social History   Tobacco Use  . Smoking status: Former Smoker    Packs/day: 0.50    Types: Cigarettes    Quit date: 01/29/1989    Years since quitting: 30.9  . Smokeless tobacco: Never Used  . Tobacco comment: quit smoking around 1990  Substance Use Topics  . Alcohol use: No  . Drug use: No     Colonoscopy: 2010/[facility]  PAP: 07/10/2018  Bone density: Due/ at Dr [name]  Lipid panel:  Allergies  Allergen Reactions  .  Codeine     No percocet, makes patient weird  . Sulfa Antibiotics Other (See Comments)    Caused mouth sores    Current Outpatient Medications  Medication Sig Dispense Refill  . aspirin 81 MG tablet Take 81 mg by mouth daily. Reported on 02/20/2016    . atorvastatin (LIPITOR) 40 MG tablet Take 40 mg by mouth daily.    . cetirizine (ZYRTEC) 10 MG tablet Take 10 mg by mouth daily.    . cholecalciferol (VITAMIN D) 400 UNITS TABS tablet Take 800 Units by mouth 2 (two) times daily.    Marland Kitchen escitalopram (LEXAPRO) 10 MG tablet Take 10 mg by mouth daily.    . fluticasone (FLONASE) 50 MCG/ACT nasal spray Place 1 spray into both nostrils daily. Reported on 11/25/2015    . gabapentin (NEURONTIN) 300 MG capsule Take 1 capsule (300 mg total) by mouth at bedtime. 90 capsule 4  . levothyroxine (SYNTHROID, LEVOTHROID) 137 MCG tablet Take 137 mcg by mouth daily before breakfast.    . Multiple Vitamin (MULTIVITAMIN WITH MINERALS) TABS tablet Take 1 tablet by mouth daily.    Marland Kitchen olmesartan-hydrochlorothiazide (BENICAR HCT) 40-25 MG per tablet  Take 1 tablet by mouth daily. Reported on 02/13/2016    . Omega-3 Fatty Acids (FISH OIL) 1200 MG CAPS Take by mouth 2 (two) times daily. Reported on 02/13/2016    . tamoxifen (NOLVADEX) 20 MG tablet Take 1 tablet (20 mg total) by mouth daily. 90 tablet 4  . traZODone (DESYREL) 50 MG tablet Take 50 mg by mouth at bedtime. Reported on 12/02/2015     No current facility-administered medications for this visit.    OBJECTIVE: white woman in no acute distress  Vitals:   01/07/20 1350  BP: (!) 141/80  Pulse: 87  Resp: 18  Temp: 98.3 F (36.8 C)  SpO2: 98%     Body mass index is 33.82 kg/m.    ECOG FS:0 - Asymptomatic  Sclerae unicteric, EOMs intact Wearing a mask No cervical or supraclavicular adenopathy Lungs no rales or rhonchi Heart regular rate and rhythm Abd soft, nontender, positive bowel sounds MSK no focal spinal tenderness, no upper extremity lymphedema Neuro: nonfocal, well oriented, appropriate affect Breasts: The right breast is status post lumpectomy and radiation.  I do not palpate any suspicious findings.  The left breast is benign.  Both axillae are benign.   LAB RESULTS:  CMP     Component Value Date/Time   NA 136 01/05/2018 0845   NA 139 12/06/2016 1029   K 4.1 01/05/2018 0845   K 4.6 12/06/2016 1029   CL 102 01/05/2018 0845   CO2 25 01/05/2018 0845   CO2 27 12/06/2016 1029   GLUCOSE 139 01/05/2018 0845   GLUCOSE 97 12/06/2016 1029   BUN 16 01/05/2018 0845   BUN 12.7 12/06/2016 1029   CREATININE 0.83 01/05/2018 0845   CREATININE 0.8 12/06/2016 1029   CALCIUM 9.2 01/05/2018 0845   CALCIUM 9.8 12/06/2016 1029   PROT 7.7 01/05/2018 0845   PROT 7.5 12/06/2016 1029   ALBUMIN 4.1 01/05/2018 0845   ALBUMIN 4.3 12/06/2016 1029   AST 26 01/05/2018 0845   AST 20 12/06/2016 1029   ALT 27 01/05/2018 0845   ALT 20 12/06/2016 1029   ALKPHOS 71 01/05/2018 0845   ALKPHOS 71 12/06/2016 1029   BILITOT 0.3 01/05/2018 0845   BILITOT 0.32 12/06/2016 1029   GFRNONAA  >60 01/05/2018 0845   GFRAA >60 01/05/2018 0845    INo results found  for: SPEP, UPEP  Lab Results  Component Value Date   WBC 6.6 01/07/2020   NEUTROABS 3.9 01/07/2020   HGB 12.9 01/07/2020   HCT 36.4 01/07/2020   MCV 99.2 01/07/2020   PLT 182 01/07/2020      Chemistry      Component Value Date/Time   NA 136 01/05/2018 0845   NA 139 12/06/2016 1029   K 4.1 01/05/2018 0845   K 4.6 12/06/2016 1029   CL 102 01/05/2018 0845   CO2 25 01/05/2018 0845   CO2 27 12/06/2016 1029   BUN 16 01/05/2018 0845   BUN 12.7 12/06/2016 1029   CREATININE 0.83 01/05/2018 0845   CREATININE 0.8 12/06/2016 1029      Component Value Date/Time   CALCIUM 9.2 01/05/2018 0845   CALCIUM 9.8 12/06/2016 1029   ALKPHOS 71 01/05/2018 0845   ALKPHOS 71 12/06/2016 1029   AST 26 01/05/2018 0845   AST 20 12/06/2016 1029   ALT 27 01/05/2018 0845   ALT 20 12/06/2016 1029   BILITOT 0.3 01/05/2018 0845   BILITOT 0.32 12/06/2016 1029       No results found for: LABCA2  No components found for: LABCA125  No results for input(s): INR in the last 168 hours.  Urinalysis    Component Value Date/Time   LABSPEC 1.005 10/14/2015 0933   PHURINE 6.0 10/14/2015 0933   GLUCOSEU Negative 10/14/2015 0933   HGBUR Negative 10/14/2015 0933   BILIRUBINUR Negative 10/14/2015 0933   KETONESUR Negative 10/14/2015 0933   PROTEINUR Negative 10/14/2015 0933   UROBILINOGEN 0.2 10/14/2015 0933   NITRITE Negative 10/14/2015 0933   LEUKOCYTESUR Negative 10/14/2015 0933    STUDIES: No results found.   ASSESSMENT: 59 y.o. status post right breast upper outer quadrant biopsy 08/19/2015 for a clinical T1b N0, stage IA invasive ductal carcinoma, estrogen and progesterone receptor positive, HER-2 not amplified, with an MIB-1 of 5%  (1) status post right lumpectomy and sentinel lymph node sampling 09/01/2015 for a pT1c pN0, stage IA invasive ductal carcinoma, grade 2, with negative margins; repeat HER-2 again  negative  (2) Oncotype DX score of 23 falls in the intermediate range, and predicts a risk of recurrence outside the breast within 10 years of 15%, if the patient's only systemic therapy is tamoxifen for 5 years. It also predicts an absolute risk reduction of 4% with added chemotherapy (to a final 11% risk)  (3) cyclophosphamide and docetaxel x 4, given every 21 days with neulasta onpro started 10/06/15, completed 12/16/2015  (4) adjuvant radiation  01/26/2016 to 03/12/2016 1. The Right breast was treated to 50.4 Gy in 28 fractions at 1.8 Gy per fraction.  2. The Right breast was boosted to 10 Gy in 5 fractions at 2 Gy per fraction.  (5) tamoxifen started 03/23/2016  (a) bone density   (6) genetics testing 04/16/2016 through the Custom gene panel offered by [facility]  found no deleterious mutations in ATM, BARD1, BRCA1, BRCA2, BRIP1, CDH1, CHEK2, EPCAM, FANCC, MLH1, MSH2, MSH6, MUTYH, NBN, PALB2, PMS2, POLD1, PTEN, RAD51C, RAD51D, TP53, and XRCC2.      PLAN: Tracy Hart is now 4-1/2 years out from definitive surgery for her breast cancer with no evidence of disease recurrence.  This is very favorable.  She is tolerating tamoxifen well and the plan is to continue that through July of next year.  Went back he tells me that she does not even think about her cancer anymore I really think she has achieved what she needed to  achieve in terms of dealing with this problem  Her next visit will be her "graduation" visit  She knows to call for any other issue that may develop before then  Total encounter time 20 minutes.*  Chrishonda Hesch, Virgie Dad, MD  01/07/20 2:12 PM Medical Oncology and Hematology [facility] [place], [place] [address] Tel. [phone]    Fax. [phone]   I, Wilburn Mylar, am acting as scribe for Dr. Virgie Dad. Zeferino Mounts.  I, Lurline Del MD, have reviewed the above documentation for accuracy and completeness, and I agree with the above.   *Total  Encounter Time as defined by the Centers for Medicare and Medicaid Services includes, in addition to the face-to-face time of a patient visit (documented in the note above) non-face-to-face time: obtaining and reviewing outside history, ordering and reviewing medications, tests or procedures, care coordination (communications with other health care professionals or caregivers) and documentation in the medical record.

## 2020-01-07 ENCOUNTER — Other Ambulatory Visit: Payer: Self-pay

## 2020-01-07 ENCOUNTER — Inpatient Hospital Stay: Payer: BC Managed Care – PPO | Admitting: Oncology

## 2020-01-07 ENCOUNTER — Inpatient Hospital Stay: Payer: BC Managed Care – PPO | Attending: Oncology

## 2020-01-07 VITALS — BP 141/80 | HR 87 | Temp 98.3°F | Resp 18 | Ht 68.0 in | Wt 222.4 lb

## 2020-01-07 DIAGNOSIS — F418 Other specified anxiety disorders: Secondary | ICD-10-CM | POA: Diagnosis not present

## 2020-01-07 DIAGNOSIS — C50411 Malignant neoplasm of upper-outer quadrant of right female breast: Secondary | ICD-10-CM

## 2020-01-07 DIAGNOSIS — Z170 Estrogen receptor positive status [ER+]: Secondary | ICD-10-CM | POA: Diagnosis not present

## 2020-01-07 DIAGNOSIS — Z79899 Other long term (current) drug therapy: Secondary | ICD-10-CM | POA: Insufficient documentation

## 2020-01-07 DIAGNOSIS — Z803 Family history of malignant neoplasm of breast: Secondary | ICD-10-CM | POA: Diagnosis not present

## 2020-01-07 DIAGNOSIS — I10 Essential (primary) hypertension: Secondary | ICD-10-CM | POA: Diagnosis not present

## 2020-01-07 DIAGNOSIS — E039 Hypothyroidism, unspecified: Secondary | ICD-10-CM | POA: Diagnosis not present

## 2020-01-07 DIAGNOSIS — R232 Flushing: Secondary | ICD-10-CM | POA: Diagnosis not present

## 2020-01-07 DIAGNOSIS — Z8349 Family history of other endocrine, nutritional and metabolic diseases: Secondary | ICD-10-CM | POA: Diagnosis not present

## 2020-01-07 DIAGNOSIS — E785 Hyperlipidemia, unspecified: Secondary | ICD-10-CM | POA: Diagnosis not present

## 2020-01-07 DIAGNOSIS — Z79810 Long term (current) use of selective estrogen receptor modulators (SERMs): Secondary | ICD-10-CM | POA: Diagnosis not present

## 2020-01-07 DIAGNOSIS — Z87891 Personal history of nicotine dependence: Secondary | ICD-10-CM | POA: Diagnosis not present

## 2020-01-07 LAB — COMPREHENSIVE METABOLIC PANEL
ALT: 22 U/L (ref 0–44)
AST: 21 U/L (ref 15–41)
Albumin: 4 g/dL (ref 3.5–5.0)
Alkaline Phosphatase: 76 U/L (ref 38–126)
Anion gap: 9 (ref 5–15)
BUN: 15 mg/dL (ref 6–20)
CO2: 28 mmol/L (ref 22–32)
Calcium: 9.2 mg/dL (ref 8.9–10.3)
Chloride: 101 mmol/L (ref 98–111)
Creatinine, Ser: 0.88 mg/dL (ref 0.44–1.00)
GFR calc Af Amer: >60 mL/min (ref >60)
GFR calc non Af Amer: >60 mL/min (ref >60)
Glucose, Bld: 105 mg/dL — ABNORMAL HIGH (ref 70–99)
Potassium: 4.1 mmol/L (ref 3.5–5.1)
Sodium: 138 mmol/L (ref 135–145)
Total Bilirubin: 0.3 mg/dL (ref 0.3–1.2)
Total Protein: 7.4 g/dL (ref 6.5–8.1)

## 2020-01-07 LAB — CBC WITH DIFFERENTIAL/PLATELET
Abs Immature Granulocytes: 0.01 10*3/uL (ref 0.00–0.07)
Basophils Absolute: 0 10*3/uL (ref 0.0–0.1)
Basophils Relative: 1 %
Eosinophils Absolute: 0.2 10*3/uL (ref 0.0–0.5)
Eosinophils Relative: 3 %
HCT: 36.4 % (ref 36.0–46.0)
Hemoglobin: 12.9 g/dL (ref 12.0–15.0)
Immature Granulocytes: 0 %
Lymphocytes Relative: 31 %
Lymphs Abs: 2 10*3/uL (ref 0.7–4.0)
MCH: 35.1 pg — ABNORMAL HIGH (ref 26.0–34.0)
MCHC: 35.4 g/dL (ref 30.0–36.0)
MCV: 99.2 fL (ref 80.0–100.0)
Monocytes Absolute: 0.5 10*3/uL (ref 0.1–1.0)
Monocytes Relative: 7 %
Neutro Abs: 3.9 10*3/uL (ref 1.7–7.7)
Neutrophils Relative %: 58 %
Platelets: 182 10*3/uL (ref 150–400)
RBC: 3.67 MIL/uL — ABNORMAL LOW (ref 3.87–5.11)
RDW: 11.9 % (ref 11.5–15.5)
WBC: 6.6 10*3/uL (ref 4.0–10.5)
nRBC: 0 % (ref 0.0–0.2)

## 2020-01-07 MED ORDER — GABAPENTIN 300 MG PO CAPS: 300.0000 mg | ORAL_CAPSULE | Freq: Every day | ORAL | 4 refills | Status: DC

## 2020-01-07 MED ORDER — TAMOXIFEN CITRATE 20 MG PO TABS: 20.0000 mg | ORAL_TABLET | Freq: Every day | ORAL | 4 refills | Status: DC

## 2020-01-08 ENCOUNTER — Telehealth: Payer: Self-pay | Admitting: Oncology

## 2020-01-08 NOTE — Telephone Encounter (Signed)
Scheduled appts per 5/17 los. Pt confirmed appt date and time.

## 2020-01-22 DIAGNOSIS — C50919 Malignant neoplasm of unspecified site of unspecified female breast: Secondary | ICD-10-CM | POA: Diagnosis not present

## 2020-01-22 DIAGNOSIS — R7301 Impaired fasting glucose: Secondary | ICD-10-CM | POA: Diagnosis not present

## 2020-01-22 DIAGNOSIS — K219 Gastro-esophageal reflux disease without esophagitis: Secondary | ICD-10-CM | POA: Diagnosis not present

## 2020-01-22 DIAGNOSIS — I10 Essential (primary) hypertension: Secondary | ICD-10-CM | POA: Diagnosis not present

## 2020-03-04 DIAGNOSIS — I10 Essential (primary) hypertension: Secondary | ICD-10-CM | POA: Diagnosis not present

## 2020-03-04 DIAGNOSIS — E669 Obesity, unspecified: Secondary | ICD-10-CM | POA: Diagnosis not present

## 2020-03-04 DIAGNOSIS — R7301 Impaired fasting glucose: Secondary | ICD-10-CM | POA: Diagnosis not present

## 2020-03-24 DIAGNOSIS — I10 Essential (primary) hypertension: Secondary | ICD-10-CM | POA: Diagnosis not present

## 2020-05-07 DIAGNOSIS — I10 Essential (primary) hypertension: Secondary | ICD-10-CM | POA: Diagnosis not present

## 2020-06-03 ENCOUNTER — Other Ambulatory Visit: Payer: Self-pay | Admitting: Oncology

## 2020-06-03 DIAGNOSIS — Z9889 Other specified postprocedural states: Secondary | ICD-10-CM

## 2020-07-28 ENCOUNTER — Ambulatory Visit
Admission: RE | Admit: 2020-07-28 | Discharge: 2020-07-28 | Disposition: A | Discharge status: Home / Self Care | Payer: BC Managed Care – PPO | Source: Ambulatory Visit | Attending: Oncology | Admitting: Oncology

## 2020-07-28 ENCOUNTER — Other Ambulatory Visit: Payer: Self-pay

## 2020-07-28 DIAGNOSIS — R922 Inconclusive mammogram: Secondary | ICD-10-CM | POA: Diagnosis not present

## 2020-07-28 DIAGNOSIS — Z9889 Other specified postprocedural states: Secondary | ICD-10-CM

## 2020-08-04 DIAGNOSIS — C50911 Malignant neoplasm of unspecified site of right female breast: Secondary | ICD-10-CM | POA: Diagnosis not present

## 2020-08-07 DIAGNOSIS — Z01419 Encounter for gynecological examination (general) (routine) without abnormal findings: Secondary | ICD-10-CM | POA: Diagnosis not present

## 2020-08-07 DIAGNOSIS — Z6832 Body mass index (BMI) 32.0-32.9, adult: Secondary | ICD-10-CM | POA: Diagnosis not present

## 2020-08-25 DIAGNOSIS — E785 Hyperlipidemia, unspecified: Secondary | ICD-10-CM | POA: Diagnosis not present

## 2020-08-25 DIAGNOSIS — R7301 Impaired fasting glucose: Secondary | ICD-10-CM | POA: Diagnosis not present

## 2020-08-25 DIAGNOSIS — E039 Hypothyroidism, unspecified: Secondary | ICD-10-CM | POA: Diagnosis not present

## 2020-08-25 DIAGNOSIS — M859 Disorder of bone density and structure, unspecified: Secondary | ICD-10-CM | POA: Diagnosis not present

## 2020-08-26 DIAGNOSIS — R87618 Other abnormal cytological findings on specimens from cervix uteri: Secondary | ICD-10-CM | POA: Diagnosis not present

## 2020-09-02 DIAGNOSIS — Z0000 Encounter for general adult medical examination without abnormal findings: Secondary | ICD-10-CM | POA: Diagnosis not present

## 2020-09-02 DIAGNOSIS — R82998 Other abnormal findings in urine: Secondary | ICD-10-CM | POA: Diagnosis not present

## 2020-09-02 DIAGNOSIS — I10 Essential (primary) hypertension: Secondary | ICD-10-CM | POA: Diagnosis not present

## 2020-09-09 DIAGNOSIS — Z1212 Encounter for screening for malignant neoplasm of rectum: Secondary | ICD-10-CM | POA: Diagnosis not present

## 2020-09-29 DIAGNOSIS — N840 Polyp of corpus uteri: Secondary | ICD-10-CM | POA: Diagnosis not present

## 2020-10-02 DIAGNOSIS — N840 Polyp of corpus uteri: Secondary | ICD-10-CM | POA: Diagnosis not present

## 2020-11-11 DIAGNOSIS — Z01812 Encounter for preprocedural laboratory examination: Secondary | ICD-10-CM | POA: Diagnosis not present

## 2020-11-14 DIAGNOSIS — D123 Benign neoplasm of transverse colon: Secondary | ICD-10-CM | POA: Diagnosis not present

## 2020-11-14 DIAGNOSIS — Z1211 Encounter for screening for malignant neoplasm of colon: Secondary | ICD-10-CM | POA: Diagnosis not present

## 2021-01-14 ENCOUNTER — Other Ambulatory Visit: Payer: Self-pay | Admitting: Oncology

## 2021-02-04 NOTE — Progress Notes (Signed)
[place]  [phone] [phone]   ID: Tracy Hart DOB: [birth year]  MR#: [id number]  PJK#:[id number]  Patient Care Team: Prince Solian, MD as PCP - General (Internal Medicine) Maisie Fus, MD as Consulting Physician (Obstetrics and Gynecology) Brianna Bennett, Virgie Dad, MD as Consulting Physician (Oncology) [name], MD as Consulting Physician (General Surgery) [name], MD as Consulting Physician (Radiation Oncology) OTHER MD:  CHIEF COMPLAINT: Estrogen receptor positive breast cancer  CURRENT TREATMENT: Tamoxifen   INTERVAL HISTORY: Tracy Hart returns today for follow-up of her estrogen receptor positive breast cancer.   She continues on tamoxifen.  Hot flashes are minimal.  Gabapentin does help with that and also with her sleep pattern  Since her last visit, she underwent bilateral diagnostic mammography with tomography at [facility] on 07/28/2020 showing: breast density category C; no evidence of malignancy in either breast.   REVIEW OF SYSTEMS: Tracy Hart is not exercising as much as she would like.  She does have an elliptical at home which she and her husband are not using.  A detailed review of systems today was otherwise noncontributory   COVID 19 VACCINATION STATUS: Status post Pfizer x2 followed by 1 booster   BREAST CANCER HISTORY: From the original intake note:  "Tracy Hart" had routine screening mammography at Dr. [name] office late November, showing a possible mass in the right breast. On 08/04/2015 she underwent right diagnostic mammography with tomosynthesis and right breast ultrasonography at the [facility] Center. The breast density was category C. In the upper outer quadrant of the right breast there was a 0.8 cm area of asymmetry which was not palpable by exam. Ultrasound showed no suspicious mass or calcifications.  Biopsy of the area of asymmetry was performed 08/19/2015, and showed ([id number]) invasive and in  situ ductal carcinoma, E-cadherin positive, estrogen receptor 90% positive, progesterone receptor 90% positive, both with strong staining intensity, with an MIB-1 of 5%, and no HER-2 amplification, the signals ratio being 1.44 and the number per cell 1.80.  The patient was then referred to surgery and after appropriate discussion underwent right lumpectomy and sentinel lymph node sampling 09/01/2015. The pathology from this procedure ([id number]) confirmed an invasive ductal carcinoma, grade 2, measuring 1.7 cm, with some low-grade ductal carcinoma in situ. Margins were negative. All 5 sentinel lymph nodes were clear.  The patient's subsequent history is as detailed below.   PAST MEDICAL HISTORY: Past Medical History:  Diagnosis Date   Anxiety    takes Xanax daily as needed   Breast cancer ([place]) 08/19/15   right breast   Cancer ([place]) 2016   right breast   Depression    takes Lexapro daily   Family history of breast cancer    Family history of uterine cancer    GERD (gastroesophageal reflux disease)    History of bronchitis 2000   History of hiatal hernia    Hyperlipidemia    takes Atorvastatin daily   Hypertension    takes Benicar daily   Hypothyroidism    takes Synthroid daily   Insomnia    takes Trazodone nightly   Personal history of chemotherapy    Personal history of radiation therapy    Seasonal allergies    takes Zyrtec and uses Flonase daily    PAST SURGICAL HISTORY: Past Surgical History:  Procedure Laterality Date   BREAST BIOPSY     BREAST LUMPECTOMY Right 2017   BREAST LUMPECTOMY WITH RADIOACTIVE SEED AND SENTINEL LYMPH NODE BIOPSY Right 09/01/2015  Procedure: RIGHT BREAST LUMPECTOMY WITH RADIOACTIVE SEED LOCALIZED AND SENTINEL LYMPH NODE BIOPSY;  Surgeon: [name], MD;  Location: [place] [facility];  Service: General;  Laterality: Right;   COLONOSCOPY     ESOPHAGOGASTRODUODENOSCOPY     HERNIA REPAIR Right 2009   PORT-A-CATH REMOVAL Left  03/29/2016   Procedure: REMOVAL PORT-A-CATH;  Surgeon: [name], MD;  Location: [facility];  Service: General;  Laterality: Left;   PORTACATH PLACEMENT Left 09/29/2015   Procedure: INSERTION PORT-A-CATH;  Surgeon: [name], MD;  Location: [place];  Service: General;  Laterality: Left;   TONSILLECTOMY  at age 40    FAMILY HISTORY Family History  Problem Relation Age of Onset   Cancer Mother 51       breast   Diabetes Mother    Hypertension Mother    Breast cancer Mother    Hypertension Father    Cancer Sister 38       thyroid   Seizures Sister    Uterine cancer Maternal Grandmother 74   Vaginal cancer Maternal Grandmother        dx in her 52s   Heart disease Paternal Grandfather    Breast cancer Sister 77       DCIS; negative genetic testing   Colon cancer Paternal Aunt 71   Breast cancer Cousin 87       paternal first cousin  The patient's parents are in their early 37s as of January 2017. The patient's mother was 67 when she died in Oct 31, 2017. The patient had no brothers, 3 sisters. The patient's mother was diagnosed with breast cancer at age 6. The patient's sister was diagnosed with thyroid cancer at age 42. There is no history of ovarian cancer in the family.    GYNECOLOGIC HISTORY:  No LMP recorded. Patient is postmenopausal. Menarche age 67, the patient is GX P0. She was on oral contraceptives between 1996 and 2016, and at the time of her diagnosis had been on hormone replacement for 2 months.    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  She will be working permanently from home.Marland Kitchen Her husband [name]") works in Occupational hygienist for a Technical sales engineer. At home is just the 2 of them, with no pets. The patient at tends [place]    ADVANCED DIRECTIVES: In place. The patient's husband is her healthcare  power of attorney with her sister [name] listed as secondary   HEALTH MAINTENANCE: Social History   Tobacco Use   Smoking status: Former    Packs/day: 0.50    Pack years: 0.00    Types: Cigarettes    Quit date: 01/29/1989    Years since quitting: 32.0   Smokeless tobacco: Never   Tobacco comments:    quit smoking around 1990  Substance Use Topics   Alcohol use: No   Drug use: No     Colonoscopy: 2022  PAP: Up-to-date/[place]  Bone density:     Allergies  Allergen Reactions   Codeine     No percocet, makes patient weird   Sulfa Antibiotics Other (See Comments)    Caused mouth sores    Current Outpatient Medications  Medication Sig Dispense Refill   aspirin 81 MG tablet Take 81 mg by mouth daily. Reported on 02/20/2016     atorvastatin (LIPITOR) 40 MG tablet Take 40 mg  by mouth daily.     cetirizine (ZYRTEC) 10 MG tablet Take 10 mg by mouth daily.     cholecalciferol (VITAMIN D) 400 UNITS TABS tablet Take 800 Units by mouth 2 (two) times daily.     escitalopram (LEXAPRO) 10 MG tablet Take 10 mg by mouth daily.     fluticasone (FLONASE) 50 MCG/ACT nasal spray Place 1 spray into both nostrils daily. Reported on 11/25/2015     gabapentin (NEURONTIN) 300 MG capsule TAKE 1 CAPSULE(300 MG) BY MOUTH AT BEDTIME 90 capsule 4   levothyroxine (SYNTHROID, LEVOTHROID) 137 MCG tablet Take 137 mcg by mouth daily before breakfast.     Multiple Vitamin (MULTIVITAMIN WITH MINERALS) TABS tablet Take 1 tablet by mouth daily.     olmesartan-hydrochlorothiazide (BENICAR HCT) 40-25 MG per tablet Take 1 tablet by mouth daily. Reported on 02/13/2016     Omega-3 Fatty Acids (FISH OIL) 1200 MG CAPS Take by mouth 2 (two) times daily. Reported on 02/13/2016     traZODone (DESYREL) 50 MG tablet Take 50 mg by mouth at bedtime. Reported on 12/02/2015     No current facility-administered medications for this visit.    OBJECTIVE: white woman who appears younger than stated age  60:   02/05/21 1421   BP: 135/73  Pulse: 90  Resp: 18  Temp: 97.9 F (36.6 C)  SpO2: 98%     Body mass index is 33.41 kg/m.    ECOG FS:0 - Asymptomatic  Sclerae unicteric, EOMs intact Wearing a mask No cervical or supraclavicular adenopathy Lungs no rales or rhonchi Heart regular rate and rhythm Abd soft, nontender, positive bowel sounds MSK no focal spinal tenderness, no upper extremity lymphedema Neuro: nonfocal, well oriented, appropriate affect Breasts: The right breast has undergone lumpectomy followed by radiation.  There is no evidence of local recurrence.  Left breast and both axillae are benign.   LAB RESULTS:  CMP     Component Value Date/Time   NA 138 01/07/2020 1339   NA 139 12/06/2016 1029   K 4.1 01/07/2020 1339   K 4.6 12/06/2016 1029   CL 101 01/07/2020 1339   CO2 28 01/07/2020 1339   CO2 27 12/06/2016 1029   GLUCOSE 105 (H) 01/07/2020 1339   GLUCOSE 97 12/06/2016 1029   BUN 15 01/07/2020 1339   BUN 12.7 12/06/2016 1029   CREATININE 0.88 01/07/2020 1339   CREATININE 0.8 12/06/2016 1029   CALCIUM 9.2 01/07/2020 1339   CALCIUM 9.8 12/06/2016 1029   PROT 7.4 01/07/2020 1339   PROT 7.5 12/06/2016 1029   ALBUMIN 4.0 01/07/2020 1339   ALBUMIN 4.3 12/06/2016 1029   AST 21 01/07/2020 1339   AST 20 12/06/2016 1029   ALT 22 01/07/2020 1339   ALT 20 12/06/2016 1029   ALKPHOS 76 01/07/2020 1339   ALKPHOS 71 12/06/2016 1029   BILITOT 0.3 01/07/2020 1339   BILITOT 0.32 12/06/2016 1029   GFRNONAA >60 01/07/2020 1339   GFRAA >60 01/07/2020 1339    INo results found for: SPEP, UPEP  Lab Results  Component Value Date   WBC 12.0 (H) 02/05/2021   NEUTROABS 8.8 (H) 02/05/2021   HGB 13.1 02/05/2021   HCT 37.7 02/05/2021   MCV 97.9 02/05/2021   PLT 222 02/05/2021      Chemistry      Component Value Date/Time   NA 138 01/07/2020 1339   NA 139 12/06/2016 1029   K 4.1 01/07/2020 1339   K 4.6 12/06/2016 1029   CL 101  01/07/2020 1339   CO2 28 01/07/2020 1339   CO2 27  12/06/2016 1029   BUN 15 01/07/2020 1339   BUN 12.7 12/06/2016 1029   CREATININE 0.88 01/07/2020 1339   CREATININE 0.8 12/06/2016 1029      Component Value Date/Time   CALCIUM 9.2 01/07/2020 1339   CALCIUM 9.8 12/06/2016 1029   ALKPHOS 76 01/07/2020 1339   ALKPHOS 71 12/06/2016 1029   AST 21 01/07/2020 1339   AST 20 12/06/2016 1029   ALT 22 01/07/2020 1339   ALT 20 12/06/2016 1029   BILITOT 0.3 01/07/2020 1339   BILITOT 0.32 12/06/2016 1029       No results found for: LABCA2  No components found for: LABCA125  No results for input(s): INR in the last 168 hours.  Urinalysis    Component Value Date/Time   LABSPEC 1.005 10/14/2015 0933   PHURINE 6.0 10/14/2015 0933   GLUCOSEU Negative 10/14/2015 0933   HGBUR Negative 10/14/2015 0933   BILIRUBINUR Negative 10/14/2015 0933   KETONESUR Negative 10/14/2015 0933   PROTEINUR Negative 10/14/2015 0933   UROBILINOGEN 0.2 10/14/2015 0933   NITRITE Negative 10/14/2015 0933   LEUKOCYTESUR Negative 10/14/2015 0933    STUDIES: No results found.   ASSESSMENT: 60 y.o. status post right breast upper outer quadrant biopsy 08/19/2015 for a clinical T1b N0, stage IA invasive ductal carcinoma, estrogen and progesterone receptor positive, HER-2 not amplified, with an MIB-1 of 5%  (1) status post right lumpectomy and sentinel lymph node sampling 09/01/2015 for a pT1c pN0, stage IA invasive ductal carcinoma, grade 2, with negative margins; repeat HER-2 again negative  (2) Oncotype DX score of 23 falls in the intermediate range, and predicts a risk of recurrence outside the breast within 10 years of 15%, if the patient's only systemic therapy is tamoxifen for 5 years. It also predicts an absolute risk reduction of 4% with added chemotherapy (to a final 11% risk)  (3) cyclophosphamide and docetaxel x 4, given every 21 days with neulasta onpro started 10/06/15, completed 12/16/2015  (4) adjuvant radiation  01/26/2016 to 03/12/2016 1. The Right  breast was treated to 50.4 Gy in 28 fractions at 1.8 Gy per fraction.  2. The Right breast was boosted to 10 Gy in 5 fractions at 2 Gy per fraction.  (5) tamoxifen started 03/23/2016, completing 5 years June 2022  (6) genetics testing 04/16/2016 through the Custom gene panel offered by [facility]  found no deleterious mutations in ATM, BARD1, BRCA1, BRCA2, BRIP1, CDH1, CHEK2, EPCAM, FANCC, MLH1, MSH2, MSH6, MUTYH, NBN, PALB2, PMS2, POLD1, PTEN, RAD51C, RAD51D, TP53, and XRCC2.      PLAN: Tracy Hart is now 5-1/2 years out from definitive surgery for her breast cancer with no evidence of disease recurrence.  This is very favorable.  She is completing 5 years of tamoxifen.  She has tolerated it well.  I do not see any reason to continue tamoxifen in her case given her very good prognosis  Her ANC is slightly up today.  I asked her if she had any signs of sinus or urinary tract infections and she does not but she says this morning when she woke up she had a little bit of discomfort in her left lower quadrant.  She may be having a very mild case of diverticulitis, certainly with no fever, but if any symptoms develop she will call Dr. Dagmar Hait for the appropriate antibiotics  She requested I refill her gabapentin even though she is going off the tamoxifen and I  was glad to do that.  Further refills of course will be through her primary care physician.  At this point I feel comfortable releasing her to her primary care physicians care.  All she will need in terms of breast cancer follow-up is a yearly mammogram and a yearly physician breast exam.  I will be glad to see Tracy Hart again at any point in the future if and when the need arises but as of now are making no further routine appointments for  Total encounter time 25 minutes.*   Mizani Dilday, Virgie Dad, MD  02/05/21 2:29 PM Medical Oncology and Hematology [facility] [place], [place] [address] Tel. [phone]    Fax.  [phone]   I, Wilburn Mylar, am acting as scribe for Dr. Virgie Dad. Dionicia Cerritos.  I, Lurline Del MD, have reviewed the above documentation for accuracy and completeness, and I agree with the above.   *Total Encounter Time as defined by the Centers for Medicare and Medicaid Services includes, in addition to the face-to-face time of a patient visit (documented in the note above) non-face-to-face time: obtaining and reviewing outside history, ordering and reviewing medications, tests or procedures, care coordination (communications with other health care professionals or caregivers) and documentation in the medical record.

## 2021-02-05 ENCOUNTER — Other Ambulatory Visit: Payer: Self-pay

## 2021-02-05 ENCOUNTER — Inpatient Hospital Stay: Payer: BC Managed Care – PPO | Attending: Oncology | Admitting: Oncology

## 2021-02-05 ENCOUNTER — Inpatient Hospital Stay: Payer: BC Managed Care – PPO

## 2021-02-05 VITALS — BP 135/73 | HR 90 | Temp 97.9°F | Resp 18 | Ht 68.0 in | Wt 219.7 lb

## 2021-02-05 DIAGNOSIS — C50411 Malignant neoplasm of upper-outer quadrant of right female breast: Secondary | ICD-10-CM | POA: Diagnosis not present

## 2021-02-05 DIAGNOSIS — Z923 Personal history of irradiation: Secondary | ICD-10-CM | POA: Insufficient documentation

## 2021-02-05 DIAGNOSIS — Z79899 Other long term (current) drug therapy: Secondary | ICD-10-CM | POA: Diagnosis not present

## 2021-02-05 DIAGNOSIS — Z7982 Long term (current) use of aspirin: Secondary | ICD-10-CM | POA: Diagnosis not present

## 2021-02-05 DIAGNOSIS — Z170 Estrogen receptor positive status [ER+]: Secondary | ICD-10-CM | POA: Diagnosis not present

## 2021-02-05 DIAGNOSIS — Z79810 Long term (current) use of selective estrogen receptor modulators (SERMs): Secondary | ICD-10-CM | POA: Insufficient documentation

## 2021-02-05 DIAGNOSIS — I10 Essential (primary) hypertension: Secondary | ICD-10-CM | POA: Diagnosis not present

## 2021-02-05 DIAGNOSIS — Z9221 Personal history of antineoplastic chemotherapy: Secondary | ICD-10-CM | POA: Diagnosis not present

## 2021-02-05 DIAGNOSIS — E039 Hypothyroidism, unspecified: Secondary | ICD-10-CM | POA: Insufficient documentation

## 2021-02-05 DIAGNOSIS — Z87891 Personal history of nicotine dependence: Secondary | ICD-10-CM | POA: Insufficient documentation

## 2021-02-05 DIAGNOSIS — E785 Hyperlipidemia, unspecified: Secondary | ICD-10-CM | POA: Insufficient documentation

## 2021-02-05 DIAGNOSIS — G4700 Insomnia, unspecified: Secondary | ICD-10-CM | POA: Insufficient documentation

## 2021-02-05 LAB — COMPREHENSIVE METABOLIC PANEL
ALT: 18 U/L (ref 0–44)
AST: 20 U/L (ref 15–41)
Albumin: 4 g/dL (ref 3.5–5.0)
Alkaline Phosphatase: 69 U/L (ref 38–126)
Anion gap: 11 (ref 5–15)
BUN: 17 mg/dL (ref 8–23)
CO2: 25 mmol/L (ref 22–32)
Calcium: 9.3 mg/dL (ref 8.9–10.3)
Chloride: 99 mmol/L (ref 98–111)
Creatinine, Ser: 0.79 mg/dL (ref 0.44–1.00)
GFR, Estimated: >60 mL/min (ref >60)
Glucose, Bld: 94 mg/dL (ref 70–99)
Potassium: 3.7 mmol/L (ref 3.5–5.1)
Sodium: 135 mmol/L (ref 135–145)
Total Bilirubin: 0.3 mg/dL (ref 0.3–1.2)
Total Protein: 7.6 g/dL (ref 6.5–8.1)

## 2021-02-05 LAB — CBC WITH DIFFERENTIAL/PLATELET
Abs Immature Granulocytes: 0.03 10*3/uL (ref 0.00–0.07)
Basophils Absolute: 0 10*3/uL (ref 0.0–0.1)
Basophils Relative: 0 %
Eosinophils Absolute: 0.2 10*3/uL (ref 0.0–0.5)
Eosinophils Relative: 1 %
HCT: 37.7 % (ref 36.0–46.0)
Hemoglobin: 13.1 g/dL (ref 12.0–15.0)
Immature Granulocytes: 0 %
Lymphocytes Relative: 18 %
Lymphs Abs: 2.2 10*3/uL (ref 0.7–4.0)
MCH: 34 pg (ref 26.0–34.0)
MCHC: 34.7 g/dL (ref 30.0–36.0)
MCV: 97.9 fL (ref 80.0–100.0)
Monocytes Absolute: 0.8 10*3/uL (ref 0.1–1.0)
Monocytes Relative: 7 %
Neutro Abs: 8.8 10*3/uL — ABNORMAL HIGH (ref 1.7–7.7)
Neutrophils Relative %: 74 %
Platelets: 222 10*3/uL (ref 150–400)
RBC: 3.85 MIL/uL — ABNORMAL LOW (ref 3.87–5.11)
RDW: 12.1 % (ref 11.5–15.5)
WBC: 12 10*3/uL — ABNORMAL HIGH (ref 4.0–10.5)
nRBC: 0 % (ref 0.0–0.2)

## 2021-02-05 MED ORDER — GABAPENTIN 300 MG PO CAPS: ORAL_CAPSULE | ORAL | 4 refills | Status: AC

## 2021-03-03 DIAGNOSIS — F418 Other specified anxiety disorders: Secondary | ICD-10-CM | POA: Diagnosis not present

## 2021-03-03 DIAGNOSIS — I10 Essential (primary) hypertension: Secondary | ICD-10-CM | POA: Diagnosis not present

## 2021-03-04 DIAGNOSIS — R300 Dysuria: Secondary | ICD-10-CM | POA: Diagnosis not present

## 2021-04-24 DIAGNOSIS — L309 Dermatitis, unspecified: Secondary | ICD-10-CM | POA: Diagnosis not present

## 2021-07-06 ENCOUNTER — Other Ambulatory Visit: Payer: Self-pay | Admitting: Surgery

## 2021-07-06 DIAGNOSIS — Z853 Personal history of malignant neoplasm of breast: Secondary | ICD-10-CM

## 2021-08-03 ENCOUNTER — Ambulatory Visit
Admission: RE | Admit: 2021-08-03 | Discharge: 2021-08-03 | Disposition: A | Discharge status: Home / Self Care | Payer: BC Managed Care – PPO | Source: Ambulatory Visit | Attending: Surgery | Admitting: Surgery

## 2021-08-03 DIAGNOSIS — Z853 Personal history of malignant neoplasm of breast: Secondary | ICD-10-CM

## 2021-08-03 DIAGNOSIS — R922 Inconclusive mammogram: Secondary | ICD-10-CM | POA: Diagnosis not present

## 2021-08-04 DIAGNOSIS — C50011 Malignant neoplasm of nipple and areola, right female breast: Secondary | ICD-10-CM | POA: Diagnosis not present

## 2021-08-04 DIAGNOSIS — Z170 Estrogen receptor positive status [ER+]: Secondary | ICD-10-CM | POA: Diagnosis not present

## 2021-08-05 ENCOUNTER — Other Ambulatory Visit (HOSPITAL_COMMUNITY): Payer: Self-pay

## 2021-08-05 MED ORDER — OZEMPIC (2 MG/DOSE) 8 MG/3ML ~~LOC~~ SOPN
2.0000 mg | PEN_INJECTOR | SUBCUTANEOUS | 5 refills | Status: DC
  Filled 2021-08-05: qty 3, 28d supply, fill #0
  Filled 2021-08-31: qty 3, 28d supply, fill #1
  Filled 2021-09-28: qty 3, 28d supply, fill #2
  Filled 2021-10-26: qty 3, 28d supply, fill #3
  Filled 2021-11-23: qty 3, 28d supply, fill #4
  Filled 2021-12-22: qty 3, 28d supply, fill #5

## 2021-08-10 DIAGNOSIS — Z01419 Encounter for gynecological examination (general) (routine) without abnormal findings: Secondary | ICD-10-CM | POA: Diagnosis not present

## 2021-08-10 DIAGNOSIS — Z6833 Body mass index (BMI) 33.0-33.9, adult: Secondary | ICD-10-CM | POA: Diagnosis not present

## 2021-08-31 ENCOUNTER — Other Ambulatory Visit (HOSPITAL_COMMUNITY): Payer: Self-pay

## 2021-09-17 DIAGNOSIS — R7301 Impaired fasting glucose: Secondary | ICD-10-CM | POA: Diagnosis not present

## 2021-09-17 DIAGNOSIS — E785 Hyperlipidemia, unspecified: Secondary | ICD-10-CM | POA: Diagnosis not present

## 2021-09-17 DIAGNOSIS — E039 Hypothyroidism, unspecified: Secondary | ICD-10-CM | POA: Diagnosis not present

## 2021-09-17 DIAGNOSIS — M859 Disorder of bone density and structure, unspecified: Secondary | ICD-10-CM | POA: Diagnosis not present

## 2021-09-24 DIAGNOSIS — I10 Essential (primary) hypertension: Secondary | ICD-10-CM | POA: Diagnosis not present

## 2021-09-24 DIAGNOSIS — Z0000 Encounter for general adult medical examination without abnormal findings: Secondary | ICD-10-CM | POA: Diagnosis not present

## 2021-09-24 DIAGNOSIS — R82998 Other abnormal findings in urine: Secondary | ICD-10-CM | POA: Diagnosis not present

## 2021-09-24 DIAGNOSIS — Z23 Encounter for immunization: Secondary | ICD-10-CM | POA: Diagnosis not present

## 2021-09-24 DIAGNOSIS — Z1212 Encounter for screening for malignant neoplasm of rectum: Secondary | ICD-10-CM | POA: Diagnosis not present

## 2021-09-28 ENCOUNTER — Other Ambulatory Visit (HOSPITAL_COMMUNITY): Payer: Self-pay

## 2021-10-26 ENCOUNTER — Other Ambulatory Visit (HOSPITAL_COMMUNITY): Payer: Self-pay

## 2021-11-23 ENCOUNTER — Other Ambulatory Visit (HOSPITAL_COMMUNITY): Payer: Self-pay

## 2021-11-23 DIAGNOSIS — I10 Essential (primary) hypertension: Secondary | ICD-10-CM | POA: Diagnosis not present

## 2021-12-22 ENCOUNTER — Other Ambulatory Visit (HOSPITAL_COMMUNITY): Payer: Self-pay

## 2022-01-18 ENCOUNTER — Other Ambulatory Visit (HOSPITAL_COMMUNITY): Payer: Self-pay

## 2022-01-19 ENCOUNTER — Other Ambulatory Visit (HOSPITAL_COMMUNITY): Payer: Self-pay

## 2022-01-19 MED ORDER — OZEMPIC (2 MG/DOSE) 8 MG/3ML ~~LOC~~ SOPN
2.0000 mg | PEN_INJECTOR | SUBCUTANEOUS | 5 refills | Status: AC
  Filled 2022-01-19: qty 3, 28d supply, fill #0
  Filled 2022-02-11 – 2022-02-16 (×2): qty 3, 28d supply, fill #1

## 2022-02-11 ENCOUNTER — Other Ambulatory Visit (HOSPITAL_COMMUNITY): Payer: Self-pay

## 2022-02-12 ENCOUNTER — Other Ambulatory Visit (HOSPITAL_COMMUNITY): Payer: Self-pay

## 2022-02-16 ENCOUNTER — Other Ambulatory Visit (HOSPITAL_COMMUNITY): Payer: Self-pay

## 2022-03-29 DIAGNOSIS — R7301 Impaired fasting glucose: Secondary | ICD-10-CM | POA: Diagnosis not present

## 2022-03-29 DIAGNOSIS — I10 Essential (primary) hypertension: Secondary | ICD-10-CM | POA: Diagnosis not present

## 2022-04-06 ENCOUNTER — Telehealth: Payer: Self-pay | Admitting: *Deleted

## 2022-04-06 ENCOUNTER — Other Ambulatory Visit: Payer: Self-pay | Admitting: *Deleted

## 2022-04-06 NOTE — Chronic Care Management (AMB) (Signed)
  Care Coordination  Outreach Note  04/06/2022 Name: Tracy Hart MRN: [id number] DOB: [birth year]   Care Coordination Outreach Attempts  An unsuccessful telephone outreach was attempted today to offer the patient information about available care coordination services as a benefit of their health plan.   Follow Up Plan:  Additional outreach attempts will be made to offer the patient care coordination information and services.   Encounter Outcome:  No Answer  [place]  Direct Dial: [phone]

## 2022-04-12 DIAGNOSIS — M8589 Other specified disorders of bone density and structure, multiple sites: Secondary | ICD-10-CM | POA: Diagnosis not present

## 2022-04-13 NOTE — Chronic Care Management (AMB) (Signed)
  Care Coordination  Outreach Note  04/13/2022 Name: Tracy Hart MRN: [id number] DOB: 04/01/1961   Care Coordination Outreach Attempts  A second unsuccessful outreach was attempted today to offer the patient with information about available care coordination services as a benefit of their health plan.     Follow Up Plan:  Additional outreach attempts will be made to offer the patient care coordination information and services.   Encounter Outcome:  No Answer  [place]  Direct Dial: [phone]

## 2022-04-21 NOTE — Chronic Care Management (AMB) (Signed)
  Care Coordination  Outreach Note  04/21/2022 Name: Amarise Lillo MRN: [id number] DOB: [birth year]   Care Coordination Outreach Attempts  A third unsuccessful outreach was attempted today to offer the patient with information about available care coordination services as a benefit of their health plan.   Follow Up Plan:  No further outreach attempts will be made at this time. We have been unable to contact the patient to offer or enroll patient in care coordination services  Encounter Outcome:  No Answer   [place]: [phone]

## 2022-06-12 DIAGNOSIS — Z23 Encounter for immunization: Secondary | ICD-10-CM | POA: Diagnosis not present

## 2022-06-22 DIAGNOSIS — Z853 Personal history of malignant neoplasm of breast: Secondary | ICD-10-CM | POA: Diagnosis not present

## 2022-06-22 DIAGNOSIS — N6314 Unspecified lump in the right breast, lower inner quadrant: Secondary | ICD-10-CM | POA: Diagnosis not present

## 2022-06-25 ENCOUNTER — Other Ambulatory Visit (HOSPITAL_BASED_OUTPATIENT_CLINIC_OR_DEPARTMENT_OTHER): Payer: Self-pay

## 2022-06-30 ENCOUNTER — Other Ambulatory Visit: Payer: Self-pay | Admitting: Surgery

## 2022-06-30 DIAGNOSIS — N6314 Unspecified lump in the right breast, lower inner quadrant: Secondary | ICD-10-CM

## 2022-07-20 ENCOUNTER — Ambulatory Visit
Admission: RE | Admit: 2022-07-20 | Discharge: 2022-07-20 | Disposition: A | Discharge status: Home / Self Care | Payer: BC Managed Care – PPO | Source: Ambulatory Visit | Attending: Surgery | Admitting: Surgery

## 2022-07-20 ENCOUNTER — Other Ambulatory Visit: Payer: Self-pay | Admitting: Surgery

## 2022-07-20 DIAGNOSIS — N6314 Unspecified lump in the right breast, lower inner quadrant: Secondary | ICD-10-CM

## 2022-07-20 DIAGNOSIS — N6311 Unspecified lump in the right breast, upper outer quadrant: Secondary | ICD-10-CM | POA: Diagnosis not present

## 2022-07-20 DIAGNOSIS — Z853 Personal history of malignant neoplasm of breast: Secondary | ICD-10-CM | POA: Diagnosis not present

## 2022-07-27 ENCOUNTER — Ambulatory Visit
Admission: RE | Admit: 2022-07-27 | Discharge: 2022-07-27 | Disposition: A | Discharge status: Home / Self Care | Payer: BC Managed Care – PPO | Source: Ambulatory Visit | Attending: Surgery | Admitting: Surgery

## 2022-07-27 ENCOUNTER — Other Ambulatory Visit: Payer: Self-pay | Admitting: Surgery

## 2022-07-27 DIAGNOSIS — N6314 Unspecified lump in the right breast, lower inner quadrant: Secondary | ICD-10-CM

## 2022-07-27 DIAGNOSIS — N6031 Fibrosclerosis of right breast: Secondary | ICD-10-CM | POA: Diagnosis not present

## 2022-07-27 DIAGNOSIS — N6315 Unspecified lump in the right breast, overlapping quadrants: Secondary | ICD-10-CM | POA: Diagnosis not present

## 2022-07-27 HISTORY — PX: BREAST BIOPSY: SHX20

## 2022-07-28 ENCOUNTER — Other Ambulatory Visit: Payer: Self-pay | Admitting: Surgery

## 2022-07-28 DIAGNOSIS — N6459 Other signs and symptoms in breast: Secondary | ICD-10-CM | POA: Diagnosis not present

## 2022-07-28 DIAGNOSIS — L905 Scar conditions and fibrosis of skin: Secondary | ICD-10-CM | POA: Diagnosis not present

## 2022-07-28 DIAGNOSIS — Z853 Personal history of malignant neoplasm of breast: Secondary | ICD-10-CM | POA: Diagnosis not present

## 2022-07-28 DIAGNOSIS — N6312 Unspecified lump in the right breast, upper inner quadrant: Secondary | ICD-10-CM | POA: Diagnosis not present

## 2022-07-30 ENCOUNTER — Other Ambulatory Visit: Payer: Self-pay | Admitting: Surgery

## 2022-07-30 DIAGNOSIS — Z1231 Encounter for screening mammogram for malignant neoplasm of breast: Secondary | ICD-10-CM

## 2022-08-11 DIAGNOSIS — Z01419 Encounter for gynecological examination (general) (routine) without abnormal findings: Secondary | ICD-10-CM | POA: Diagnosis not present

## 2022-08-11 DIAGNOSIS — Z124 Encounter for screening for malignant neoplasm of cervix: Secondary | ICD-10-CM | POA: Diagnosis not present

## 2022-08-11 DIAGNOSIS — Z1151 Encounter for screening for human papillomavirus (HPV): Secondary | ICD-10-CM | POA: Diagnosis not present

## 2022-08-11 DIAGNOSIS — Z6823 Body mass index (BMI) 23.0-23.9, adult: Secondary | ICD-10-CM | POA: Diagnosis not present

## 2022-09-23 ENCOUNTER — Ambulatory Visit: Payer: BC Managed Care – PPO

## 2022-10-08 DIAGNOSIS — Z1212 Encounter for screening for malignant neoplasm of rectum: Secondary | ICD-10-CM | POA: Diagnosis not present

## 2022-11-05 ENCOUNTER — Ambulatory Visit
Admission: RE | Admit: 2022-11-05 | Discharge: 2022-11-05 | Disposition: A | Discharge status: Home / Self Care | Payer: Managed Care, Other (non HMO) | Source: Ambulatory Visit | Attending: Surgery | Admitting: Surgery

## 2022-11-05 DIAGNOSIS — Z1231 Encounter for screening mammogram for malignant neoplasm of breast: Secondary | ICD-10-CM

## 2022-11-10 ENCOUNTER — Other Ambulatory Visit: Payer: Self-pay | Admitting: Surgery

## 2022-11-10 DIAGNOSIS — R928 Other abnormal and inconclusive findings on diagnostic imaging of breast: Secondary | ICD-10-CM

## 2022-11-22 ENCOUNTER — Ambulatory Visit
Admission: RE | Admit: 2022-11-22 | Discharge: 2022-11-22 | Disposition: A | Discharge status: Home / Self Care | Payer: Managed Care, Other (non HMO) | Source: Ambulatory Visit | Attending: Surgery | Admitting: Surgery

## 2022-11-22 DIAGNOSIS — R928 Other abnormal and inconclusive findings on diagnostic imaging of breast: Secondary | ICD-10-CM
